# Patient Record
Sex: Female | Born: 1955
Health system: Southern US, Community
[De-identification: ages and names within clinical notes are randomized; demographics above are authoritative.]

## PROBLEM LIST (undated history)

## (undated) DIAGNOSIS — K219 Gastro-esophageal reflux disease without esophagitis: Secondary | ICD-10-CM

## (undated) DIAGNOSIS — Z9889 Other specified postprocedural states: Secondary | ICD-10-CM

## (undated) DIAGNOSIS — N3281 Overactive bladder: Secondary | ICD-10-CM

## (undated) DIAGNOSIS — G4700 Insomnia, unspecified: Secondary | ICD-10-CM

## (undated) DIAGNOSIS — Z8619 Personal history of other infectious and parasitic diseases: Secondary | ICD-10-CM

## (undated) DIAGNOSIS — E119 Type 2 diabetes mellitus without complications: Secondary | ICD-10-CM

## (undated) DIAGNOSIS — R51 Headache: Secondary | ICD-10-CM

## (undated) DIAGNOSIS — I509 Heart failure, unspecified: Secondary | ICD-10-CM

## (undated) DIAGNOSIS — K759 Inflammatory liver disease, unspecified: Secondary | ICD-10-CM

## (undated) DIAGNOSIS — R0600 Dyspnea, unspecified: Secondary | ICD-10-CM

## (undated) DIAGNOSIS — M797 Fibromyalgia: Secondary | ICD-10-CM

## (undated) DIAGNOSIS — F32A Depression, unspecified: Secondary | ICD-10-CM

## (undated) DIAGNOSIS — R011 Cardiac murmur, unspecified: Secondary | ICD-10-CM

## (undated) DIAGNOSIS — Z8614 Personal history of Methicillin resistant Staphylococcus aureus infection: Secondary | ICD-10-CM

## (undated) DIAGNOSIS — M1990 Unspecified osteoarthritis, unspecified site: Secondary | ICD-10-CM

## (undated) DIAGNOSIS — R519 Headache, unspecified: Secondary | ICD-10-CM

## (undated) DIAGNOSIS — J449 Chronic obstructive pulmonary disease, unspecified: Secondary | ICD-10-CM

## (undated) DIAGNOSIS — R112 Nausea with vomiting, unspecified: Secondary | ICD-10-CM

## (undated) DIAGNOSIS — J189 Pneumonia, unspecified organism: Secondary | ICD-10-CM

## (undated) DIAGNOSIS — F329 Major depressive disorder, single episode, unspecified: Secondary | ICD-10-CM

## (undated) DIAGNOSIS — F419 Anxiety disorder, unspecified: Secondary | ICD-10-CM

## (undated) DIAGNOSIS — D649 Anemia, unspecified: Secondary | ICD-10-CM

## (undated) HISTORY — DX: Type 2 diabetes mellitus without complications: E11.9

## (undated) HISTORY — PX: HEMORRHOID SURGERY: SHX153

## (undated) HISTORY — PX: FEMUR FRACTURE SURGERY: SHX633

## (undated) HISTORY — DX: Anxiety disorder, unspecified: F41.9

## (undated) HISTORY — PX: APPENDECTOMY: SHX54

## (undated) HISTORY — DX: Major depressive disorder, single episode, unspecified: F32.9

## (undated) HISTORY — DX: Heart failure, unspecified: I50.9

## (undated) HISTORY — DX: Personal history of other infectious and parasitic diseases: Z86.19

## (undated) HISTORY — DX: Insomnia, unspecified: G47.00

## (undated) HISTORY — DX: Overactive bladder: N32.81

## (undated) HISTORY — PX: OTHER SURGICAL HISTORY: SHX169

## (undated) HISTORY — PX: NASAL SINUS SURGERY: SHX719

## (undated) HISTORY — DX: Depression, unspecified: F32.A

## (undated) HISTORY — PX: INNER EAR SURGERY: SHX679

## (undated) HISTORY — DX: Personal history of Methicillin resistant Staphylococcus aureus infection: Z86.14

## (undated) HISTORY — PX: DIAGNOSTIC LAPAROSCOPY: SUR761

## (undated) HISTORY — PX: BACK SURGERY: SHX140

## (undated) HISTORY — DX: Chronic obstructive pulmonary disease, unspecified: J44.9

## (undated) HISTORY — DX: Gastro-esophageal reflux disease without esophagitis: K21.9

---

## 2016-09-15 DIAGNOSIS — J9691 Respiratory failure, unspecified with hypoxia: Secondary | ICD-10-CM | POA: Diagnosis not present

## 2016-09-15 DIAGNOSIS — J9602 Acute respiratory failure with hypercapnia: Secondary | ICD-10-CM | POA: Diagnosis not present

## 2016-09-15 DIAGNOSIS — J441 Chronic obstructive pulmonary disease with (acute) exacerbation: Secondary | ICD-10-CM | POA: Diagnosis not present

## 2016-09-15 DIAGNOSIS — R0600 Dyspnea, unspecified: Secondary | ICD-10-CM | POA: Diagnosis not present

## 2016-09-15 DIAGNOSIS — E871 Hypo-osmolality and hyponatremia: Secondary | ICD-10-CM | POA: Diagnosis not present

## 2016-09-15 DIAGNOSIS — K529 Noninfective gastroenteritis and colitis, unspecified: Secondary | ICD-10-CM | POA: Diagnosis not present

## 2016-09-15 DIAGNOSIS — N390 Urinary tract infection, site not specified: Secondary | ICD-10-CM | POA: Diagnosis not present

## 2016-09-16 DIAGNOSIS — T8451XA Infection and inflammatory reaction due to internal right hip prosthesis, initial encounter: Secondary | ICD-10-CM | POA: Diagnosis not present

## 2016-09-16 DIAGNOSIS — R1310 Dysphagia, unspecified: Secondary | ICD-10-CM | POA: Diagnosis present

## 2016-09-16 DIAGNOSIS — G9340 Encephalopathy, unspecified: Secondary | ICD-10-CM | POA: Diagnosis not present

## 2016-09-16 DIAGNOSIS — Z9889 Other specified postprocedural states: Secondary | ICD-10-CM | POA: Diagnosis not present

## 2016-09-16 DIAGNOSIS — D62 Acute posthemorrhagic anemia: Secondary | ICD-10-CM | POA: Diagnosis not present

## 2016-09-16 DIAGNOSIS — F17210 Nicotine dependence, cigarettes, uncomplicated: Secondary | ICD-10-CM | POA: Diagnosis present

## 2016-09-16 DIAGNOSIS — R4182 Altered mental status, unspecified: Secondary | ICD-10-CM | POA: Diagnosis present

## 2016-09-16 DIAGNOSIS — R1312 Dysphagia, oropharyngeal phase: Secondary | ICD-10-CM | POA: Diagnosis not present

## 2016-09-16 DIAGNOSIS — F329 Major depressive disorder, single episode, unspecified: Secondary | ICD-10-CM | POA: Diagnosis present

## 2016-09-16 DIAGNOSIS — R2689 Other abnormalities of gait and mobility: Secondary | ICD-10-CM | POA: Diagnosis not present

## 2016-09-16 DIAGNOSIS — R0600 Dyspnea, unspecified: Secondary | ICD-10-CM | POA: Diagnosis not present

## 2016-09-16 DIAGNOSIS — R279 Unspecified lack of coordination: Secondary | ICD-10-CM | POA: Diagnosis not present

## 2016-09-16 DIAGNOSIS — Z7409 Other reduced mobility: Secondary | ICD-10-CM | POA: Diagnosis not present

## 2016-09-16 DIAGNOSIS — M6281 Muscle weakness (generalized): Secondary | ICD-10-CM | POA: Diagnosis not present

## 2016-09-16 DIAGNOSIS — J309 Allergic rhinitis, unspecified: Secondary | ICD-10-CM | POA: Diagnosis not present

## 2016-09-16 DIAGNOSIS — J9601 Acute respiratory failure with hypoxia: Secondary | ICD-10-CM | POA: Diagnosis not present

## 2016-09-16 DIAGNOSIS — R262 Difficulty in walking, not elsewhere classified: Secondary | ICD-10-CM | POA: Diagnosis not present

## 2016-09-16 DIAGNOSIS — Z743 Need for continuous supervision: Secondary | ICD-10-CM | POA: Diagnosis not present

## 2016-09-16 DIAGNOSIS — J9691 Respiratory failure, unspecified with hypoxia: Secondary | ICD-10-CM | POA: Diagnosis present

## 2016-09-16 DIAGNOSIS — N390 Urinary tract infection, site not specified: Secondary | ICD-10-CM | POA: Diagnosis present

## 2016-09-16 DIAGNOSIS — E871 Hypo-osmolality and hyponatremia: Secondary | ICD-10-CM | POA: Diagnosis present

## 2016-09-16 DIAGNOSIS — F419 Anxiety disorder, unspecified: Secondary | ICD-10-CM | POA: Diagnosis present

## 2016-09-16 DIAGNOSIS — J9602 Acute respiratory failure with hypercapnia: Secondary | ICD-10-CM | POA: Diagnosis present

## 2016-09-16 DIAGNOSIS — A0471 Enterocolitis due to Clostridium difficile, recurrent: Secondary | ICD-10-CM | POA: Diagnosis not present

## 2016-09-16 DIAGNOSIS — K529 Noninfective gastroenteritis and colitis, unspecified: Secondary | ICD-10-CM | POA: Diagnosis present

## 2016-09-16 DIAGNOSIS — E876 Hypokalemia: Secondary | ICD-10-CM | POA: Diagnosis present

## 2016-09-16 DIAGNOSIS — N3289 Other specified disorders of bladder: Secondary | ICD-10-CM | POA: Diagnosis not present

## 2016-09-16 DIAGNOSIS — E8770 Fluid overload, unspecified: Secondary | ICD-10-CM | POA: Diagnosis present

## 2016-09-16 DIAGNOSIS — T814XXA Infection following a procedure, initial encounter: Secondary | ICD-10-CM | POA: Diagnosis not present

## 2016-09-16 DIAGNOSIS — J189 Pneumonia, unspecified organism: Secondary | ICD-10-CM | POA: Diagnosis not present

## 2016-09-16 DIAGNOSIS — M25551 Pain in right hip: Secondary | ICD-10-CM | POA: Diagnosis not present

## 2016-09-16 DIAGNOSIS — J441 Chronic obstructive pulmonary disease with (acute) exacerbation: Secondary | ICD-10-CM | POA: Diagnosis present

## 2016-09-16 DIAGNOSIS — I509 Heart failure, unspecified: Secondary | ICD-10-CM | POA: Diagnosis not present

## 2016-09-16 DIAGNOSIS — F339 Major depressive disorder, recurrent, unspecified: Secondary | ICD-10-CM | POA: Diagnosis not present

## 2016-09-16 DIAGNOSIS — R293 Abnormal posture: Secondary | ICD-10-CM | POA: Diagnosis not present

## 2016-09-16 DIAGNOSIS — M62838 Other muscle spasm: Secondary | ICD-10-CM | POA: Diagnosis not present

## 2016-09-16 DIAGNOSIS — G4700 Insomnia, unspecified: Secondary | ICD-10-CM | POA: Diagnosis not present

## 2016-09-16 DIAGNOSIS — Z741 Need for assistance with personal care: Secondary | ICD-10-CM | POA: Diagnosis not present

## 2016-09-16 DIAGNOSIS — R278 Other lack of coordination: Secondary | ICD-10-CM | POA: Diagnosis not present

## 2016-09-16 DIAGNOSIS — G8929 Other chronic pain: Secondary | ICD-10-CM | POA: Diagnosis present

## 2016-09-16 DIAGNOSIS — K219 Gastro-esophageal reflux disease without esophagitis: Secondary | ICD-10-CM | POA: Diagnosis present

## 2016-09-16 DIAGNOSIS — Z8614 Personal history of Methicillin resistant Staphylococcus aureus infection: Secondary | ICD-10-CM | POA: Diagnosis not present

## 2016-09-21 DIAGNOSIS — Z7409 Other reduced mobility: Secondary | ICD-10-CM | POA: Diagnosis not present

## 2016-09-21 DIAGNOSIS — R1312 Dysphagia, oropharyngeal phase: Secondary | ICD-10-CM | POA: Diagnosis not present

## 2016-09-21 DIAGNOSIS — F339 Major depressive disorder, recurrent, unspecified: Secondary | ICD-10-CM | POA: Diagnosis not present

## 2016-09-21 DIAGNOSIS — R262 Difficulty in walking, not elsewhere classified: Secondary | ICD-10-CM | POA: Diagnosis not present

## 2016-09-21 DIAGNOSIS — G4700 Insomnia, unspecified: Secondary | ICD-10-CM | POA: Diagnosis not present

## 2016-09-21 DIAGNOSIS — T8451XA Infection and inflammatory reaction due to internal right hip prosthesis, initial encounter: Secondary | ICD-10-CM | POA: Diagnosis not present

## 2016-09-21 DIAGNOSIS — A0471 Enterocolitis due to Clostridium difficile, recurrent: Secondary | ICD-10-CM | POA: Diagnosis not present

## 2016-09-21 DIAGNOSIS — M6281 Muscle weakness (generalized): Secondary | ICD-10-CM | POA: Diagnosis not present

## 2016-09-21 DIAGNOSIS — J189 Pneumonia, unspecified organism: Secondary | ICD-10-CM | POA: Diagnosis not present

## 2016-09-21 DIAGNOSIS — E876 Hypokalemia: Secondary | ICD-10-CM | POA: Diagnosis not present

## 2016-09-21 DIAGNOSIS — M62838 Other muscle spasm: Secondary | ICD-10-CM | POA: Diagnosis not present

## 2016-09-21 DIAGNOSIS — R279 Unspecified lack of coordination: Secondary | ICD-10-CM | POA: Diagnosis not present

## 2016-09-21 DIAGNOSIS — J9601 Acute respiratory failure with hypoxia: Secondary | ICD-10-CM | POA: Diagnosis not present

## 2016-09-21 DIAGNOSIS — D62 Acute posthemorrhagic anemia: Secondary | ICD-10-CM | POA: Diagnosis not present

## 2016-09-21 DIAGNOSIS — M25551 Pain in right hip: Secondary | ICD-10-CM | POA: Diagnosis not present

## 2016-09-21 DIAGNOSIS — Z743 Need for continuous supervision: Secondary | ICD-10-CM | POA: Diagnosis not present

## 2016-09-21 DIAGNOSIS — Z741 Need for assistance with personal care: Secondary | ICD-10-CM | POA: Diagnosis not present

## 2016-09-21 DIAGNOSIS — N3289 Other specified disorders of bladder: Secondary | ICD-10-CM | POA: Diagnosis not present

## 2016-09-21 DIAGNOSIS — F419 Anxiety disorder, unspecified: Secondary | ICD-10-CM | POA: Diagnosis not present

## 2016-09-21 DIAGNOSIS — R293 Abnormal posture: Secondary | ICD-10-CM | POA: Diagnosis not present

## 2016-09-21 DIAGNOSIS — G9340 Encephalopathy, unspecified: Secondary | ICD-10-CM | POA: Diagnosis not present

## 2016-09-21 DIAGNOSIS — R1310 Dysphagia, unspecified: Secondary | ICD-10-CM | POA: Diagnosis not present

## 2016-09-21 DIAGNOSIS — I509 Heart failure, unspecified: Secondary | ICD-10-CM | POA: Diagnosis not present

## 2016-09-21 DIAGNOSIS — G8929 Other chronic pain: Secondary | ICD-10-CM | POA: Diagnosis not present

## 2016-09-21 DIAGNOSIS — K219 Gastro-esophageal reflux disease without esophagitis: Secondary | ICD-10-CM | POA: Diagnosis not present

## 2016-09-21 DIAGNOSIS — J441 Chronic obstructive pulmonary disease with (acute) exacerbation: Secondary | ICD-10-CM | POA: Diagnosis not present

## 2016-09-21 DIAGNOSIS — T814XXA Infection following a procedure, initial encounter: Secondary | ICD-10-CM | POA: Diagnosis not present

## 2016-09-21 DIAGNOSIS — R278 Other lack of coordination: Secondary | ICD-10-CM | POA: Diagnosis not present

## 2016-09-21 DIAGNOSIS — R2689 Other abnormalities of gait and mobility: Secondary | ICD-10-CM | POA: Diagnosis not present

## 2016-09-21 DIAGNOSIS — J309 Allergic rhinitis, unspecified: Secondary | ICD-10-CM | POA: Diagnosis not present

## 2016-09-26 DIAGNOSIS — M6281 Muscle weakness (generalized): Secondary | ICD-10-CM | POA: Diagnosis not present

## 2016-10-17 DIAGNOSIS — K219 Gastro-esophageal reflux disease without esophagitis: Secondary | ICD-10-CM | POA: Diagnosis not present

## 2016-10-17 DIAGNOSIS — R0602 Shortness of breath: Secondary | ICD-10-CM | POA: Diagnosis not present

## 2016-10-17 DIAGNOSIS — F331 Major depressive disorder, recurrent, moderate: Secondary | ICD-10-CM | POA: Diagnosis not present

## 2016-10-17 DIAGNOSIS — D649 Anemia, unspecified: Secondary | ICD-10-CM | POA: Diagnosis not present

## 2016-10-17 DIAGNOSIS — G8929 Other chronic pain: Secondary | ICD-10-CM | POA: Diagnosis not present

## 2016-10-17 DIAGNOSIS — F5101 Primary insomnia: Secondary | ICD-10-CM | POA: Diagnosis not present

## 2016-10-17 DIAGNOSIS — R112 Nausea with vomiting, unspecified: Secondary | ICD-10-CM | POA: Diagnosis not present

## 2016-10-17 DIAGNOSIS — M62838 Other muscle spasm: Secondary | ICD-10-CM | POA: Diagnosis not present

## 2016-10-17 DIAGNOSIS — F419 Anxiety disorder, unspecified: Secondary | ICD-10-CM | POA: Diagnosis not present

## 2016-10-17 DIAGNOSIS — R609 Edema, unspecified: Secondary | ICD-10-CM | POA: Diagnosis not present

## 2016-10-23 DIAGNOSIS — G894 Chronic pain syndrome: Secondary | ICD-10-CM | POA: Diagnosis not present

## 2016-10-30 DIAGNOSIS — G894 Chronic pain syndrome: Secondary | ICD-10-CM | POA: Diagnosis not present

## 2016-11-01 DIAGNOSIS — F331 Major depressive disorder, recurrent, moderate: Secondary | ICD-10-CM | POA: Diagnosis not present

## 2016-11-01 DIAGNOSIS — F17210 Nicotine dependence, cigarettes, uncomplicated: Secondary | ICD-10-CM | POA: Diagnosis not present

## 2016-11-01 DIAGNOSIS — F5101 Primary insomnia: Secondary | ICD-10-CM | POA: Diagnosis not present

## 2016-11-01 DIAGNOSIS — F419 Anxiety disorder, unspecified: Secondary | ICD-10-CM | POA: Diagnosis not present

## 2016-11-02 DIAGNOSIS — H906 Mixed conductive and sensorineural hearing loss, bilateral: Secondary | ICD-10-CM | POA: Diagnosis not present

## 2016-11-02 DIAGNOSIS — J310 Chronic rhinitis: Secondary | ICD-10-CM | POA: Diagnosis not present

## 2016-11-02 DIAGNOSIS — H6593 Unspecified nonsuppurative otitis media, bilateral: Secondary | ICD-10-CM | POA: Diagnosis not present

## 2016-11-02 DIAGNOSIS — H9203 Otalgia, bilateral: Secondary | ICD-10-CM | POA: Diagnosis not present

## 2016-11-05 DIAGNOSIS — G894 Chronic pain syndrome: Secondary | ICD-10-CM | POA: Diagnosis not present

## 2016-11-13 DIAGNOSIS — F17210 Nicotine dependence, cigarettes, uncomplicated: Secondary | ICD-10-CM | POA: Diagnosis not present

## 2016-11-13 DIAGNOSIS — G8929 Other chronic pain: Secondary | ICD-10-CM | POA: Diagnosis not present

## 2016-11-13 DIAGNOSIS — R609 Edema, unspecified: Secondary | ICD-10-CM | POA: Diagnosis not present

## 2016-11-13 DIAGNOSIS — R112 Nausea with vomiting, unspecified: Secondary | ICD-10-CM | POA: Diagnosis not present

## 2016-11-13 DIAGNOSIS — F5101 Primary insomnia: Secondary | ICD-10-CM | POA: Diagnosis not present

## 2016-11-13 DIAGNOSIS — M62838 Other muscle spasm: Secondary | ICD-10-CM | POA: Diagnosis not present

## 2016-11-13 DIAGNOSIS — D649 Anemia, unspecified: Secondary | ICD-10-CM | POA: Diagnosis not present

## 2016-11-13 DIAGNOSIS — F331 Major depressive disorder, recurrent, moderate: Secondary | ICD-10-CM | POA: Diagnosis not present

## 2016-11-13 DIAGNOSIS — F419 Anxiety disorder, unspecified: Secondary | ICD-10-CM | POA: Diagnosis not present

## 2016-11-13 DIAGNOSIS — K219 Gastro-esophageal reflux disease without esophagitis: Secondary | ICD-10-CM | POA: Diagnosis not present

## 2016-11-13 DIAGNOSIS — R0602 Shortness of breath: Secondary | ICD-10-CM | POA: Diagnosis not present

## 2016-11-14 DIAGNOSIS — R0600 Dyspnea, unspecified: Secondary | ICD-10-CM | POA: Diagnosis not present

## 2016-11-27 DIAGNOSIS — G894 Chronic pain syndrome: Secondary | ICD-10-CM | POA: Diagnosis not present

## 2016-12-07 DIAGNOSIS — G894 Chronic pain syndrome: Secondary | ICD-10-CM | POA: Diagnosis not present

## 2016-12-13 DIAGNOSIS — R0602 Shortness of breath: Secondary | ICD-10-CM | POA: Diagnosis not present

## 2016-12-13 DIAGNOSIS — F321 Major depressive disorder, single episode, moderate: Secondary | ICD-10-CM | POA: Diagnosis not present

## 2016-12-13 DIAGNOSIS — F331 Major depressive disorder, recurrent, moderate: Secondary | ICD-10-CM | POA: Diagnosis not present

## 2016-12-13 DIAGNOSIS — F419 Anxiety disorder, unspecified: Secondary | ICD-10-CM | POA: Diagnosis not present

## 2016-12-13 DIAGNOSIS — K219 Gastro-esophageal reflux disease without esophagitis: Secondary | ICD-10-CM | POA: Diagnosis not present

## 2016-12-13 DIAGNOSIS — G8929 Other chronic pain: Secondary | ICD-10-CM | POA: Diagnosis not present

## 2016-12-13 DIAGNOSIS — M62838 Other muscle spasm: Secondary | ICD-10-CM | POA: Diagnosis not present

## 2016-12-13 DIAGNOSIS — J441 Chronic obstructive pulmonary disease with (acute) exacerbation: Secondary | ICD-10-CM | POA: Diagnosis not present

## 2016-12-13 DIAGNOSIS — J4521 Mild intermittent asthma with (acute) exacerbation: Secondary | ICD-10-CM | POA: Diagnosis not present

## 2016-12-13 DIAGNOSIS — D649 Anemia, unspecified: Secondary | ICD-10-CM | POA: Diagnosis not present

## 2016-12-13 DIAGNOSIS — F5101 Primary insomnia: Secondary | ICD-10-CM | POA: Diagnosis not present

## 2016-12-13 DIAGNOSIS — F17210 Nicotine dependence, cigarettes, uncomplicated: Secondary | ICD-10-CM | POA: Diagnosis not present

## 2016-12-14 DIAGNOSIS — H906 Mixed conductive and sensorineural hearing loss, bilateral: Secondary | ICD-10-CM | POA: Diagnosis not present

## 2016-12-14 DIAGNOSIS — J310 Chronic rhinitis: Secondary | ICD-10-CM | POA: Diagnosis not present

## 2016-12-14 DIAGNOSIS — H6593 Unspecified nonsuppurative otitis media, bilateral: Secondary | ICD-10-CM | POA: Diagnosis not present

## 2016-12-14 DIAGNOSIS — H9203 Otalgia, bilateral: Secondary | ICD-10-CM | POA: Diagnosis not present

## 2016-12-21 DIAGNOSIS — G894 Chronic pain syndrome: Secondary | ICD-10-CM | POA: Diagnosis not present

## 2016-12-27 DIAGNOSIS — H9203 Otalgia, bilateral: Secondary | ICD-10-CM | POA: Diagnosis not present

## 2016-12-27 DIAGNOSIS — H6983 Other specified disorders of Eustachian tube, bilateral: Secondary | ICD-10-CM | POA: Diagnosis not present

## 2016-12-27 DIAGNOSIS — Z720 Tobacco use: Secondary | ICD-10-CM | POA: Diagnosis not present

## 2017-01-01 DIAGNOSIS — G894 Chronic pain syndrome: Secondary | ICD-10-CM | POA: Diagnosis not present

## 2017-01-07 DIAGNOSIS — Z79899 Other long term (current) drug therapy: Secondary | ICD-10-CM | POA: Diagnosis not present

## 2017-01-12 DIAGNOSIS — R0602 Shortness of breath: Secondary | ICD-10-CM | POA: Diagnosis not present

## 2017-01-12 DIAGNOSIS — M62838 Other muscle spasm: Secondary | ICD-10-CM | POA: Diagnosis not present

## 2017-01-12 DIAGNOSIS — F331 Major depressive disorder, recurrent, moderate: Secondary | ICD-10-CM | POA: Diagnosis not present

## 2017-01-12 DIAGNOSIS — J4521 Mild intermittent asthma with (acute) exacerbation: Secondary | ICD-10-CM | POA: Diagnosis not present

## 2017-01-12 DIAGNOSIS — G8929 Other chronic pain: Secondary | ICD-10-CM | POA: Diagnosis not present

## 2017-01-12 DIAGNOSIS — D649 Anemia, unspecified: Secondary | ICD-10-CM | POA: Diagnosis not present

## 2017-01-12 DIAGNOSIS — F5101 Primary insomnia: Secondary | ICD-10-CM | POA: Diagnosis not present

## 2017-01-12 DIAGNOSIS — F321 Major depressive disorder, single episode, moderate: Secondary | ICD-10-CM | POA: Diagnosis not present

## 2017-01-12 DIAGNOSIS — K219 Gastro-esophageal reflux disease without esophagitis: Secondary | ICD-10-CM | POA: Diagnosis not present

## 2017-01-12 DIAGNOSIS — J441 Chronic obstructive pulmonary disease with (acute) exacerbation: Secondary | ICD-10-CM | POA: Diagnosis not present

## 2017-01-12 DIAGNOSIS — F17210 Nicotine dependence, cigarettes, uncomplicated: Secondary | ICD-10-CM | POA: Diagnosis not present

## 2017-01-12 DIAGNOSIS — F419 Anxiety disorder, unspecified: Secondary | ICD-10-CM | POA: Diagnosis not present

## 2017-01-16 DIAGNOSIS — G894 Chronic pain syndrome: Secondary | ICD-10-CM | POA: Diagnosis not present

## 2017-01-29 DIAGNOSIS — G894 Chronic pain syndrome: Secondary | ICD-10-CM | POA: Diagnosis not present

## 2017-02-07 DIAGNOSIS — F419 Anxiety disorder, unspecified: Secondary | ICD-10-CM | POA: Diagnosis not present

## 2017-02-07 DIAGNOSIS — F5101 Primary insomnia: Secondary | ICD-10-CM | POA: Diagnosis not present

## 2017-02-07 DIAGNOSIS — F17210 Nicotine dependence, cigarettes, uncomplicated: Secondary | ICD-10-CM | POA: Diagnosis not present

## 2017-02-07 DIAGNOSIS — F331 Major depressive disorder, recurrent, moderate: Secondary | ICD-10-CM | POA: Diagnosis not present

## 2017-02-13 DIAGNOSIS — F419 Anxiety disorder, unspecified: Secondary | ICD-10-CM | POA: Diagnosis not present

## 2017-02-13 DIAGNOSIS — F17210 Nicotine dependence, cigarettes, uncomplicated: Secondary | ICD-10-CM | POA: Diagnosis not present

## 2017-02-13 DIAGNOSIS — M62838 Other muscle spasm: Secondary | ICD-10-CM | POA: Diagnosis not present

## 2017-02-13 DIAGNOSIS — F331 Major depressive disorder, recurrent, moderate: Secondary | ICD-10-CM | POA: Diagnosis not present

## 2017-02-13 DIAGNOSIS — G8929 Other chronic pain: Secondary | ICD-10-CM | POA: Diagnosis not present

## 2017-02-13 DIAGNOSIS — R0602 Shortness of breath: Secondary | ICD-10-CM | POA: Diagnosis not present

## 2017-02-13 DIAGNOSIS — F321 Major depressive disorder, single episode, moderate: Secondary | ICD-10-CM | POA: Diagnosis not present

## 2017-02-13 DIAGNOSIS — J4521 Mild intermittent asthma with (acute) exacerbation: Secondary | ICD-10-CM | POA: Diagnosis not present

## 2017-02-13 DIAGNOSIS — K219 Gastro-esophageal reflux disease without esophagitis: Secondary | ICD-10-CM | POA: Diagnosis not present

## 2017-02-13 DIAGNOSIS — F5101 Primary insomnia: Secondary | ICD-10-CM | POA: Diagnosis not present

## 2017-02-13 DIAGNOSIS — J441 Chronic obstructive pulmonary disease with (acute) exacerbation: Secondary | ICD-10-CM | POA: Diagnosis not present

## 2017-02-13 DIAGNOSIS — D649 Anemia, unspecified: Secondary | ICD-10-CM | POA: Diagnosis not present

## 2017-02-25 DIAGNOSIS — G894 Chronic pain syndrome: Secondary | ICD-10-CM | POA: Diagnosis not present

## 2017-03-08 DIAGNOSIS — M6281 Muscle weakness (generalized): Secondary | ICD-10-CM | POA: Diagnosis not present

## 2017-03-14 DIAGNOSIS — G8929 Other chronic pain: Secondary | ICD-10-CM | POA: Diagnosis not present

## 2017-03-14 DIAGNOSIS — F331 Major depressive disorder, recurrent, moderate: Secondary | ICD-10-CM | POA: Diagnosis not present

## 2017-03-14 DIAGNOSIS — J4521 Mild intermittent asthma with (acute) exacerbation: Secondary | ICD-10-CM | POA: Diagnosis not present

## 2017-03-14 DIAGNOSIS — F419 Anxiety disorder, unspecified: Secondary | ICD-10-CM | POA: Diagnosis not present

## 2017-03-14 DIAGNOSIS — F17210 Nicotine dependence, cigarettes, uncomplicated: Secondary | ICD-10-CM | POA: Diagnosis not present

## 2017-03-14 DIAGNOSIS — K219 Gastro-esophageal reflux disease without esophagitis: Secondary | ICD-10-CM | POA: Diagnosis not present

## 2017-03-14 DIAGNOSIS — F321 Major depressive disorder, single episode, moderate: Secondary | ICD-10-CM | POA: Diagnosis not present

## 2017-03-14 DIAGNOSIS — J441 Chronic obstructive pulmonary disease with (acute) exacerbation: Secondary | ICD-10-CM | POA: Diagnosis not present

## 2017-03-14 DIAGNOSIS — R0602 Shortness of breath: Secondary | ICD-10-CM | POA: Diagnosis not present

## 2017-03-14 DIAGNOSIS — D649 Anemia, unspecified: Secondary | ICD-10-CM | POA: Diagnosis not present

## 2017-03-14 DIAGNOSIS — F5101 Primary insomnia: Secondary | ICD-10-CM | POA: Diagnosis not present

## 2017-03-14 DIAGNOSIS — M62838 Other muscle spasm: Secondary | ICD-10-CM | POA: Diagnosis not present

## 2017-03-29 DIAGNOSIS — F5101 Primary insomnia: Secondary | ICD-10-CM | POA: Diagnosis not present

## 2017-03-29 DIAGNOSIS — F17210 Nicotine dependence, cigarettes, uncomplicated: Secondary | ICD-10-CM | POA: Diagnosis not present

## 2017-03-29 DIAGNOSIS — F331 Major depressive disorder, recurrent, moderate: Secondary | ICD-10-CM | POA: Diagnosis not present

## 2017-03-29 DIAGNOSIS — F419 Anxiety disorder, unspecified: Secondary | ICD-10-CM | POA: Diagnosis not present

## 2017-04-02 DIAGNOSIS — D509 Iron deficiency anemia, unspecified: Secondary | ICD-10-CM | POA: Diagnosis not present

## 2017-04-02 DIAGNOSIS — Z79899 Other long term (current) drug therapy: Secondary | ICD-10-CM | POA: Diagnosis not present

## 2017-04-04 DIAGNOSIS — F5101 Primary insomnia: Secondary | ICD-10-CM | POA: Diagnosis not present

## 2017-04-04 DIAGNOSIS — F331 Major depressive disorder, recurrent, moderate: Secondary | ICD-10-CM | POA: Diagnosis not present

## 2017-04-04 DIAGNOSIS — F17210 Nicotine dependence, cigarettes, uncomplicated: Secondary | ICD-10-CM | POA: Diagnosis not present

## 2017-04-04 DIAGNOSIS — F419 Anxiety disorder, unspecified: Secondary | ICD-10-CM | POA: Diagnosis not present

## 2017-04-12 DIAGNOSIS — F331 Major depressive disorder, recurrent, moderate: Secondary | ICD-10-CM | POA: Diagnosis not present

## 2017-04-12 DIAGNOSIS — F419 Anxiety disorder, unspecified: Secondary | ICD-10-CM | POA: Diagnosis not present

## 2017-04-12 DIAGNOSIS — F5101 Primary insomnia: Secondary | ICD-10-CM | POA: Diagnosis not present

## 2017-04-12 DIAGNOSIS — F17210 Nicotine dependence, cigarettes, uncomplicated: Secondary | ICD-10-CM | POA: Diagnosis not present

## 2017-04-15 DIAGNOSIS — F17210 Nicotine dependence, cigarettes, uncomplicated: Secondary | ICD-10-CM | POA: Diagnosis not present

## 2017-04-15 DIAGNOSIS — F321 Major depressive disorder, single episode, moderate: Secondary | ICD-10-CM | POA: Diagnosis not present

## 2017-04-15 DIAGNOSIS — R0602 Shortness of breath: Secondary | ICD-10-CM | POA: Diagnosis not present

## 2017-04-15 DIAGNOSIS — K219 Gastro-esophageal reflux disease without esophagitis: Secondary | ICD-10-CM | POA: Diagnosis not present

## 2017-04-15 DIAGNOSIS — J4521 Mild intermittent asthma with (acute) exacerbation: Secondary | ICD-10-CM | POA: Diagnosis not present

## 2017-04-15 DIAGNOSIS — F331 Major depressive disorder, recurrent, moderate: Secondary | ICD-10-CM | POA: Diagnosis not present

## 2017-04-15 DIAGNOSIS — J441 Chronic obstructive pulmonary disease with (acute) exacerbation: Secondary | ICD-10-CM | POA: Diagnosis not present

## 2017-04-15 DIAGNOSIS — F419 Anxiety disorder, unspecified: Secondary | ICD-10-CM | POA: Diagnosis not present

## 2017-04-15 DIAGNOSIS — M62838 Other muscle spasm: Secondary | ICD-10-CM | POA: Diagnosis not present

## 2017-04-15 DIAGNOSIS — G8929 Other chronic pain: Secondary | ICD-10-CM | POA: Diagnosis not present

## 2017-04-15 DIAGNOSIS — F5101 Primary insomnia: Secondary | ICD-10-CM | POA: Diagnosis not present

## 2017-04-15 DIAGNOSIS — D649 Anemia, unspecified: Secondary | ICD-10-CM | POA: Diagnosis not present

## 2017-04-19 DIAGNOSIS — F5101 Primary insomnia: Secondary | ICD-10-CM | POA: Diagnosis not present

## 2017-04-19 DIAGNOSIS — F419 Anxiety disorder, unspecified: Secondary | ICD-10-CM | POA: Diagnosis not present

## 2017-04-19 DIAGNOSIS — F331 Major depressive disorder, recurrent, moderate: Secondary | ICD-10-CM | POA: Diagnosis not present

## 2017-04-19 DIAGNOSIS — F17210 Nicotine dependence, cigarettes, uncomplicated: Secondary | ICD-10-CM | POA: Diagnosis not present

## 2017-04-25 DIAGNOSIS — N390 Urinary tract infection, site not specified: Secondary | ICD-10-CM | POA: Diagnosis not present

## 2017-04-25 DIAGNOSIS — R300 Dysuria: Secondary | ICD-10-CM | POA: Diagnosis not present

## 2017-04-26 DIAGNOSIS — F419 Anxiety disorder, unspecified: Secondary | ICD-10-CM | POA: Diagnosis not present

## 2017-04-26 DIAGNOSIS — F5101 Primary insomnia: Secondary | ICD-10-CM | POA: Diagnosis not present

## 2017-04-26 DIAGNOSIS — F331 Major depressive disorder, recurrent, moderate: Secondary | ICD-10-CM | POA: Diagnosis not present

## 2017-04-26 DIAGNOSIS — F17210 Nicotine dependence, cigarettes, uncomplicated: Secondary | ICD-10-CM | POA: Diagnosis not present

## 2017-05-07 DIAGNOSIS — H903 Sensorineural hearing loss, bilateral: Secondary | ICD-10-CM | POA: Diagnosis not present

## 2017-05-07 DIAGNOSIS — H6983 Other specified disorders of Eustachian tube, bilateral: Secondary | ICD-10-CM | POA: Diagnosis not present

## 2017-05-07 DIAGNOSIS — H9203 Otalgia, bilateral: Secondary | ICD-10-CM | POA: Diagnosis not present

## 2017-05-09 DIAGNOSIS — N390 Urinary tract infection, site not specified: Secondary | ICD-10-CM | POA: Diagnosis not present

## 2017-05-10 DIAGNOSIS — F5101 Primary insomnia: Secondary | ICD-10-CM | POA: Diagnosis not present

## 2017-05-10 DIAGNOSIS — F17210 Nicotine dependence, cigarettes, uncomplicated: Secondary | ICD-10-CM | POA: Diagnosis not present

## 2017-05-10 DIAGNOSIS — F331 Major depressive disorder, recurrent, moderate: Secondary | ICD-10-CM | POA: Diagnosis not present

## 2017-05-10 DIAGNOSIS — F419 Anxiety disorder, unspecified: Secondary | ICD-10-CM | POA: Diagnosis not present

## 2017-05-16 DIAGNOSIS — D649 Anemia, unspecified: Secondary | ICD-10-CM | POA: Diagnosis not present

## 2017-05-16 DIAGNOSIS — J4521 Mild intermittent asthma with (acute) exacerbation: Secondary | ICD-10-CM | POA: Diagnosis not present

## 2017-05-16 DIAGNOSIS — K219 Gastro-esophageal reflux disease without esophagitis: Secondary | ICD-10-CM | POA: Diagnosis not present

## 2017-05-16 DIAGNOSIS — F5101 Primary insomnia: Secondary | ICD-10-CM | POA: Diagnosis not present

## 2017-05-16 DIAGNOSIS — F331 Major depressive disorder, recurrent, moderate: Secondary | ICD-10-CM | POA: Diagnosis not present

## 2017-05-16 DIAGNOSIS — F419 Anxiety disorder, unspecified: Secondary | ICD-10-CM | POA: Diagnosis not present

## 2017-05-16 DIAGNOSIS — G8929 Other chronic pain: Secondary | ICD-10-CM | POA: Diagnosis not present

## 2017-05-16 DIAGNOSIS — M62838 Other muscle spasm: Secondary | ICD-10-CM | POA: Diagnosis not present

## 2017-05-16 DIAGNOSIS — F17210 Nicotine dependence, cigarettes, uncomplicated: Secondary | ICD-10-CM | POA: Diagnosis not present

## 2017-05-16 DIAGNOSIS — F321 Major depressive disorder, single episode, moderate: Secondary | ICD-10-CM | POA: Diagnosis not present

## 2017-05-16 DIAGNOSIS — J441 Chronic obstructive pulmonary disease with (acute) exacerbation: Secondary | ICD-10-CM | POA: Diagnosis not present

## 2017-05-16 DIAGNOSIS — R0602 Shortness of breath: Secondary | ICD-10-CM | POA: Diagnosis not present

## 2017-05-17 DIAGNOSIS — F331 Major depressive disorder, recurrent, moderate: Secondary | ICD-10-CM | POA: Diagnosis not present

## 2017-05-17 DIAGNOSIS — F5101 Primary insomnia: Secondary | ICD-10-CM | POA: Diagnosis not present

## 2017-05-17 DIAGNOSIS — F17210 Nicotine dependence, cigarettes, uncomplicated: Secondary | ICD-10-CM | POA: Diagnosis not present

## 2017-05-17 DIAGNOSIS — F419 Anxiety disorder, unspecified: Secondary | ICD-10-CM | POA: Diagnosis not present

## 2017-05-21 DIAGNOSIS — F419 Anxiety disorder, unspecified: Secondary | ICD-10-CM | POA: Diagnosis not present

## 2017-05-21 DIAGNOSIS — F17210 Nicotine dependence, cigarettes, uncomplicated: Secondary | ICD-10-CM | POA: Diagnosis not present

## 2017-05-21 DIAGNOSIS — F331 Major depressive disorder, recurrent, moderate: Secondary | ICD-10-CM | POA: Diagnosis not present

## 2017-05-21 DIAGNOSIS — F5101 Primary insomnia: Secondary | ICD-10-CM | POA: Diagnosis not present

## 2017-06-04 DIAGNOSIS — K219 Gastro-esophageal reflux disease without esophagitis: Secondary | ICD-10-CM | POA: Diagnosis not present

## 2017-06-04 DIAGNOSIS — N3281 Overactive bladder: Secondary | ICD-10-CM | POA: Diagnosis not present

## 2017-06-04 DIAGNOSIS — Q845 Enlarged and hypertrophic nails: Secondary | ICD-10-CM | POA: Diagnosis not present

## 2017-06-04 DIAGNOSIS — R609 Edema, unspecified: Secondary | ICD-10-CM | POA: Diagnosis not present

## 2017-06-04 DIAGNOSIS — M79671 Pain in right foot: Secondary | ICD-10-CM | POA: Diagnosis not present

## 2017-06-04 DIAGNOSIS — M79672 Pain in left foot: Secondary | ICD-10-CM | POA: Diagnosis not present

## 2017-06-04 DIAGNOSIS — R110 Nausea: Secondary | ICD-10-CM | POA: Diagnosis not present

## 2017-06-04 DIAGNOSIS — L603 Nail dystrophy: Secondary | ICD-10-CM | POA: Diagnosis not present

## 2017-06-04 DIAGNOSIS — G894 Chronic pain syndrome: Secondary | ICD-10-CM | POA: Diagnosis not present

## 2017-06-04 DIAGNOSIS — R05 Cough: Secondary | ICD-10-CM | POA: Diagnosis not present

## 2017-06-04 DIAGNOSIS — B351 Tinea unguium: Secondary | ICD-10-CM | POA: Diagnosis not present

## 2017-06-04 DIAGNOSIS — J309 Allergic rhinitis, unspecified: Secondary | ICD-10-CM | POA: Diagnosis not present

## 2017-06-04 DIAGNOSIS — F17210 Nicotine dependence, cigarettes, uncomplicated: Secondary | ICD-10-CM | POA: Diagnosis not present

## 2017-06-04 DIAGNOSIS — I509 Heart failure, unspecified: Secondary | ICD-10-CM | POA: Diagnosis not present

## 2017-06-04 DIAGNOSIS — R269 Unspecified abnormalities of gait and mobility: Secondary | ICD-10-CM | POA: Diagnosis not present

## 2017-06-04 DIAGNOSIS — R600 Localized edema: Secondary | ICD-10-CM | POA: Diagnosis not present

## 2017-06-04 DIAGNOSIS — J449 Chronic obstructive pulmonary disease, unspecified: Secondary | ICD-10-CM | POA: Diagnosis not present

## 2017-06-12 DIAGNOSIS — R0602 Shortness of breath: Secondary | ICD-10-CM | POA: Diagnosis not present

## 2017-06-12 DIAGNOSIS — F331 Major depressive disorder, recurrent, moderate: Secondary | ICD-10-CM | POA: Diagnosis not present

## 2017-06-12 DIAGNOSIS — D649 Anemia, unspecified: Secondary | ICD-10-CM | POA: Diagnosis not present

## 2017-06-12 DIAGNOSIS — G8929 Other chronic pain: Secondary | ICD-10-CM | POA: Diagnosis not present

## 2017-06-12 DIAGNOSIS — F5101 Primary insomnia: Secondary | ICD-10-CM | POA: Diagnosis not present

## 2017-06-12 DIAGNOSIS — F17210 Nicotine dependence, cigarettes, uncomplicated: Secondary | ICD-10-CM | POA: Diagnosis not present

## 2017-06-12 DIAGNOSIS — K219 Gastro-esophageal reflux disease without esophagitis: Secondary | ICD-10-CM | POA: Diagnosis not present

## 2017-06-12 DIAGNOSIS — F419 Anxiety disorder, unspecified: Secondary | ICD-10-CM | POA: Diagnosis not present

## 2017-06-12 DIAGNOSIS — F321 Major depressive disorder, single episode, moderate: Secondary | ICD-10-CM | POA: Diagnosis not present

## 2017-06-12 DIAGNOSIS — J4521 Mild intermittent asthma with (acute) exacerbation: Secondary | ICD-10-CM | POA: Diagnosis not present

## 2017-06-12 DIAGNOSIS — M62838 Other muscle spasm: Secondary | ICD-10-CM | POA: Diagnosis not present

## 2017-06-12 DIAGNOSIS — J441 Chronic obstructive pulmonary disease with (acute) exacerbation: Secondary | ICD-10-CM | POA: Diagnosis not present

## 2017-06-13 DIAGNOSIS — Z7409 Other reduced mobility: Secondary | ICD-10-CM | POA: Diagnosis not present

## 2017-06-13 DIAGNOSIS — F17210 Nicotine dependence, cigarettes, uncomplicated: Secondary | ICD-10-CM | POA: Diagnosis not present

## 2017-06-13 DIAGNOSIS — F5101 Primary insomnia: Secondary | ICD-10-CM | POA: Diagnosis not present

## 2017-06-13 DIAGNOSIS — F331 Major depressive disorder, recurrent, moderate: Secondary | ICD-10-CM | POA: Diagnosis not present

## 2017-06-13 DIAGNOSIS — R2689 Other abnormalities of gait and mobility: Secondary | ICD-10-CM | POA: Diagnosis not present

## 2017-06-13 DIAGNOSIS — F419 Anxiety disorder, unspecified: Secondary | ICD-10-CM | POA: Diagnosis not present

## 2017-06-13 DIAGNOSIS — A419 Sepsis, unspecified organism: Secondary | ICD-10-CM | POA: Diagnosis not present

## 2017-06-13 DIAGNOSIS — G8929 Other chronic pain: Secondary | ICD-10-CM | POA: Diagnosis not present

## 2017-06-14 DIAGNOSIS — F419 Anxiety disorder, unspecified: Secondary | ICD-10-CM | POA: Diagnosis not present

## 2017-06-14 DIAGNOSIS — F17210 Nicotine dependence, cigarettes, uncomplicated: Secondary | ICD-10-CM | POA: Diagnosis not present

## 2017-06-14 DIAGNOSIS — F331 Major depressive disorder, recurrent, moderate: Secondary | ICD-10-CM | POA: Diagnosis not present

## 2017-06-14 DIAGNOSIS — F5101 Primary insomnia: Secondary | ICD-10-CM | POA: Diagnosis not present

## 2017-06-15 DIAGNOSIS — G8929 Other chronic pain: Secondary | ICD-10-CM | POA: Diagnosis not present

## 2017-06-15 DIAGNOSIS — A419 Sepsis, unspecified organism: Secondary | ICD-10-CM | POA: Diagnosis not present

## 2017-06-15 DIAGNOSIS — Z7409 Other reduced mobility: Secondary | ICD-10-CM | POA: Diagnosis not present

## 2017-06-15 DIAGNOSIS — R2689 Other abnormalities of gait and mobility: Secondary | ICD-10-CM | POA: Diagnosis not present

## 2017-06-17 DIAGNOSIS — I509 Heart failure, unspecified: Secondary | ICD-10-CM | POA: Diagnosis not present

## 2017-06-17 DIAGNOSIS — R262 Difficulty in walking, not elsewhere classified: Secondary | ICD-10-CM | POA: Diagnosis not present

## 2017-06-17 DIAGNOSIS — J441 Chronic obstructive pulmonary disease with (acute) exacerbation: Secondary | ICD-10-CM | POA: Diagnosis not present

## 2017-06-17 DIAGNOSIS — M6281 Muscle weakness (generalized): Secondary | ICD-10-CM | POA: Diagnosis not present

## 2017-06-19 DIAGNOSIS — J441 Chronic obstructive pulmonary disease with (acute) exacerbation: Secondary | ICD-10-CM | POA: Diagnosis not present

## 2017-06-19 DIAGNOSIS — M6281 Muscle weakness (generalized): Secondary | ICD-10-CM | POA: Diagnosis not present

## 2017-06-19 DIAGNOSIS — R262 Difficulty in walking, not elsewhere classified: Secondary | ICD-10-CM | POA: Diagnosis not present

## 2017-06-19 DIAGNOSIS — I509 Heart failure, unspecified: Secondary | ICD-10-CM | POA: Diagnosis not present

## 2017-06-21 DIAGNOSIS — M6281 Muscle weakness (generalized): Secondary | ICD-10-CM | POA: Diagnosis not present

## 2017-06-21 DIAGNOSIS — J441 Chronic obstructive pulmonary disease with (acute) exacerbation: Secondary | ICD-10-CM | POA: Diagnosis not present

## 2017-06-21 DIAGNOSIS — I509 Heart failure, unspecified: Secondary | ICD-10-CM | POA: Diagnosis not present

## 2017-06-21 DIAGNOSIS — R262 Difficulty in walking, not elsewhere classified: Secondary | ICD-10-CM | POA: Diagnosis not present

## 2017-06-25 DIAGNOSIS — R262 Difficulty in walking, not elsewhere classified: Secondary | ICD-10-CM | POA: Diagnosis not present

## 2017-06-25 DIAGNOSIS — J441 Chronic obstructive pulmonary disease with (acute) exacerbation: Secondary | ICD-10-CM | POA: Diagnosis not present

## 2017-06-25 DIAGNOSIS — Z79899 Other long term (current) drug therapy: Secondary | ICD-10-CM | POA: Diagnosis not present

## 2017-06-25 DIAGNOSIS — M6281 Muscle weakness (generalized): Secondary | ICD-10-CM | POA: Diagnosis not present

## 2017-06-25 DIAGNOSIS — I509 Heart failure, unspecified: Secondary | ICD-10-CM | POA: Diagnosis not present

## 2017-06-27 DIAGNOSIS — I509 Heart failure, unspecified: Secondary | ICD-10-CM | POA: Diagnosis not present

## 2017-06-27 DIAGNOSIS — J441 Chronic obstructive pulmonary disease with (acute) exacerbation: Secondary | ICD-10-CM | POA: Diagnosis not present

## 2017-06-27 DIAGNOSIS — M6281 Muscle weakness (generalized): Secondary | ICD-10-CM | POA: Diagnosis not present

## 2017-06-27 DIAGNOSIS — R262 Difficulty in walking, not elsewhere classified: Secondary | ICD-10-CM | POA: Diagnosis not present

## 2017-06-28 DIAGNOSIS — R1010 Upper abdominal pain, unspecified: Secondary | ICD-10-CM | POA: Diagnosis not present

## 2017-06-29 DIAGNOSIS — J441 Chronic obstructive pulmonary disease with (acute) exacerbation: Secondary | ICD-10-CM | POA: Diagnosis not present

## 2017-06-29 DIAGNOSIS — M6281 Muscle weakness (generalized): Secondary | ICD-10-CM | POA: Diagnosis not present

## 2017-06-29 DIAGNOSIS — R262 Difficulty in walking, not elsewhere classified: Secondary | ICD-10-CM | POA: Diagnosis not present

## 2017-06-29 DIAGNOSIS — I509 Heart failure, unspecified: Secondary | ICD-10-CM | POA: Diagnosis not present

## 2017-07-01 DIAGNOSIS — I509 Heart failure, unspecified: Secondary | ICD-10-CM | POA: Diagnosis not present

## 2017-07-01 DIAGNOSIS — M6281 Muscle weakness (generalized): Secondary | ICD-10-CM | POA: Diagnosis not present

## 2017-07-01 DIAGNOSIS — J441 Chronic obstructive pulmonary disease with (acute) exacerbation: Secondary | ICD-10-CM | POA: Diagnosis not present

## 2017-07-01 DIAGNOSIS — R262 Difficulty in walking, not elsewhere classified: Secondary | ICD-10-CM | POA: Diagnosis not present

## 2017-07-02 DIAGNOSIS — M6281 Muscle weakness (generalized): Secondary | ICD-10-CM | POA: Diagnosis not present

## 2017-07-02 DIAGNOSIS — I509 Heart failure, unspecified: Secondary | ICD-10-CM | POA: Diagnosis not present

## 2017-07-02 DIAGNOSIS — J441 Chronic obstructive pulmonary disease with (acute) exacerbation: Secondary | ICD-10-CM | POA: Diagnosis not present

## 2017-07-02 DIAGNOSIS — R262 Difficulty in walking, not elsewhere classified: Secondary | ICD-10-CM | POA: Diagnosis not present

## 2017-07-03 DIAGNOSIS — R262 Difficulty in walking, not elsewhere classified: Secondary | ICD-10-CM | POA: Diagnosis not present

## 2017-07-03 DIAGNOSIS — I509 Heart failure, unspecified: Secondary | ICD-10-CM | POA: Diagnosis not present

## 2017-07-03 DIAGNOSIS — J441 Chronic obstructive pulmonary disease with (acute) exacerbation: Secondary | ICD-10-CM | POA: Diagnosis not present

## 2017-07-03 DIAGNOSIS — M6281 Muscle weakness (generalized): Secondary | ICD-10-CM | POA: Diagnosis not present

## 2017-07-04 DIAGNOSIS — M6281 Muscle weakness (generalized): Secondary | ICD-10-CM | POA: Diagnosis not present

## 2017-07-04 DIAGNOSIS — R262 Difficulty in walking, not elsewhere classified: Secondary | ICD-10-CM | POA: Diagnosis not present

## 2017-07-04 DIAGNOSIS — I509 Heart failure, unspecified: Secondary | ICD-10-CM | POA: Diagnosis not present

## 2017-07-04 DIAGNOSIS — J441 Chronic obstructive pulmonary disease with (acute) exacerbation: Secondary | ICD-10-CM | POA: Diagnosis not present

## 2017-07-05 DIAGNOSIS — K8020 Calculus of gallbladder without cholecystitis without obstruction: Secondary | ICD-10-CM | POA: Diagnosis not present

## 2017-07-08 DIAGNOSIS — J441 Chronic obstructive pulmonary disease with (acute) exacerbation: Secondary | ICD-10-CM | POA: Diagnosis not present

## 2017-07-08 DIAGNOSIS — M6281 Muscle weakness (generalized): Secondary | ICD-10-CM | POA: Diagnosis not present

## 2017-07-08 DIAGNOSIS — R262 Difficulty in walking, not elsewhere classified: Secondary | ICD-10-CM | POA: Diagnosis not present

## 2017-07-08 DIAGNOSIS — I509 Heart failure, unspecified: Secondary | ICD-10-CM | POA: Diagnosis not present

## 2017-07-09 DIAGNOSIS — R262 Difficulty in walking, not elsewhere classified: Secondary | ICD-10-CM | POA: Diagnosis not present

## 2017-07-09 DIAGNOSIS — I509 Heart failure, unspecified: Secondary | ICD-10-CM | POA: Diagnosis not present

## 2017-07-09 DIAGNOSIS — J441 Chronic obstructive pulmonary disease with (acute) exacerbation: Secondary | ICD-10-CM | POA: Diagnosis not present

## 2017-07-09 DIAGNOSIS — M6281 Muscle weakness (generalized): Secondary | ICD-10-CM | POA: Diagnosis not present

## 2017-07-11 DIAGNOSIS — R262 Difficulty in walking, not elsewhere classified: Secondary | ICD-10-CM | POA: Diagnosis not present

## 2017-07-11 DIAGNOSIS — M6281 Muscle weakness (generalized): Secondary | ICD-10-CM | POA: Diagnosis not present

## 2017-07-11 DIAGNOSIS — I509 Heart failure, unspecified: Secondary | ICD-10-CM | POA: Diagnosis not present

## 2017-07-11 DIAGNOSIS — J441 Chronic obstructive pulmonary disease with (acute) exacerbation: Secondary | ICD-10-CM | POA: Diagnosis not present

## 2017-07-15 DIAGNOSIS — M6281 Muscle weakness (generalized): Secondary | ICD-10-CM | POA: Diagnosis not present

## 2017-07-15 DIAGNOSIS — F331 Major depressive disorder, recurrent, moderate: Secondary | ICD-10-CM | POA: Diagnosis not present

## 2017-07-15 DIAGNOSIS — D649 Anemia, unspecified: Secondary | ICD-10-CM | POA: Diagnosis not present

## 2017-07-15 DIAGNOSIS — F419 Anxiety disorder, unspecified: Secondary | ICD-10-CM | POA: Diagnosis not present

## 2017-07-15 DIAGNOSIS — K219 Gastro-esophageal reflux disease without esophagitis: Secondary | ICD-10-CM | POA: Diagnosis not present

## 2017-07-15 DIAGNOSIS — R262 Difficulty in walking, not elsewhere classified: Secondary | ICD-10-CM | POA: Diagnosis not present

## 2017-07-15 DIAGNOSIS — J4521 Mild intermittent asthma with (acute) exacerbation: Secondary | ICD-10-CM | POA: Diagnosis not present

## 2017-07-15 DIAGNOSIS — R0602 Shortness of breath: Secondary | ICD-10-CM | POA: Diagnosis not present

## 2017-07-15 DIAGNOSIS — J441 Chronic obstructive pulmonary disease with (acute) exacerbation: Secondary | ICD-10-CM | POA: Diagnosis not present

## 2017-07-15 DIAGNOSIS — M62838 Other muscle spasm: Secondary | ICD-10-CM | POA: Diagnosis not present

## 2017-07-15 DIAGNOSIS — I509 Heart failure, unspecified: Secondary | ICD-10-CM | POA: Diagnosis not present

## 2017-07-15 DIAGNOSIS — G8929 Other chronic pain: Secondary | ICD-10-CM | POA: Diagnosis not present

## 2017-07-15 DIAGNOSIS — F17210 Nicotine dependence, cigarettes, uncomplicated: Secondary | ICD-10-CM | POA: Diagnosis not present

## 2017-07-15 DIAGNOSIS — F321 Major depressive disorder, single episode, moderate: Secondary | ICD-10-CM | POA: Diagnosis not present

## 2017-07-15 DIAGNOSIS — F5101 Primary insomnia: Secondary | ICD-10-CM | POA: Diagnosis not present

## 2017-07-16 DIAGNOSIS — J441 Chronic obstructive pulmonary disease with (acute) exacerbation: Secondary | ICD-10-CM | POA: Diagnosis not present

## 2017-07-16 DIAGNOSIS — I509 Heart failure, unspecified: Secondary | ICD-10-CM | POA: Diagnosis not present

## 2017-07-16 DIAGNOSIS — M6281 Muscle weakness (generalized): Secondary | ICD-10-CM | POA: Diagnosis not present

## 2017-07-16 DIAGNOSIS — R262 Difficulty in walking, not elsewhere classified: Secondary | ICD-10-CM | POA: Diagnosis not present

## 2017-07-17 DIAGNOSIS — R262 Difficulty in walking, not elsewhere classified: Secondary | ICD-10-CM | POA: Diagnosis not present

## 2017-07-17 DIAGNOSIS — M6281 Muscle weakness (generalized): Secondary | ICD-10-CM | POA: Diagnosis not present

## 2017-07-17 DIAGNOSIS — J441 Chronic obstructive pulmonary disease with (acute) exacerbation: Secondary | ICD-10-CM | POA: Diagnosis not present

## 2017-07-17 DIAGNOSIS — I509 Heart failure, unspecified: Secondary | ICD-10-CM | POA: Diagnosis not present

## 2017-07-19 DIAGNOSIS — R351 Nocturia: Secondary | ICD-10-CM | POA: Diagnosis not present

## 2017-07-19 DIAGNOSIS — N3281 Overactive bladder: Secondary | ICD-10-CM | POA: Diagnosis not present

## 2017-07-19 DIAGNOSIS — N3946 Mixed incontinence: Secondary | ICD-10-CM | POA: Diagnosis not present

## 2017-07-22 DIAGNOSIS — J441 Chronic obstructive pulmonary disease with (acute) exacerbation: Secondary | ICD-10-CM | POA: Diagnosis not present

## 2017-07-22 DIAGNOSIS — M6281 Muscle weakness (generalized): Secondary | ICD-10-CM | POA: Diagnosis not present

## 2017-07-22 DIAGNOSIS — I509 Heart failure, unspecified: Secondary | ICD-10-CM | POA: Diagnosis not present

## 2017-07-22 DIAGNOSIS — R262 Difficulty in walking, not elsewhere classified: Secondary | ICD-10-CM | POA: Diagnosis not present

## 2017-07-24 DIAGNOSIS — J441 Chronic obstructive pulmonary disease with (acute) exacerbation: Secondary | ICD-10-CM | POA: Diagnosis not present

## 2017-07-24 DIAGNOSIS — R262 Difficulty in walking, not elsewhere classified: Secondary | ICD-10-CM | POA: Diagnosis not present

## 2017-07-24 DIAGNOSIS — M6281 Muscle weakness (generalized): Secondary | ICD-10-CM | POA: Diagnosis not present

## 2017-07-24 DIAGNOSIS — I509 Heart failure, unspecified: Secondary | ICD-10-CM | POA: Diagnosis not present

## 2017-07-25 DIAGNOSIS — F5101 Primary insomnia: Secondary | ICD-10-CM | POA: Diagnosis not present

## 2017-07-25 DIAGNOSIS — F419 Anxiety disorder, unspecified: Secondary | ICD-10-CM | POA: Diagnosis not present

## 2017-07-25 DIAGNOSIS — F17210 Nicotine dependence, cigarettes, uncomplicated: Secondary | ICD-10-CM | POA: Diagnosis not present

## 2017-07-25 DIAGNOSIS — F331 Major depressive disorder, recurrent, moderate: Secondary | ICD-10-CM | POA: Diagnosis not present

## 2017-07-26 DIAGNOSIS — R262 Difficulty in walking, not elsewhere classified: Secondary | ICD-10-CM | POA: Diagnosis not present

## 2017-07-26 DIAGNOSIS — M6281 Muscle weakness (generalized): Secondary | ICD-10-CM | POA: Diagnosis not present

## 2017-07-26 DIAGNOSIS — J441 Chronic obstructive pulmonary disease with (acute) exacerbation: Secondary | ICD-10-CM | POA: Diagnosis not present

## 2017-07-26 DIAGNOSIS — I509 Heart failure, unspecified: Secondary | ICD-10-CM | POA: Diagnosis not present

## 2017-07-31 DIAGNOSIS — R262 Difficulty in walking, not elsewhere classified: Secondary | ICD-10-CM | POA: Diagnosis not present

## 2017-07-31 DIAGNOSIS — I509 Heart failure, unspecified: Secondary | ICD-10-CM | POA: Diagnosis not present

## 2017-07-31 DIAGNOSIS — M6281 Muscle weakness (generalized): Secondary | ICD-10-CM | POA: Diagnosis not present

## 2017-07-31 DIAGNOSIS — J441 Chronic obstructive pulmonary disease with (acute) exacerbation: Secondary | ICD-10-CM | POA: Diagnosis not present

## 2017-08-02 DIAGNOSIS — M6281 Muscle weakness (generalized): Secondary | ICD-10-CM | POA: Diagnosis not present

## 2017-08-02 DIAGNOSIS — J441 Chronic obstructive pulmonary disease with (acute) exacerbation: Secondary | ICD-10-CM | POA: Diagnosis not present

## 2017-08-02 DIAGNOSIS — R262 Difficulty in walking, not elsewhere classified: Secondary | ICD-10-CM | POA: Diagnosis not present

## 2017-08-02 DIAGNOSIS — I509 Heart failure, unspecified: Secondary | ICD-10-CM | POA: Diagnosis not present

## 2017-08-03 DIAGNOSIS — R262 Difficulty in walking, not elsewhere classified: Secondary | ICD-10-CM | POA: Diagnosis not present

## 2017-08-03 DIAGNOSIS — J441 Chronic obstructive pulmonary disease with (acute) exacerbation: Secondary | ICD-10-CM | POA: Diagnosis not present

## 2017-08-03 DIAGNOSIS — M6281 Muscle weakness (generalized): Secondary | ICD-10-CM | POA: Diagnosis not present

## 2017-08-03 DIAGNOSIS — I509 Heart failure, unspecified: Secondary | ICD-10-CM | POA: Diagnosis not present

## 2017-08-05 DIAGNOSIS — J441 Chronic obstructive pulmonary disease with (acute) exacerbation: Secondary | ICD-10-CM | POA: Diagnosis not present

## 2017-08-05 DIAGNOSIS — R262 Difficulty in walking, not elsewhere classified: Secondary | ICD-10-CM | POA: Diagnosis not present

## 2017-08-05 DIAGNOSIS — M6281 Muscle weakness (generalized): Secondary | ICD-10-CM | POA: Diagnosis not present

## 2017-08-05 DIAGNOSIS — I509 Heart failure, unspecified: Secondary | ICD-10-CM | POA: Diagnosis not present

## 2017-08-06 DIAGNOSIS — M6281 Muscle weakness (generalized): Secondary | ICD-10-CM | POA: Diagnosis not present

## 2017-08-06 DIAGNOSIS — R262 Difficulty in walking, not elsewhere classified: Secondary | ICD-10-CM | POA: Diagnosis not present

## 2017-08-06 DIAGNOSIS — I509 Heart failure, unspecified: Secondary | ICD-10-CM | POA: Diagnosis not present

## 2017-08-06 DIAGNOSIS — J441 Chronic obstructive pulmonary disease with (acute) exacerbation: Secondary | ICD-10-CM | POA: Diagnosis not present

## 2017-08-07 DIAGNOSIS — M6281 Muscle weakness (generalized): Secondary | ICD-10-CM | POA: Diagnosis not present

## 2017-08-07 DIAGNOSIS — R600 Localized edema: Secondary | ICD-10-CM | POA: Diagnosis not present

## 2017-08-07 DIAGNOSIS — J441 Chronic obstructive pulmonary disease with (acute) exacerbation: Secondary | ICD-10-CM | POA: Diagnosis not present

## 2017-08-07 DIAGNOSIS — R262 Difficulty in walking, not elsewhere classified: Secondary | ICD-10-CM | POA: Diagnosis not present

## 2017-08-07 DIAGNOSIS — I739 Peripheral vascular disease, unspecified: Secondary | ICD-10-CM | POA: Diagnosis not present

## 2017-08-07 DIAGNOSIS — M2010 Hallux valgus (acquired), unspecified foot: Secondary | ICD-10-CM | POA: Diagnosis not present

## 2017-08-07 DIAGNOSIS — I509 Heart failure, unspecified: Secondary | ICD-10-CM | POA: Diagnosis not present

## 2017-08-07 DIAGNOSIS — L853 Xerosis cutis: Secondary | ICD-10-CM | POA: Diagnosis not present

## 2017-08-07 DIAGNOSIS — Q845 Enlarged and hypertrophic nails: Secondary | ICD-10-CM | POA: Diagnosis not present

## 2017-08-07 DIAGNOSIS — B351 Tinea unguium: Secondary | ICD-10-CM | POA: Diagnosis not present

## 2017-08-07 DIAGNOSIS — L603 Nail dystrophy: Secondary | ICD-10-CM | POA: Diagnosis not present

## 2017-08-12 DIAGNOSIS — J441 Chronic obstructive pulmonary disease with (acute) exacerbation: Secondary | ICD-10-CM | POA: Diagnosis not present

## 2017-08-12 DIAGNOSIS — G8929 Other chronic pain: Secondary | ICD-10-CM | POA: Diagnosis not present

## 2017-08-12 DIAGNOSIS — F321 Major depressive disorder, single episode, moderate: Secondary | ICD-10-CM | POA: Diagnosis not present

## 2017-08-12 DIAGNOSIS — K219 Gastro-esophageal reflux disease without esophagitis: Secondary | ICD-10-CM | POA: Diagnosis not present

## 2017-08-12 DIAGNOSIS — F17210 Nicotine dependence, cigarettes, uncomplicated: Secondary | ICD-10-CM | POA: Diagnosis not present

## 2017-08-12 DIAGNOSIS — F5101 Primary insomnia: Secondary | ICD-10-CM | POA: Diagnosis not present

## 2017-08-12 DIAGNOSIS — D649 Anemia, unspecified: Secondary | ICD-10-CM | POA: Diagnosis not present

## 2017-08-12 DIAGNOSIS — J4521 Mild intermittent asthma with (acute) exacerbation: Secondary | ICD-10-CM | POA: Diagnosis not present

## 2017-08-12 DIAGNOSIS — F419 Anxiety disorder, unspecified: Secondary | ICD-10-CM | POA: Diagnosis not present

## 2017-08-12 DIAGNOSIS — M62838 Other muscle spasm: Secondary | ICD-10-CM | POA: Diagnosis not present

## 2017-08-12 DIAGNOSIS — F331 Major depressive disorder, recurrent, moderate: Secondary | ICD-10-CM | POA: Diagnosis not present

## 2017-08-12 DIAGNOSIS — R0602 Shortness of breath: Secondary | ICD-10-CM | POA: Diagnosis not present

## 2017-08-28 DIAGNOSIS — N390 Urinary tract infection, site not specified: Secondary | ICD-10-CM | POA: Diagnosis not present

## 2017-08-29 DIAGNOSIS — Z79899 Other long term (current) drug therapy: Secondary | ICD-10-CM | POA: Diagnosis not present

## 2017-08-30 ENCOUNTER — Encounter: Payer: Self-pay | Admitting: Internal Medicine

## 2017-09-03 DIAGNOSIS — N390 Urinary tract infection, site not specified: Secondary | ICD-10-CM | POA: Diagnosis not present

## 2017-09-19 DIAGNOSIS — N3281 Overactive bladder: Secondary | ICD-10-CM | POA: Diagnosis not present

## 2017-09-19 DIAGNOSIS — R110 Nausea: Secondary | ICD-10-CM | POA: Diagnosis not present

## 2017-09-19 DIAGNOSIS — J449 Chronic obstructive pulmonary disease, unspecified: Secondary | ICD-10-CM | POA: Diagnosis not present

## 2017-09-19 DIAGNOSIS — F17210 Nicotine dependence, cigarettes, uncomplicated: Secondary | ICD-10-CM | POA: Diagnosis not present

## 2017-09-19 DIAGNOSIS — I509 Heart failure, unspecified: Secondary | ICD-10-CM | POA: Diagnosis not present

## 2017-09-19 DIAGNOSIS — I739 Peripheral vascular disease, unspecified: Secondary | ICD-10-CM | POA: Diagnosis not present

## 2017-09-19 DIAGNOSIS — G894 Chronic pain syndrome: Secondary | ICD-10-CM | POA: Diagnosis not present

## 2017-09-19 DIAGNOSIS — K219 Gastro-esophageal reflux disease without esophagitis: Secondary | ICD-10-CM | POA: Diagnosis not present

## 2017-09-19 DIAGNOSIS — R197 Diarrhea, unspecified: Secondary | ICD-10-CM | POA: Diagnosis not present

## 2017-09-19 DIAGNOSIS — R54 Age-related physical debility: Secondary | ICD-10-CM | POA: Diagnosis not present

## 2017-09-19 DIAGNOSIS — L853 Xerosis cutis: Secondary | ICD-10-CM | POA: Diagnosis not present

## 2017-09-19 DIAGNOSIS — J309 Allergic rhinitis, unspecified: Secondary | ICD-10-CM | POA: Diagnosis not present

## 2017-09-20 DIAGNOSIS — E039 Hypothyroidism, unspecified: Secondary | ICD-10-CM | POA: Diagnosis not present

## 2017-09-20 DIAGNOSIS — J069 Acute upper respiratory infection, unspecified: Secondary | ICD-10-CM | POA: Diagnosis not present

## 2017-09-20 DIAGNOSIS — R093 Abnormal sputum: Secondary | ICD-10-CM | POA: Diagnosis not present

## 2017-09-20 DIAGNOSIS — E559 Vitamin D deficiency, unspecified: Secondary | ICD-10-CM | POA: Diagnosis not present

## 2017-09-23 DIAGNOSIS — Z1231 Encounter for screening mammogram for malignant neoplasm of breast: Secondary | ICD-10-CM | POA: Diagnosis not present

## 2017-09-24 DIAGNOSIS — R05 Cough: Secondary | ICD-10-CM | POA: Diagnosis not present

## 2017-09-24 DIAGNOSIS — J449 Chronic obstructive pulmonary disease, unspecified: Secondary | ICD-10-CM | POA: Diagnosis not present

## 2017-09-24 DIAGNOSIS — J069 Acute upper respiratory infection, unspecified: Secondary | ICD-10-CM | POA: Diagnosis not present

## 2017-09-26 DIAGNOSIS — E039 Hypothyroidism, unspecified: Secondary | ICD-10-CM | POA: Diagnosis not present

## 2017-09-26 DIAGNOSIS — E559 Vitamin D deficiency, unspecified: Secondary | ICD-10-CM | POA: Diagnosis not present

## 2017-10-01 DIAGNOSIS — Z79899 Other long term (current) drug therapy: Secondary | ICD-10-CM | POA: Diagnosis not present

## 2017-10-01 DIAGNOSIS — F419 Anxiety disorder, unspecified: Secondary | ICD-10-CM | POA: Diagnosis not present

## 2017-10-01 DIAGNOSIS — F17210 Nicotine dependence, cigarettes, uncomplicated: Secondary | ICD-10-CM | POA: Diagnosis not present

## 2017-10-01 DIAGNOSIS — F5101 Primary insomnia: Secondary | ICD-10-CM | POA: Diagnosis not present

## 2017-10-01 DIAGNOSIS — F331 Major depressive disorder, recurrent, moderate: Secondary | ICD-10-CM | POA: Diagnosis not present

## 2017-10-07 DIAGNOSIS — Z79899 Other long term (current) drug therapy: Secondary | ICD-10-CM | POA: Diagnosis not present

## 2017-10-07 DIAGNOSIS — D649 Anemia, unspecified: Secondary | ICD-10-CM | POA: Diagnosis not present

## 2017-10-08 DIAGNOSIS — H524 Presbyopia: Secondary | ICD-10-CM | POA: Diagnosis not present

## 2017-10-08 DIAGNOSIS — Z7951 Long term (current) use of inhaled steroids: Secondary | ICD-10-CM | POA: Diagnosis not present

## 2017-10-08 DIAGNOSIS — H2513 Age-related nuclear cataract, bilateral: Secondary | ICD-10-CM | POA: Diagnosis not present

## 2017-10-14 DIAGNOSIS — D649 Anemia, unspecified: Secondary | ICD-10-CM | POA: Diagnosis not present

## 2017-10-14 DIAGNOSIS — J441 Chronic obstructive pulmonary disease with (acute) exacerbation: Secondary | ICD-10-CM | POA: Diagnosis not present

## 2017-10-14 DIAGNOSIS — F17210 Nicotine dependence, cigarettes, uncomplicated: Secondary | ICD-10-CM | POA: Diagnosis not present

## 2017-10-14 DIAGNOSIS — F321 Major depressive disorder, single episode, moderate: Secondary | ICD-10-CM | POA: Diagnosis not present

## 2017-10-14 DIAGNOSIS — K219 Gastro-esophageal reflux disease without esophagitis: Secondary | ICD-10-CM | POA: Diagnosis not present

## 2017-10-14 DIAGNOSIS — F331 Major depressive disorder, recurrent, moderate: Secondary | ICD-10-CM | POA: Diagnosis not present

## 2017-10-14 DIAGNOSIS — R0602 Shortness of breath: Secondary | ICD-10-CM | POA: Diagnosis not present

## 2017-10-14 DIAGNOSIS — F5101 Primary insomnia: Secondary | ICD-10-CM | POA: Diagnosis not present

## 2017-10-14 DIAGNOSIS — F419 Anxiety disorder, unspecified: Secondary | ICD-10-CM | POA: Diagnosis not present

## 2017-10-14 DIAGNOSIS — M62838 Other muscle spasm: Secondary | ICD-10-CM | POA: Diagnosis not present

## 2017-10-14 DIAGNOSIS — J4521 Mild intermittent asthma with (acute) exacerbation: Secondary | ICD-10-CM | POA: Diagnosis not present

## 2017-10-14 DIAGNOSIS — G8929 Other chronic pain: Secondary | ICD-10-CM | POA: Diagnosis not present

## 2017-10-28 ENCOUNTER — Encounter: Payer: Self-pay | Admitting: *Deleted

## 2017-10-28 ENCOUNTER — Telehealth: Payer: Self-pay | Admitting: *Deleted

## 2017-10-28 ENCOUNTER — Other Ambulatory Visit: Payer: Self-pay | Admitting: *Deleted

## 2017-10-28 ENCOUNTER — Ambulatory Visit (INDEPENDENT_AMBULATORY_CARE_PROVIDER_SITE_OTHER): Payer: Medicare Other | Admitting: Gastroenterology

## 2017-10-28 ENCOUNTER — Telehealth: Payer: Self-pay

## 2017-10-28 ENCOUNTER — Encounter: Payer: Self-pay | Admitting: Gastroenterology

## 2017-10-28 DIAGNOSIS — R110 Nausea: Secondary | ICD-10-CM

## 2017-10-28 DIAGNOSIS — K219 Gastro-esophageal reflux disease without esophagitis: Secondary | ICD-10-CM | POA: Diagnosis not present

## 2017-10-28 DIAGNOSIS — R1013 Epigastric pain: Secondary | ICD-10-CM

## 2017-10-28 DIAGNOSIS — Z1211 Encounter for screening for malignant neoplasm of colon: Secondary | ICD-10-CM | POA: Insufficient documentation

## 2017-10-28 NOTE — Patient Instructions (Signed)
1. Upper endoscopy as scheduled. See separate instructions.  2. Take PANTOPRAZOLE 40 mg daily 30 minutes before breakfast INSTEAD OF AT BEDTIME.   3. Cologuard test to screen for colon cancer.  4. I will discuss your concerns for passing capsule beads with the pharmacist. Further recommendations to follow.

## 2017-10-28 NOTE — Telephone Encounter (Signed)
Pre-op scheduled for 11/15/17 Friday at 11:00am. Letter mailed to pt. Pt aware.

## 2017-10-28 NOTE — H&P (View-Only) (Signed)
Please send copy of OV note to [facility] in [place].   Alicia, please contact patient's nurse and let them know   "Seeing unabsorbed medications in your stool does not necessarily mean that there is anything wrong with your colon or digestion. Pharmaceutical companies make many different types of medication coatings from extended release coatings to cellulose capsules. In most cases, the body is absorbing the medications as it should and expelling the shell of the medication.  Long-acting or extended-release capsules have a special outer coating designed to be absorbed slowly. While the medication may be released, the shell may remain intact and be refilled with liquids from your intestinal tract.  It's similar to the digestion of corn kernels-your body digests the inner grain of corn, but the tough, fibrous husk is expelled. The liquids and waste from your body refill the corn husk-and likewise some medication "husks"-making them appear full and unabsorbed.  There is a simple way to determine if this is the case. If the medications are working or doing what they are prescribed to do, then you are most likely just seeing the empty capsule in your stool. For instance, if you are taking medications for diabetes and your blood sugar remains within a normal range, there is a good chance the medications are getting absorbed properly."  This is excerpt found when looking into her concerns about seeing medication beads in her stools. She is on only 3 capsules that may have beads (Embeda, Cymbalta, KCL).  

## 2017-10-28 NOTE — Assessment & Plan Note (Signed)
Cologuard testing in near future. Patient not interested in colonoscopy as a screening maneuver but would be willing if she had positive Cologuard testing.

## 2017-10-28 NOTE — Progress Notes (Signed)
Primary Care Physician:  Audree Bane, DO  Primary Gastroenterologist:  Roetta Sessions, MD   Chief Complaint  Patient presents with  . Abdominal Pain    occas. Reports she finds her meds when she has a BM  . Nausea    HPI:  Gloria Chapman is a 62 y.o. female here at the request of Dr. Audree Bane for further evaluation of abdominal pain and incomplete digestion of her medication.   Patient complains of abdominal pain and nausea without vomiting, occurring for more than a year. Remote EGD, "normal". She has uncontrolled GERD with heartburn and reflux, her pantoprazole is giving at bedtime. Intermittent dysphagia to solid foods but doesn't happen all the time. Vague history. She reports BM about every other day. Occasional constipation. States she sees beads from inside her capsules in her stool. Notes it when she wipes. Tiny white beads. No melena, brbpr. No diarrhea.   Remote colonoscopy. Patient states she would only have one again if she was having a problem. She reports if Cologuard was positive, she would be willing to have a colonoscopy.   Mobility is limited. Able to transfer to chair and bed. 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     Current Outpatient Medications  Medication Sig Dispense Refill  . aspirin 325 MG tablet Take 325 mg by mouth daily.    . AZO-CRANBERRY PO Take 2 tablets by mouth every 6 (six) hours as needed.    . Calcium Carbonate-Vitamin D3 (CALCIUM 600-D) 600-400 MG-UNIT TABS Take 1 tablet by mouth daily.    Marland Kitchen dextromethorphan (DELSYM) 30 MG/5ML liquid Take by mouth as needed for cough.    . DULoxetine (CYMBALTA) 60 MG capsule Take 60 mg by mouth daily.    . fluticasone (FLONASE) 50 MCG/ACT nasal spray Place 1 spray into both nostrils 2 (two) times daily.    . fluticasone furoate-vilanterol (BREO ELLIPTA) 100-25 MCG/INH AEPB Inhale 1 puff into the lungs daily.    .  furosemide (LASIX) 40 MG tablet Take 40 mg by mouth daily.    Marland Kitchen GABAPENTIN PO Take 500 mg by mouth every 8 (eight) hours as needed.    Marland Kitchen HYDROmorphone (DILAUDID) 4 MG tablet Take 4 mg by mouth every 4 (four) hours as needed for severe pain.    . hydrOXYzine (ATARAX/VISTARIL) 25 MG tablet Take 25 mg by mouth 4 (four) times daily.    Marland Kitchen levocetirizine (XYZAL) 5 MG tablet Take 5 mg by mouth every evening.    Marland Kitchen LORazepam (ATIVAN) 0.5 MG tablet Take 0.5 mg by mouth daily.    Marland Kitchen LORazepam (ATIVAN) 1 MG tablet Take 1 mg by mouth 2 (two) times daily.    . Melatonin 1 MG TABS Take 6 mg by mouth at bedtime.    . montelukast (SINGULAIR) 10 MG tablet Take 10 mg by mouth at bedtime.    . Morphine-Naltrexone (EMBEDA) 60-2.4 MG CPCR Take 1 tablet by mouth 2 (two) times daily.    . ondansetron (ZOFRAN) 4 MG tablet Take 4 mg by mouth every 12 (twelve) hours as needed for nausea or vomiting.    Marland Kitchen oxybutynin (DITROPAN-XL) 5 MG 24 hr tablet Take 5 mg by mouth at bedtime.    . pantoprazole (PROTONIX) 40 MG tablet Take 40 mg by mouth at bedtime.    . potassium chloride (K-DUR,KLOR-CON) 10 MEQ tablet Take 10 mEq by mouth daily.    Marland Kitchen  tiZANidine (ZANAFLEX) 2 MG tablet Take 2 mg by mouth every 8 (eight) hours as needed for muscle spasms.    . traZODone (DESYREL) 100 MG tablet Take 100 mg by mouth at bedtime.     No current facility-administered medications for this visit.     Allergies as of 10/28/2017  . (No Known Allergies)    Past Medical History:  Diagnosis Date  . Anxiety   . COPD (chronic obstructive pulmonary disease) (HCC)   . Depression   . GERD (gastroesophageal reflux disease)   . Heart failure (HCC)   . History of Clostridium difficile infection   . History of methicillin resistant staphylococcus aureus (MRSA)   . Insomnia   . Overactive bladder     Past Surgical History:  Procedure Laterality Date  . APPENDECTOMY    . BACK SURGERY     twice  . DIAGNOSTIC LAPAROSCOPY     checking for  endometriosis  . NASAL SINUS SURGERY    . right hip surgery     [place]: "took my hip out due to MRSA" occurred after hip replacements    Family History  Problem Relation Age of Onset  . Cancer Father        unknown  . Colon cancer Neg Hx     Social History   Socioeconomic History  . Marital status: Divorced    Spouse name: Not on file  . Number of children: Not on file  . Years of education: Not on file  . Highest education level: Not on file  Social Needs  . Financial resource strain: Not on file  . Food insecurity - worry: Not on file  . Food insecurity - inability: Not on file  . Transportation needs - medical: Not on file  . Transportation needs - non-medical: Not on file  Occupational History  . Not on file  Tobacco Use  . Smoking status: Current Every Day Smoker    Packs/day: 0.50    Types: Cigarettes  . Smokeless tobacco: Never Used  Substance and Sexual Activity  . Alcohol use: No    Frequency: Never  . Drug use: No  . Sexual activity: Not on file  Other Topics Concern  . Not on file  Social History Narrative  . Not on file      ROS:  General: Negative for anorexia, weight loss, fever, chills, fatigue, weakness. Eyes: Negative for vision changes.  ENT: Negative for hoarseness, difficulty swallowing , nasal congestion. CV: Negative for chest pain, angina, palpitations, dyspnea on exertion, peripheral edema.  Respiratory: Negative for dyspnea at rest, dyspnea on exertion, cough, sputum, wheezing.  GI: See history of present illness. GU:  Negative for dysuria, hematuria, urinary incontinence, urinary frequency, nocturnal urination.  MS: Negative for joint pain, low back pain.  Derm: Negative for rash or itching.  Neuro: Negative for weakness, abnormal sensation, seizure, frequent headaches, memory loss, confusion.  Psych: Negative for anxiety, depression, suicidal ideation, hallucinations.  Endo: Negative for unusual weight change.  Heme: Negative for  bruising or bleeding. Allergy: Negative for rash or hives.    Physical Examination:  BP 133/90   Pulse 80   Temp (!) 97 F (36.1 C) (Oral)   Ht 5\' 2"  (1.575 m)    General: Well-nourished, well-developed in no acute distress.  Head: Normocephalic, atraumatic.   Eyes: Conjunctiva pink, no icterus. Mouth: Oropharyngeal mucosa moist and pink , no lesions erythema or exudate. Neck: Supple without thyromegaly, masses, or lymphadenopathy.  Lungs: Clear to auscultation  bilaterally.  Heart: Regular rate and rhythm, no murmurs rubs or gallops.  Abdomen: Bowel sounds are normal, mild epig tenderness, small umb hernia easily reducible. Exam limited as had to be done in wheelchair.  no rebound or guarding.   Rectal: not performed Extremities: 1-2+ pitting lower extremity edema. No clubbing or deformities.  Neuro: Alert and oriented x 4 , grossly normal neurologically.  Skin: Warm and dry, no rash or jaundice.   Psych: Alert and cooperative, normal mood and affect.    Imaging Studies: No results found.

## 2017-10-28 NOTE — Progress Notes (Signed)
cc'd to pcp 

## 2017-10-28 NOTE — Telephone Encounter (Signed)
Cologuard order faxed to [facility]. [phone].

## 2017-10-28 NOTE — Progress Notes (Signed)
[facility] to [facility] [place].   Helmut MusterAlicia, please contact patient's nurse and let them know   "Seeing unabsorbed medications in your stool does not necessarily mean that there is anything wrong with your colon or digestion. Pharmaceutical companies make many different types of medication coatings from extended release coatings to cellulose capsules. In most cases, the body is absorbing the medications as it should and expelling the shell of the medication.  Long-acting or extended-release capsules have a special outer coating designed to be absorbed slowly. While the medication may be released, the shell may remain intact and be refilled with liquids from your intestinal tract.  It's similar to the digestion of corn kernels-your body digests the inner grain of corn, but the tough, fibrous husk is expelled. The liquids and waste from your body refill the corn husk-and likewise some medication "husks"-making them appear full and unabsorbed.  There is a simple way to determine if this is the case. If the medications are working or doing what they are prescribed to do, then you are most likely just seeing the empty capsule in your stool. For instance, if you are taking medications for diabetes and your blood sugar remains within a normal range, there is a good chance the medications are getting absorbed properly."  This is excerpt found when looking into her concerns about seeing medication beads in her stools. She is on only 3 capsules that may have beads (Embeda, Cymbalta, KCL).

## 2017-10-28 NOTE — Assessment & Plan Note (Addendum)
62 y/o female with chronic epigastric pain, nausea, uncontrolled GERD with vague esophageal dysphagia symptoms. May be related to complicated GERD, need to evaluate for gastritis/PUD, etc. Offered EGD+/-ED with propofol in near future.  I have discussed the risks, alternatives, benefits with regards to but not limited to the risk of reaction to medication, bleeding, infection, perforation and the patient is agreeable to proceed. Written consent to be obtained.  Change pantoprazole to 40mg  30 minutes before breakfast instead of at bedtime.    Patient also has concerns that her medication is not being absorbed, she reports seeing beads in her stool. She is on numerous medications, 3 are capsules formulations (Cymbalta, KCL, Embeda). Given no evidence of malabsorption or fast transit through her stool (ie frequent BMs or diarrhea) it is unlikely clinically significant. She is likely simply passing the shell or portions of the capsule that are not easily digested. If medication seems to be working appropriately, no further work up needed.

## 2017-11-05 NOTE — Progress Notes (Signed)
Faxed OV note to [phone] [facility] [place]

## 2017-11-05 NOTE — Progress Notes (Signed)
pts nurse notified and will look out for copy of pts ov note.

## 2017-11-07 DIAGNOSIS — M26622 Arthralgia of left temporomandibular joint: Secondary | ICD-10-CM | POA: Diagnosis not present

## 2017-11-07 DIAGNOSIS — H6983 Other specified disorders of Eustachian tube, bilateral: Secondary | ICD-10-CM | POA: Diagnosis not present

## 2017-11-07 DIAGNOSIS — J3489 Other specified disorders of nose and nasal sinuses: Secondary | ICD-10-CM | POA: Diagnosis not present

## 2017-11-12 DIAGNOSIS — F419 Anxiety disorder, unspecified: Secondary | ICD-10-CM | POA: Diagnosis not present

## 2017-11-12 DIAGNOSIS — F331 Major depressive disorder, recurrent, moderate: Secondary | ICD-10-CM | POA: Diagnosis not present

## 2017-11-12 DIAGNOSIS — F17210 Nicotine dependence, cigarettes, uncomplicated: Secondary | ICD-10-CM | POA: Diagnosis not present

## 2017-11-12 DIAGNOSIS — F5101 Primary insomnia: Secondary | ICD-10-CM | POA: Diagnosis not present

## 2017-11-12 NOTE — Patient Instructions (Signed)
Gloria Chapman  11/12/2017     @PREFPERIOPPHARMACY @   Your procedure is scheduled on  11/21/2017   Report to [facility] at  715   A.M.  Call this number if you have problems the morning of surgery:  [phone]   Remember:  Do not eat food or drink liquids after midnight.  Take these medicines the morning of surgery with A SIP OF WATER  Cymbalta, neurontin, ativan, dilaudid or embeda (if needed), zofran, ditropan, protonix. Use your inhaler before you come.   Do not wear jewelry, make-up or nail polish.  Do not wear lotions, powders, or perfumes, or deodorant.  Do not shave 48 hours prior to surgery.  Men may shave face and neck.  Do not bring valuables to the hospital.  [facility] is not responsible for any belongings or valuables.  Contacts, dentures or bridgework may not be worn into surgery.  Leave your suitcase in the car.  After surgery it may be brought to your room.  For patients admitted to the hospital, discharge time will be determined by your treatment team.  Patients discharged the day of surgery will not be allowed to drive home.   Name and phone number of your driver:   family Special instructions:  Follow the diet instructions given to you by Dr Luvenia Starch office.  Please read over the following fact sheets that you were given. Anesthesia Post-op Instructions and Care and Recovery After Surgery       Esophagogastroduodenoscopy Esophagogastroduodenoscopy (EGD) is a procedure to examine the lining of the esophagus, stomach, and first part of the small intestine (duodenum). This procedure is done to check for problems such as inflammation, bleeding, ulcers, or growths. During this procedure, a long, flexible, lighted tube with a camera attached (endoscope) is inserted down the throat. Tell a health care provider about:  Any allergies you have.  All medicines you are taking, including vitamins, herbs, eye drops, creams, and over-the-counter  medicines.  Any problems you or family members have had with anesthetic medicines.  Any blood disorders you have.  Any surgeries you have had.  Any medical conditions you have.  Whether you are pregnant or may be pregnant. What are the risks? Generally, this is a safe procedure. However, problems may occur, including:  Infection.  Bleeding.  A tear (perforation) in the esophagus, stomach, or duodenum.  Trouble breathing.  Excessive sweating.  Spasms of the larynx.  A slowed heartbeat.  Low blood pressure.  What happens before the procedure?  Follow instructions from your health care provider about eating or drinking restrictions.  Ask your health care provider about: ? Changing or stopping your regular medicines. This is especially important if you are taking diabetes medicines or blood thinners. ? Taking medicines such as aspirin and ibuprofen. These medicines can thin your blood. Do not take these medicines before your procedure if your health care provider instructs you not to.  Plan to have someone take you home after the procedure.  If you wear dentures, be ready to remove them before the procedure. What happens during the procedure?  To reduce your risk of infection, your health care team will wash or sanitize their hands.  An IV tube will be put in a vein in your hand or arm. You will get medicines and fluids through this tube.  You will be given one or more of the following: ? A medicine to help you relax (  sedative). ? A medicine to numb the area (local anesthetic). This medicine may be sprayed into your throat. It will make you feel more comfortable and keep you from gagging or coughing during the procedure. ? A medicine for pain.  A mouth guard may be placed in your mouth to protect your teeth and to keep you from biting on the endoscope.  You will be asked to lie on your left side.  The endoscope will be lowered down your throat into your esophagus,  stomach, and duodenum.  Air will be put into the endoscope. This will help your health care provider see better.  The lining of your esophagus, stomach, and duodenum will be examined.  Your health care provider may: ? Take a tissue sample so it can be looked at in a lab (biopsy). ? Remove growths. ? Remove objects (foreign bodies) that are stuck. ? Treat any bleeding with medicines or other devices that stop tissue from bleeding. ? Widen (dilate) or stretch narrowed areas of your esophagus and stomach.  The endoscope will be taken out. The procedure may vary among health care providers and hospitals. What happens after the procedure?  Your blood pressure, heart rate, breathing rate, and blood oxygen level will be monitored often until the medicines you were given have worn off.  Do not eat or drink anything until the numbing medicine has worn off and your gag reflex has returned. This information is not intended to replace advice given to you by your health care provider. Make sure you discuss any questions you have with your health care provider. Document Released: 01/04/2005 Document Revised: 02/09/2016 Document Reviewed: 07/28/2015 Elsevier Interactive Patient Education  2018 [employer]. Esophagogastroduodenoscopy, Care After Refer to this sheet in the next few weeks. These instructions provide you with information about caring for yourself after your procedure. Your health care provider may also give you more specific instructions. Your treatment has been planned according to current medical practices, but problems sometimes occur. Call your health care provider if you have any problems or questions after your procedure. What can I expect after the procedure? After the procedure, it is common to have:  A sore throat.  Nausea.  Bloating.  Dizziness.  Fatigue.  Follow these instructions at home:  Do not eat or drink anything until the numbing medicine (local anesthetic)  has worn off and your gag reflex has returned. You will know that the local anesthetic has worn off when you can swallow comfortably.  Do not drive for 24 hours if you received a medicine to help you relax (sedative).  If your health care provider took a tissue sample for testing during the procedure, make sure to get your test results. This is your responsibility. Ask your health care provider or the department performing the test when your results will be ready.  Keep all follow-up visits as told by your health care provider. This is important. Contact a health care provider if:  You cannot stop coughing.  You are not urinating.  You are urinating less than usual. Get help right away if:  You have trouble swallowing.  You cannot eat or drink.  You have throat or chest pain that gets worse.  You are dizzy or light-headed.  You faint.  You have nausea or vomiting.  You have chills.  You have a fever.  You have severe abdominal pain.  You have black, tarry, or bloody stools. This information is not intended to replace advice given to you by  your health care provider. Make sure you discuss any questions you have with your health care provider. Document Released: 08/20/2012 Document Revised: 02/09/2016 Document Reviewed: 07/28/2015 Elsevier Interactive Patient Education  2018 [employer]  Esophageal Dilatation Esophageal dilatation is a procedure to open a blocked or narrowed part of the esophagus. The esophagus is the long tube in your throat that carries food and liquid from your mouth to your stomach. The procedure is also called esophageal dilation. You may need this procedure if you have a buildup of scar tissue in your esophagus that makes it difficult, painful, or even impossible to swallow. This can be caused by gastroesophageal reflux disease (GERD). In rare cases, people need this procedure because they have cancer of the esophagus or a problem with the way food  moves through the esophagus. Sometimes you may need to have another dilatation to enlarge the opening of the esophagus gradually. Tell a health care provider about:  Any allergies you have.  All medicines you are taking, including vitamins, herbs, eye drops, creams, and over-the-counter medicines.  Any problems you or family members have had with anesthetic medicines.  Any blood disorders you have.  Any surgeries you have had.  Any medical conditions you have.  Any antibiotic medicines you are required to take before dental procedures. What are the risks? Generally, this is a safe procedure. However, problems can occur and include:  Bleeding from a tear in the lining of the esophagus.  A hole (perforation) in the esophagus.  What happens before the procedure?  Do not eat or drink anything after midnight on the night before the procedure or as directed by your health care provider.  Ask your health care provider about changing or stopping your regular medicines. This is especially important if you are taking diabetes medicines or blood thinners.  Plan to have someone take you home after the procedure. What happens during the procedure?  You will be given a medicine that makes you relaxed and sleepy (sedative).  A medicine may be sprayed or gargled to numb the back of the throat.  Your health care provider can use various instruments to do an esophageal dilatation. During the procedure, the instrument used will be placed in your mouth and passed down into your esophagus. Options include: ? Simple dilators. This instrument is carefully placed in the esophagus to stretch it. ? Guided wire bougies. In this method, a flexible tube (endoscope) is used to insert a wire into the esophagus. The dilator is passed over this wire to enlarge the esophagus. Then the wire is removed. ? Balloon dilators. An endoscope with a small balloon at the end is passed down into the esophagus. Inflating  the balloon gently stretches the esophagus and opens it up. What happens after the procedure?  Your blood pressure, heart rate, breathing rate, and blood oxygen level will be monitored often until the medicines you were given have worn off.  Your throat may feel slightly sore and will probably still feel numb. This will improve slowly over time.  You will not be allowed to eat or drink until the throat numbness has resolved.  If this is a same-day procedure, you may be allowed to go home once you have been able to drink, urinate, and sit on the edge of the bed without nausea or dizziness.  If this is a same-day procedure, you should have a friend or family member with you for the next 24 hours after the procedure. This information is not  intended to replace advice given to you by your health care provider. Make sure you discuss any questions you have with your health care provider. Document Released: 10/25/2005 Document Revised: 02/09/2016 Document Reviewed: 01/13/2014 Elsevier Interactive Patient Education  2018 [employer] Inc[employer]  Monitored Anesthesia Care Anesthesia is a term that refers to techniques, procedures, and medicines that help a person stay safe and comfortable during a medical procedure. Monitored anesthesia care, or sedation, is one type of anesthesia. Your anesthesia specialist may recommend sedation if you will be having a procedure that does not require you to be unconscious, such as:  Cataract surgery.  A dental procedure.  A biopsy.  A colonoscopy.  During the procedure, you may receive a medicine to help you relax (sedative). There are three levels of sedation:  Mild sedation. At this level, you may feel awake and relaxed. You will be able to follow directions.  Moderate sedation. At this level, you will be sleepy. You may not remember the procedure.  Deep sedation. At this level, you will be asleep. You will not remember the procedure.  The more medicine you are  given, the deeper your level of sedation will be. Depending on how you respond to the procedure, the anesthesia specialist may change your level of sedation or the type of anesthesia to fit your needs. An anesthesia specialist will monitor you closely during the procedure. Let your health care provider know about:  Any allergies you have.  All medicines you are taking, including vitamins, herbs, eye drops, creams, and over-the-counter medicines.  Any use of steroids (by mouth or as a cream).  Any problems you or family members have had with sedatives and anesthetic medicines.  Any blood disorders you have.  Any surgeries you have had.  Any medical conditions you have, such as sleep apnea.  Whether you are pregnant or may be pregnant.  Any use of cigarettes, alcohol, or street drugs. What are the risks? Generally, this is a safe procedure. However, problems may occur, including:  Getting too much medicine (oversedation).  Nausea.  Allergic reaction to medicines.  Trouble breathing. If this happens, a breathing tube may be used to help with breathing. It will be removed when you are awake and breathing on your own.  Heart trouble.  Lung trouble.  Before the procedure Staying hydrated Follow instructions from your health care provider about hydration, which may include:  Up to 2 hours before the procedure - you may continue to drink clear liquids, such as water, clear fruit juice, black coffee, and plain tea.  Eating and drinking restrictions Follow instructions from your health care provider about eating and drinking, which may include:  8 hours before the procedure - stop eating heavy meals or foods such as meat, fried foods, or fatty foods.  6 hours before the procedure - stop eating light meals or foods, such as toast or cereal.  6 hours before the procedure - stop drinking milk or drinks that contain milk.  2 hours before the procedure - stop drinking clear  liquids.  Medicines Ask your health care provider about:  Changing or stopping your regular medicines. This is especially important if you are taking diabetes medicines or blood thinners.  Taking medicines such as aspirin and ibuprofen. These medicines can thin your blood. Do not take these medicines before your procedure if your health care provider instructs you not to.  Tests and exams  You will have a physical exam.  You may have blood tests done  to show: ? How well your kidneys and liver are working. ? How well your blood can clot.  General instructions  Plan to have someone take you home from the hospital or clinic.  If you will be going home right after the procedure, plan to have someone with you for 24 hours.  What happens during the procedure?  Your blood pressure, heart rate, breathing, level of pain and overall condition will be monitored.  An IV tube will be inserted into one of your veins.  Your anesthesia specialist will give you medicines as needed to keep you comfortable during the procedure. This may mean changing the level of sedation.  The procedure will be performed. After the procedure  Your blood pressure, heart rate, breathing rate, and blood oxygen level will be monitored until the medicines you were given have worn off.  Do not drive for 24 hours if you received a sedative.  You may: ? Feel sleepy, clumsy, or nauseous. ? Feel forgetful about what happened after the procedure. ? Have a sore throat if you had a breathing tube during the procedure. ? Vomit. This information is not intended to replace advice given to you by your health care provider. Make sure you discuss any questions you have with your health care provider. Document Released: 05/30/2005 Document Revised: 02/10/2016 Document Reviewed: 12/25/2015 Elsevier Interactive Patient Education  2018 [employer] Monitored Anesthesia Care, Care After These instructions provide you with  information about caring for yourself after your procedure. Your health care provider may also give you more specific instructions. Your treatment has been planned according to current medical practices, but problems sometimes occur. Call your health care provider if you have any problems or questions after your procedure. What can I expect after the procedure? After your procedure, it is common to:  Feel sleepy for several hours.  Feel clumsy and have poor balance for several hours.  Feel forgetful about what happened after the procedure.  Have poor judgment for several hours.  Feel nauseous or vomit.  Have a sore throat if you had a breathing tube during the procedure.  Follow these instructions at home: For at least 24 hours after the procedure:   Do not: ? Participate in activities in which you could fall or become injured. ? Drive. ? Use heavy machinery. ? Drink alcohol. ? Take sleeping pills or medicines that cause drowsiness. ? Make important decisions or sign legal documents. ? Take care of children on your own.  Rest. Eating and drinking  Follow the diet that is recommended by your health care provider.  If you vomit, drink water, juice, or soup when you can drink without vomiting.  Make sure you have little or no nausea before eating solid foods. General instructions  Have a responsible adult stay with you until you are awake and alert.  Take over-the-counter and prescription medicines only as told by your health care provider.  If you smoke, do not smoke without supervision.  Keep all follow-up visits as told by your health care provider. This is important. Contact a health care provider if:  You keep feeling nauseous or you keep vomiting.  You feel light-headed.  You develop a rash.  You have a fever. Get help right away if:  You have trouble breathing. This information is not intended to replace advice given to you by your health care provider.  Make sure you discuss any questions you have with your health care provider. Document Released: 12/25/2015 Document Revised: 04/25/2016  Document Reviewed: 12/25/2015 Elsevier Interactive Patient Education  [employer].

## 2017-11-15 ENCOUNTER — Other Ambulatory Visit: Payer: Self-pay

## 2017-11-15 ENCOUNTER — Encounter (HOSPITAL_COMMUNITY): Payer: Self-pay | Admitting: *Deleted

## 2017-11-15 ENCOUNTER — Encounter (HOSPITAL_COMMUNITY)
Admission: RE | Admit: 2017-11-15 | Discharge: 2017-11-15 | Disposition: A | Discharge status: Home / Self Care | Payer: Medicare Other | Source: Ambulatory Visit | Attending: Internal Medicine | Admitting: Internal Medicine

## 2017-11-15 DIAGNOSIS — Z01810 Encounter for preprocedural cardiovascular examination: Secondary | ICD-10-CM | POA: Insufficient documentation

## 2017-11-15 DIAGNOSIS — Z01812 Encounter for preprocedural laboratory examination: Secondary | ICD-10-CM | POA: Diagnosis not present

## 2017-11-15 HISTORY — DX: Unspecified osteoarthritis, unspecified site: M19.90

## 2017-11-15 HISTORY — DX: Inflammatory liver disease, unspecified: K75.9

## 2017-11-15 HISTORY — DX: Anemia, unspecified: D64.9

## 2017-11-15 HISTORY — DX: Dyspnea, unspecified: R06.00

## 2017-11-15 HISTORY — DX: Pneumonia, unspecified organism: J18.9

## 2017-11-15 HISTORY — DX: Headache, unspecified: R51.9

## 2017-11-15 HISTORY — DX: Headache: R51

## 2017-11-15 HISTORY — DX: Fibromyalgia: M79.7

## 2017-11-15 HISTORY — DX: Cardiac murmur, unspecified: R01.1

## 2017-11-15 LAB — BASIC METABOLIC PANEL
ANION GAP: 13 (ref 5–15)
BUN: 9 mg/dL (ref 6–20)
CALCIUM: 8.8 mg/dL — AB (ref 8.9–10.3)
CO2: 28 mmol/L (ref 22–32)
Chloride: 92 mmol/L — ABNORMAL LOW (ref 101–111)
Creatinine, Ser: 0.75 mg/dL (ref 0.44–1.00)
Glucose, Bld: 354 mg/dL — ABNORMAL HIGH (ref 65–99)
Potassium: 3.9 mmol/L (ref 3.5–5.1)
SODIUM: 133 mmol/L — AB (ref 135–145)

## 2017-11-15 LAB — CBC
HEMATOCRIT: 47.1 % — AB (ref 36.0–46.0)
Hemoglobin: 15.3 g/dL — ABNORMAL HIGH (ref 12.0–15.0)
MCH: 29.7 pg (ref 26.0–34.0)
MCHC: 32.5 g/dL (ref 30.0–36.0)
MCV: 91.5 fL (ref 78.0–100.0)
PLATELETS: 164 10*3/uL (ref 150–400)
RBC: 5.15 MIL/uL — ABNORMAL HIGH (ref 3.87–5.11)
RDW: 13.8 % (ref 11.5–15.5)
WBC: 7.7 10*3/uL (ref 4.0–10.5)

## 2017-11-15 LAB — SURGICAL PCR SCREEN
MRSA, PCR: NEGATIVE
STAPHYLOCOCCUS AUREUS: POSITIVE — AB

## 2017-11-15 MED ORDER — MUPIROCIN 2 % EX OINT
TOPICAL_OINTMENT | CUTANEOUS | Status: AC
  Filled 2017-11-15: qty 22

## 2017-11-15 NOTE — Pre-Procedure Instructions (Signed)
Lab calls with positive SA and glucose of [phone]. Wynne DustEric Gill, PA notified. He wants patient to have fasting glucose at Nursing home and have them fax results to us at [phone]. [facility] [place] [facility] spoke [facility] Morrie SheldonAshley, RN concerning this. They will fax this in morning. They have M[facility] there and will start treating patient for staph.

## 2017-11-19 DIAGNOSIS — E119 Type 2 diabetes mellitus without complications: Secondary | ICD-10-CM | POA: Diagnosis not present

## 2017-11-21 ENCOUNTER — Encounter (HOSPITAL_COMMUNITY): Payer: Self-pay | Admitting: *Deleted

## 2017-11-21 ENCOUNTER — Encounter (HOSPITAL_COMMUNITY)
Admission: RE | Disposition: A | Discharge status: Home / Self Care | Payer: Self-pay | Source: Ambulatory Visit | Attending: Internal Medicine

## 2017-11-21 ENCOUNTER — Ambulatory Visit (HOSPITAL_COMMUNITY)
Admission: RE | Admit: 2017-11-21 | Discharge: 2017-11-21 | Disposition: A | Discharge status: Home / Self Care | Payer: Medicare Other | Source: Ambulatory Visit | Attending: Internal Medicine | Admitting: Internal Medicine

## 2017-11-21 ENCOUNTER — Ambulatory Visit (HOSPITAL_COMMUNITY): Payer: Medicare Other | Admitting: Anesthesiology

## 2017-11-21 DIAGNOSIS — R1013 Epigastric pain: Secondary | ICD-10-CM

## 2017-11-21 DIAGNOSIS — K222 Esophageal obstruction: Secondary | ICD-10-CM | POA: Insufficient documentation

## 2017-11-21 DIAGNOSIS — F5101 Primary insomnia: Secondary | ICD-10-CM | POA: Diagnosis not present

## 2017-11-21 DIAGNOSIS — R1310 Dysphagia, unspecified: Secondary | ICD-10-CM | POA: Diagnosis not present

## 2017-11-21 DIAGNOSIS — K209 Esophagitis, unspecified: Secondary | ICD-10-CM | POA: Diagnosis not present

## 2017-11-21 DIAGNOSIS — K449 Diaphragmatic hernia without obstruction or gangrene: Secondary | ICD-10-CM | POA: Diagnosis not present

## 2017-11-21 DIAGNOSIS — R110 Nausea: Secondary | ICD-10-CM | POA: Diagnosis not present

## 2017-11-21 DIAGNOSIS — M797 Fibromyalgia: Secondary | ICD-10-CM | POA: Insufficient documentation

## 2017-11-21 DIAGNOSIS — Z7982 Long term (current) use of aspirin: Secondary | ICD-10-CM | POA: Insufficient documentation

## 2017-11-21 DIAGNOSIS — F329 Major depressive disorder, single episode, unspecified: Secondary | ICD-10-CM | POA: Insufficient documentation

## 2017-11-21 DIAGNOSIS — K219 Gastro-esophageal reflux disease without esophagitis: Secondary | ICD-10-CM | POA: Diagnosis not present

## 2017-11-21 DIAGNOSIS — F17210 Nicotine dependence, cigarettes, uncomplicated: Secondary | ICD-10-CM | POA: Insufficient documentation

## 2017-11-21 DIAGNOSIS — K210 Gastro-esophageal reflux disease with esophagitis: Secondary | ICD-10-CM | POA: Diagnosis not present

## 2017-11-21 DIAGNOSIS — F331 Major depressive disorder, recurrent, moderate: Secondary | ICD-10-CM | POA: Diagnosis not present

## 2017-11-21 DIAGNOSIS — J449 Chronic obstructive pulmonary disease, unspecified: Secondary | ICD-10-CM | POA: Diagnosis not present

## 2017-11-21 DIAGNOSIS — F419 Anxiety disorder, unspecified: Secondary | ICD-10-CM | POA: Diagnosis not present

## 2017-11-21 DIAGNOSIS — Z79899 Other long term (current) drug therapy: Secondary | ICD-10-CM | POA: Diagnosis not present

## 2017-11-21 HISTORY — PX: MALONEY DILATION: SHX5535

## 2017-11-21 HISTORY — PX: ESOPHAGOGASTRODUODENOSCOPY (EGD) WITH PROPOFOL: SHX5813

## 2017-11-21 LAB — GLUCOSE, CAPILLARY
GLUCOSE-CAPILLARY: 212 mg/dL — AB (ref 65–99)
GLUCOSE-CAPILLARY: 192 mg/dL — AB (ref 65–99)

## 2017-11-21 SURGERY — ESOPHAGOGASTRODUODENOSCOPY (EGD) WITH PROPOFOL
Anesthesia: Monitor Anesthesia Care

## 2017-11-21 MED ORDER — CHLORHEXIDINE GLUCONATE CLOTH 2 % EX PADS: 6.0000 | MEDICATED_PAD | Freq: Once | CUTANEOUS | Status: DC

## 2017-11-21 MED ORDER — ONDANSETRON HCL 4 MG/2ML IJ SOLN
4.0000 mg | Freq: Once | INTRAMUSCULAR | Status: AC
  Administered 2017-11-21: 4 mg via INTRAVENOUS

## 2017-11-21 MED ORDER — LIDOCAINE VISCOUS 2 % MT SOLN
3.0000 mL | OROMUCOSAL | Status: AC | PRN
  Administered 2017-11-21 (×2): 3 mL via OROMUCOSAL

## 2017-11-21 MED ORDER — GLYCOPYRROLATE 0.2 MG/ML IJ SOLN
0.2000 mg | Freq: Once | INTRAMUSCULAR | Status: AC | PRN
  Administered 2017-11-21: 0.2 mg via INTRAVENOUS
  Filled 2017-11-21: qty 1

## 2017-11-21 MED ORDER — LACTATED RINGERS IV SOLN
INTRAVENOUS | Status: DC
  Administered 2017-11-21: 1000 mL via INTRAVENOUS

## 2017-11-21 MED ORDER — PROPOFOL 500 MG/50ML IV EMUL
INTRAVENOUS | Status: DC | PRN
  Administered 2017-11-21: 100 ug/kg/min via INTRAVENOUS

## 2017-11-21 MED ORDER — FENTANYL CITRATE (PF) 100 MCG/2ML IJ SOLN
INTRAMUSCULAR | Status: AC
  Filled 2017-11-21: qty 2

## 2017-11-21 MED ORDER — LIDOCAINE VISCOUS 2 % MT SOLN
OROMUCOSAL | Status: AC
  Filled 2017-11-21: qty 15

## 2017-11-21 MED ORDER — PROPOFOL 10 MG/ML IV BOLUS
INTRAVENOUS | Status: AC
  Filled 2017-11-21: qty 40

## 2017-11-21 MED ORDER — ONDANSETRON HCL 4 MG/2ML IJ SOLN
INTRAMUSCULAR | Status: AC
  Filled 2017-11-21: qty 2

## 2017-11-21 MED ORDER — MIDAZOLAM HCL 2 MG/2ML IJ SOLN
1.0000 mg | INTRAMUSCULAR | Status: AC
  Administered 2017-11-21: 2 mg via INTRAVENOUS
  Filled 2017-11-21: qty 2

## 2017-11-21 MED ORDER — FENTANYL CITRATE (PF) 100 MCG/2ML IJ SOLN
25.0000 ug | Freq: Once | INTRAMUSCULAR | Status: AC
  Administered 2017-11-21: 25 ug via INTRAVENOUS

## 2017-11-21 NOTE — Transfer of Care (Signed)
Immediate Anesthesia Transfer of Care Note  Patient: Gloria Chapman  Procedure(s) Performed: ESOPHAGOGASTRODUODENOSCOPY (EGD) WITH PROPOFOL (N/A ) MALONEY DILATION (N/A )  Patient Location: PACU  Anesthesia Type:MAC  Level of Consciousness: awake and alert   Airway & Oxygen Therapy: Patient Spontanous Breathing and Patient connected to nasal cannula oxygen  Post-op Assessment: Report given to RN  Post vital signs: Reviewed and stable  Last Vitals:  Vitals:   11/21/17 0915 11/21/17 0930  BP: 117/68 116/70  Resp: (!) 9 10  Temp:    SpO2: 91% 92%    Last Pain:  Vitals:   11/21/17 0756  TempSrc: Oral  PainSc:       Patients Stated Pain Goal: 4 (38/18/40 3754)  Complications: No apparent anesthesia complications

## 2017-11-21 NOTE — Discharge Instructions (Signed)
EGD Discharge instructions Please read the instructions outlined below and refer to this sheet in the next few weeks. These discharge instructions provide you with general information on caring for yourself after you leave the hospital. Your doctor may also give you specific instructions. While your treatment has been planned according to the most current medical practices available, unavoidable complications occasionally occur. If you have any problems or questions after discharge, please call your doctor. ACTIVITY  You may resume your regular activity but move at a slower pace for the next 24 hours.   Take frequent rest periods for the next 24 hours.   Walking will help expel (get rid of) the air and reduce the bloated feeling in your abdomen.   No driving for 24 hours (because of the anesthesia (medicine) used during the test).   You may shower.   Do not sign any important legal documents or operate any machinery for 24 hours (because of the anesthesia used during the test).  NUTRITION  Drink plenty of fluids.   You may resume your normal diet.   Begin with a light meal and progress to your normal diet.   Avoid alcoholic beverages for 24 hours or as instructed by your caregiver.  MEDICATIONS  You may resume your normal medications unless your caregiver tells you otherwise.  WHAT YOU CAN EXPECT TODAY  You may experience abdominal discomfort such as a feeling of fullness or gas pains.  FOLLOW-UP  Your doctor will discuss the results of your test with you.  SEEK IMMEDIATE MEDICAL ATTENTION IF ANY OF THE FOLLOWING OCCUR:  Excessive nausea (feeling sick to your stomach) and/or vomiting.   Severe abdominal pain and distention (swelling).   Trouble swallowing.   Temperature over 101 F (37.8 C).   Rectal bleeding or vomiting of blood.    GERD information provided  Increase Protonix to 40 mg twice daily  Office visit with us in 3 months      Gastroesophageal  Reflux Disease, Adult Normally, food travels down the esophagus and stays in the stomach to be digested. If a person has gastroesophageal reflux disease (GERD), food and stomach acid move back up into the esophagus. When this happens, the esophagus becomes sore and swollen (inflamed). Over time, GERD can make small holes (ulcers) in the lining of the esophagus. Follow these instructions at home: Diet  Follow a diet as told by your doctor. You may need to avoid foods and drinks such as: ? Coffee and tea (with or without caffeine). ? Drinks that contain alcohol. ? Energy drinks and sports drinks. ? Carbonated drinks or sodas. ? Chocolate and cocoa. ? Peppermint and mint flavorings. ? Garlic and onions. ? Horseradish. ? Spicy and acidic foods, such as peppers, chili powder, curry powder, vinegar, hot sauces, and BBQ sauce. ? Citrus fruit juices and citrus fruits, such as oranges, lemons, and limes. ? Tomato-based foods, such as red sauce, chili, salsa, and pizza with red sauce. ? Fried and fatty foods, such as donuts, french fries, potato chips, and high-fat dressings. ? High-fat meats, such as hot dogs, rib eye steak, sausage, ham, and bacon. ? High-fat dairy items, such as whole milk, butter, and cream cheese.  Eat small meals often. Avoid eating large meals.  Avoid drinking large amounts of liquid with your meals.  Avoid eating meals during the 2-3 hours before bedtime.  Avoid lying down right after you eat.  Do not exercise right after you eat. General instructions  Pay attention to any  changes in your symptoms.  Take over-the-counter and prescription medicines only as told by your doctor. Do not take aspirin, ibuprofen, or other NSAIDs unless your doctor says it is okay.  Do not use any tobacco products, including cigarettes, chewing tobacco, and e-cigarettes. If you need help quitting, ask your doctor.  Wear loose clothes. Do not wear anything tight around your  waist.  Raise (elevate) the head of your bed about 6 inches (15 cm).  Try to lower your stress. If you need help doing this, ask your doctor.  If you are overweight, lose an amount of weight that is healthy for you. Ask your doctor about a safe weight loss goal.  Keep all follow-up visits as told by your doctor. This is important. Contact a doctor if:  You have new symptoms.  You lose weight and you do not know why it is happening.  You have trouble swallowing, or it hurts to swallow.  You have wheezing or a cough that keeps happening.  Your symptoms do not get better with treatment.  You have a hoarse voice. Get help right away if:  You have pain in your arms, neck, jaw, teeth, or back.  You feel sweaty, dizzy, or light-headed.  You have chest pain or shortness of breath.  You throw up (vomit) and your throw up looks like blood or coffee grounds.  You pass out (faint).  Your poop (stool) is bloody or black.  You cannot swallow, drink, or eat. This information is not intended to replace advice given to you by your health care provider. Make sure you discuss any questions you have with your health care provider. Document Released: 02/20/2008 Document Revised: 02/09/2016 Document Reviewed: 12/29/2014 Elsevier Interactive Patient Education  2018 [employer].    PATIENT INSTRUCTIONS POST-ANESTHESIA  IMMEDIATELY FOLLOWING SURGERY:  Do not drive or operate machinery for the first twenty four hours after surgery.  Do not make any important decisions for twenty four hours after surgery or while taking narcotic pain medications or sedatives.  If you develop intractable nausea and vomiting or a severe headache please notify your doctor immediately.  FOLLOW-UP:  Please make an appointment with your surgeon as instructed. You do not need to follow up with anesthesia unless specifically instructed to do so.  WOUND CARE INSTRUCTIONS (if applicable):  Keep a dry clean dressing on  the anesthesia/puncture wound site if there is drainage.  Once the wound has quit draining you may leave it open to air.  Generally you should leave the bandage intact for twenty four hours unless there is drainage.  If the epidural site drains for more than 36-48 hours please call the anesthesia department.  QUESTIONS?:  Please feel free to call your physician or the hospital operator if you have any questions, and they will be happy to assist you.

## 2017-11-21 NOTE — Anesthesia Preprocedure Evaluation (Signed)
Anesthesia Evaluation  Patient identified by MRN, date of birth, ID band Patient awake    Reviewed: Allergy & Precautions, NPO status , Patient's Chart, lab work & pertinent test results  Airway Mallampati: II  TM Distance: >3 FB     Dental  (+) Edentulous Upper, Edentulous Lower   Pulmonary shortness of breath, COPD, Current Smoker,    breath sounds clear to auscultation       Cardiovascular negative cardio ROS   Rhythm:Regular Rate:Normal     Neuro/Psych  Headaches, PSYCHIATRIC DISORDERS Anxiety Depression    GI/Hepatic GERD  ,(+) Hepatitis -  Endo/Other  diabetes, Type 2Morbid obesity  Renal/GU      Musculoskeletal  (+) Arthritis , Fibromyalgia -  Abdominal   Peds  Hematology   Anesthesia Other Findings   Reproductive/Obstetrics                             Anesthesia Physical Anesthesia Plan  ASA: III  Anesthesia Plan: MAC   Post-op Pain Management:    Induction: Intravenous  PONV Risk Score and Plan:   Airway Management Planned: Simple Face Mask  Additional Equipment:   Intra-op Plan:   Post-operative Plan:   Informed Consent: I have reviewed the patients History and Physical, chart, labs and discussed the procedure including the risks, benefits and alternatives for the proposed anesthesia with the patient or authorized representative who has indicated his/her understanding and acceptance.     Plan Discussed with:   Anesthesia Plan Comments:         Anesthesia Quick Evaluation

## 2017-11-21 NOTE — Op Note (Signed)
[facility] Patient Name: Gloria PlumSharon Chapman Procedure Date: 11/21/2017 9:18 AM MRN: [id number] Date of Birth: 1956/03/06 Attending MD: Gennette Pacobert Michael Abbigael Detlefsen , MD CSN: [id number] Age: [age] Admit Type: Outpatient Procedure:                Upper GI endoscopy Indications:              Epigastric abdominal pain, Dysphagia Providers:                Gennette Pacobert Michael Victoriano Campion, MD, Nena PolioLisa Moore, RN, Dyann Ruddleonya                            Wilson Referring MD:              Medicines:                Propofol per Anesthesia Complications:            No immediate complications. Estimated Blood Loss:     Estimated blood loss was minimal. Procedure:                Pre-Anesthesia Assessment:                           - Prior to the procedure, a History and Physical                            was performed, and patient medications and                            allergies were reviewed. The patient's tolerance of                            previous anesthesia was also reviewed. The risks                            and benefits of the procedure and the sedation                            options and risks were discussed with the patient.                            All questions were answered, and informed consent                            was obtained. Prior Anticoagulants: The patient has                            taken no previous anticoagulant or antiplatelet                            agents. ASA Grade Assessment: II - A patient with                            mild systemic disease. After reviewing the risks  and benefits, the patient was deemed in                            satisfactory condition to undergo the procedure.                           After obtaining informed consent, the endoscope was                            passed under direct vision. Throughout the                            procedure, the patient's blood pressure, pulse, and                            oxygen  saturations were monitored continuously. The                            [id number]-[id number] ([id number]) scope was introduced through the                            and advanced to the second part of duodenum. The                            upper GI endoscopy was accomplished without                            difficulty. The patient tolerated the procedure                            well. Scope In: 9:47:29 AM Scope Out: 9:53:44 AM Total Procedure Duration: 0 hours 6 minutes 15 seconds  Findings:      Esophagitis was found. 5x5 mm inverted "v" erosion straddling the GE       junction.      A low-grade of narrowing Schatzki ring (acquired) was found at the       gastroesophageal junction.      A small hiatal hernia was present.      The duodenal bulb and second portion of the duodenum were normal. The       scope was withdrawn. Dilation was performed with a Maloney dilator with       no resistance at 54 Fr. The dilation site was examined following       endoscope reinsertion and showed moderate improvement in luminal       narrowing. Estimated blood loss was minimal. Impression:               - erosive reflux .                           - Low-grade of narrowing Schatzki ring. status post                            [facility] dilation.                           -  Small hiatal hernia.                           - Normal duodenal bulb and second portion of the                            duodenum.                           - No specimens collected. Moderate Sedation:      Moderate (conscious) sedation was personally administered by an       anesthesia professional. The following parameters were monitored: oxygen       saturation, heart rate, blood pressure, respiratory rate, EKG, adequacy       of pulmonary ventilation, and response to care. Total physician       intraservice time was 25 minutes. Recommendation:           - Patient has a contact number available for                            emergencies.  The signs and symptoms of potential                            delayed complications were discussed with the                            patient. Return to normal activities tomorrow.                            Written discharge instructions were provided to the                            patient.                           - Resume previous diet.                           - Continue present medications. Increase Protonix                            to 40 mg twice daily.                           - No repeat upper endoscopy.                           - Return to GI office in 3 months. Procedure Code(s):        --- Professional ---                           [phone], Esophagogastroduodenoscopy, flexible,                            transoral; diagnostic, including collection of  specimen(s) by brushing or washing, when performed                            (separate procedure)                           43450, Dilation of esophagus, by unguided sound or                            bougie, single or multiple passes Diagnosis Code(s):        --- Professional ---                           K20.9, Esophagitis, unspecified                           K22.2, Esophageal obstruction                           K44.9, Diaphragmatic hernia without obstruction or                            gangrene                           R10.13, Epigastric pain                           R13.10, Dysphagia, unspecified CPT copyright 2016 American Medical Association. All rights reserved. The codes documented in this report are preliminary and upon coder review may  be revised to meet current compliance requirements. Gerrit Friends. Lallie Strahm, MD Gennette Pac, MD 11/21/2017 10:02:19 AM This report has been signed electronically. Number of Addenda: 0

## 2017-11-21 NOTE — Interval H&P Note (Signed)
History and Physical Interval Note:  11/21/2017 8:21 AM  Orlene PlumSharon Nesler  has presented today for surgery, with the diagnosis of epigastric pain, nausea, GERD  The various methods of treatment have been discussed with the patient and family. After consideration of risks, benefits and other options for treatment, the patient has consented to  Procedure(s) with comments: ESOPHAGOGASTRODUODENOSCOPY (EGD) WITH PROPOFOL (N/A) - 9:30am MALONEY DILATION (N/A) as a surgical intervention .  The patient's history has been reviewed, patient examined, no change in status, stable for surgery.  I have reviewed the patient's chart and labs.  Questions were answered to the patient's satisfaction.     Jade Burright  No change.  EGD with possible esophageal dilation for abdominal pain and dysphagia per plan..  The risks, benefits, limitations, alternatives and imponderables have been reviewed with the patient. Potential for esophageal dilation, biopsy, etc. have also been reviewed.  Questions have been answered. All parties agreeable.

## 2017-11-21 NOTE — Anesthesia Postprocedure Evaluation (Signed)
Anesthesia Post Note  Patient: Gloria Chapman  Procedure(s) Performed: ESOPHAGOGASTRODUODENOSCOPY (EGD) WITH PROPOFOL (N/A ) MALONEY DILATION (N/A )  Patient location during evaluation: PACU Anesthesia Type: MAC Level of consciousness: awake and alert and oriented Pain management: pain level controlled Vital Signs Assessment: post-procedure vital signs reviewed and stable Respiratory status: spontaneous breathing Cardiovascular status: blood pressure returned to baseline and stable Postop Assessment: no apparent nausea or vomiting Anesthetic complications: no     Last Vitals:  Vitals:   11/21/17 0930 11/21/17 1001  BP: 116/70 (!) (P) 125/95  Pulse:  (P) 65  Resp: 10 (P) 16  Temp:  (P) 36.6 C  SpO2: 92% (P) 98%    Last Pain:  Vitals:   11/21/17 0756  TempSrc: Oral  PainSc:                  Tressie Stalker

## 2017-11-26 ENCOUNTER — Encounter (HOSPITAL_COMMUNITY): Payer: Self-pay | Admitting: Internal Medicine

## 2017-11-26 DIAGNOSIS — F17210 Nicotine dependence, cigarettes, uncomplicated: Secondary | ICD-10-CM | POA: Diagnosis not present

## 2017-11-26 DIAGNOSIS — F331 Major depressive disorder, recurrent, moderate: Secondary | ICD-10-CM | POA: Diagnosis not present

## 2017-11-26 DIAGNOSIS — F5101 Primary insomnia: Secondary | ICD-10-CM | POA: Diagnosis not present

## 2017-11-26 DIAGNOSIS — F419 Anxiety disorder, unspecified: Secondary | ICD-10-CM | POA: Diagnosis not present

## 2017-11-28 DIAGNOSIS — J449 Chronic obstructive pulmonary disease, unspecified: Secondary | ICD-10-CM | POA: Diagnosis not present

## 2017-11-28 DIAGNOSIS — N3281 Overactive bladder: Secondary | ICD-10-CM | POA: Diagnosis not present

## 2017-11-28 DIAGNOSIS — I509 Heart failure, unspecified: Secondary | ICD-10-CM | POA: Diagnosis not present

## 2017-11-28 DIAGNOSIS — M5430 Sciatica, unspecified side: Secondary | ICD-10-CM | POA: Diagnosis not present

## 2017-12-03 DIAGNOSIS — F419 Anxiety disorder, unspecified: Secondary | ICD-10-CM | POA: Diagnosis not present

## 2017-12-03 DIAGNOSIS — F331 Major depressive disorder, recurrent, moderate: Secondary | ICD-10-CM | POA: Diagnosis not present

## 2017-12-03 DIAGNOSIS — F17210 Nicotine dependence, cigarettes, uncomplicated: Secondary | ICD-10-CM | POA: Diagnosis not present

## 2017-12-03 DIAGNOSIS — F5101 Primary insomnia: Secondary | ICD-10-CM | POA: Diagnosis not present

## 2017-12-05 DIAGNOSIS — F17210 Nicotine dependence, cigarettes, uncomplicated: Secondary | ICD-10-CM | POA: Diagnosis not present

## 2017-12-05 DIAGNOSIS — F331 Major depressive disorder, recurrent, moderate: Secondary | ICD-10-CM | POA: Diagnosis not present

## 2017-12-05 DIAGNOSIS — F5101 Primary insomnia: Secondary | ICD-10-CM | POA: Diagnosis not present

## 2017-12-09 DIAGNOSIS — J449 Chronic obstructive pulmonary disease, unspecified: Secondary | ICD-10-CM | POA: Diagnosis not present

## 2017-12-09 DIAGNOSIS — F17210 Nicotine dependence, cigarettes, uncomplicated: Secondary | ICD-10-CM | POA: Diagnosis not present

## 2017-12-09 DIAGNOSIS — J309 Allergic rhinitis, unspecified: Secondary | ICD-10-CM | POA: Diagnosis not present

## 2017-12-11 DIAGNOSIS — F5101 Primary insomnia: Secondary | ICD-10-CM | POA: Diagnosis not present

## 2017-12-11 DIAGNOSIS — F331 Major depressive disorder, recurrent, moderate: Secondary | ICD-10-CM | POA: Diagnosis not present

## 2017-12-11 DIAGNOSIS — F419 Anxiety disorder, unspecified: Secondary | ICD-10-CM | POA: Diagnosis not present

## 2017-12-11 DIAGNOSIS — F17210 Nicotine dependence, cigarettes, uncomplicated: Secondary | ICD-10-CM | POA: Diagnosis not present

## 2017-12-12 DIAGNOSIS — J449 Chronic obstructive pulmonary disease, unspecified: Secondary | ICD-10-CM | POA: Diagnosis not present

## 2017-12-16 DIAGNOSIS — F17210 Nicotine dependence, cigarettes, uncomplicated: Secondary | ICD-10-CM | POA: Diagnosis not present

## 2017-12-16 DIAGNOSIS — F331 Major depressive disorder, recurrent, moderate: Secondary | ICD-10-CM | POA: Diagnosis not present

## 2017-12-16 DIAGNOSIS — F419 Anxiety disorder, unspecified: Secondary | ICD-10-CM | POA: Diagnosis not present

## 2017-12-16 DIAGNOSIS — F5101 Primary insomnia: Secondary | ICD-10-CM | POA: Diagnosis not present

## 2017-12-20 DIAGNOSIS — M5430 Sciatica, unspecified side: Secondary | ICD-10-CM | POA: Diagnosis not present

## 2017-12-20 DIAGNOSIS — I509 Heart failure, unspecified: Secondary | ICD-10-CM | POA: Diagnosis not present

## 2017-12-20 DIAGNOSIS — J209 Acute bronchitis, unspecified: Secondary | ICD-10-CM | POA: Diagnosis not present

## 2017-12-20 DIAGNOSIS — K580 Irritable bowel syndrome with diarrhea: Secondary | ICD-10-CM | POA: Diagnosis not present

## 2017-12-20 DIAGNOSIS — N3281 Overactive bladder: Secondary | ICD-10-CM | POA: Diagnosis not present

## 2017-12-20 DIAGNOSIS — G9009 Other idiopathic peripheral autonomic neuropathy: Secondary | ICD-10-CM | POA: Diagnosis not present

## 2017-12-20 DIAGNOSIS — R54 Age-related physical debility: Secondary | ICD-10-CM | POA: Diagnosis not present

## 2017-12-20 DIAGNOSIS — G894 Chronic pain syndrome: Secondary | ICD-10-CM | POA: Diagnosis not present

## 2017-12-20 DIAGNOSIS — F17210 Nicotine dependence, cigarettes, uncomplicated: Secondary | ICD-10-CM | POA: Diagnosis not present

## 2017-12-20 DIAGNOSIS — J309 Allergic rhinitis, unspecified: Secondary | ICD-10-CM | POA: Diagnosis not present

## 2017-12-20 DIAGNOSIS — J449 Chronic obstructive pulmonary disease, unspecified: Secondary | ICD-10-CM | POA: Diagnosis not present

## 2017-12-20 DIAGNOSIS — L853 Xerosis cutis: Secondary | ICD-10-CM | POA: Diagnosis not present

## 2017-12-25 DIAGNOSIS — F5101 Primary insomnia: Secondary | ICD-10-CM | POA: Diagnosis not present

## 2017-12-25 DIAGNOSIS — F17210 Nicotine dependence, cigarettes, uncomplicated: Secondary | ICD-10-CM | POA: Diagnosis not present

## 2017-12-25 DIAGNOSIS — F331 Major depressive disorder, recurrent, moderate: Secondary | ICD-10-CM | POA: Diagnosis not present

## 2017-12-25 DIAGNOSIS — F419 Anxiety disorder, unspecified: Secondary | ICD-10-CM | POA: Diagnosis not present

## 2017-12-27 ENCOUNTER — Telehealth: Payer: Self-pay

## 2017-12-27 NOTE — Telephone Encounter (Signed)
Please contact nursing home and ask they complete Cologuard as ordered.

## 2017-12-27 NOTE — Telephone Encounter (Signed)
Called nursing home and spoke to pt's nurse, Danae Chen. The nurse states she is new to the facility and was unsure of what Cologuard is. Explained to her the kit would have been delivered to the facility. She states she will check with her DON.

## 2017-12-27 NOTE — Telephone Encounter (Signed)
[facility]. Pt hasn't completed Cologuard test.

## 2018-01-02 DIAGNOSIS — F419 Anxiety disorder, unspecified: Secondary | ICD-10-CM | POA: Diagnosis not present

## 2018-01-02 DIAGNOSIS — F5101 Primary insomnia: Secondary | ICD-10-CM | POA: Diagnosis not present

## 2018-01-02 DIAGNOSIS — F17210 Nicotine dependence, cigarettes, uncomplicated: Secondary | ICD-10-CM | POA: Diagnosis not present

## 2018-01-02 DIAGNOSIS — F331 Major depressive disorder, recurrent, moderate: Secondary | ICD-10-CM | POA: Diagnosis not present

## 2018-01-03 DIAGNOSIS — E119 Type 2 diabetes mellitus without complications: Secondary | ICD-10-CM | POA: Diagnosis not present

## 2018-01-03 DIAGNOSIS — D649 Anemia, unspecified: Secondary | ICD-10-CM | POA: Diagnosis not present

## 2018-01-06 DIAGNOSIS — F17210 Nicotine dependence, cigarettes, uncomplicated: Secondary | ICD-10-CM | POA: Diagnosis not present

## 2018-01-06 DIAGNOSIS — F331 Major depressive disorder, recurrent, moderate: Secondary | ICD-10-CM | POA: Diagnosis not present

## 2018-01-06 DIAGNOSIS — F5101 Primary insomnia: Secondary | ICD-10-CM | POA: Diagnosis not present

## 2018-01-06 DIAGNOSIS — F419 Anxiety disorder, unspecified: Secondary | ICD-10-CM | POA: Diagnosis not present

## 2018-01-10 DIAGNOSIS — F419 Anxiety disorder, unspecified: Secondary | ICD-10-CM | POA: Diagnosis not present

## 2018-01-10 DIAGNOSIS — J441 Chronic obstructive pulmonary disease with (acute) exacerbation: Secondary | ICD-10-CM | POA: Diagnosis not present

## 2018-01-10 DIAGNOSIS — K219 Gastro-esophageal reflux disease without esophagitis: Secondary | ICD-10-CM | POA: Diagnosis not present

## 2018-01-10 DIAGNOSIS — M62838 Other muscle spasm: Secondary | ICD-10-CM | POA: Diagnosis not present

## 2018-01-10 DIAGNOSIS — F5101 Primary insomnia: Secondary | ICD-10-CM | POA: Diagnosis not present

## 2018-01-10 DIAGNOSIS — G8929 Other chronic pain: Secondary | ICD-10-CM | POA: Diagnosis not present

## 2018-01-10 DIAGNOSIS — F17210 Nicotine dependence, cigarettes, uncomplicated: Secondary | ICD-10-CM | POA: Diagnosis not present

## 2018-01-10 DIAGNOSIS — F331 Major depressive disorder, recurrent, moderate: Secondary | ICD-10-CM | POA: Diagnosis not present

## 2018-01-10 DIAGNOSIS — R0602 Shortness of breath: Secondary | ICD-10-CM | POA: Diagnosis not present

## 2018-01-10 DIAGNOSIS — F321 Major depressive disorder, single episode, moderate: Secondary | ICD-10-CM | POA: Diagnosis not present

## 2018-01-10 DIAGNOSIS — J4521 Mild intermittent asthma with (acute) exacerbation: Secondary | ICD-10-CM | POA: Diagnosis not present

## 2018-01-10 DIAGNOSIS — D649 Anemia, unspecified: Secondary | ICD-10-CM | POA: Diagnosis not present

## 2018-01-14 DIAGNOSIS — N3281 Overactive bladder: Secondary | ICD-10-CM | POA: Diagnosis not present

## 2018-01-14 DIAGNOSIS — E119 Type 2 diabetes mellitus without complications: Secondary | ICD-10-CM | POA: Diagnosis not present

## 2018-01-14 DIAGNOSIS — I509 Heart failure, unspecified: Secondary | ICD-10-CM | POA: Diagnosis not present

## 2018-01-14 DIAGNOSIS — J449 Chronic obstructive pulmonary disease, unspecified: Secondary | ICD-10-CM | POA: Diagnosis not present

## 2018-01-16 DIAGNOSIS — F5101 Primary insomnia: Secondary | ICD-10-CM | POA: Diagnosis not present

## 2018-01-16 DIAGNOSIS — F17210 Nicotine dependence, cigarettes, uncomplicated: Secondary | ICD-10-CM | POA: Diagnosis not present

## 2018-01-16 DIAGNOSIS — F331 Major depressive disorder, recurrent, moderate: Secondary | ICD-10-CM | POA: Diagnosis not present

## 2018-01-16 DIAGNOSIS — F419 Anxiety disorder, unspecified: Secondary | ICD-10-CM | POA: Diagnosis not present

## 2018-01-17 DIAGNOSIS — Z79899 Other long term (current) drug therapy: Secondary | ICD-10-CM | POA: Diagnosis not present

## 2018-01-17 DIAGNOSIS — R319 Hematuria, unspecified: Secondary | ICD-10-CM | POA: Diagnosis not present

## 2018-01-17 DIAGNOSIS — N390 Urinary tract infection, site not specified: Secondary | ICD-10-CM | POA: Diagnosis not present

## 2018-01-30 DIAGNOSIS — E119 Type 2 diabetes mellitus without complications: Secondary | ICD-10-CM | POA: Diagnosis not present

## 2018-01-30 DIAGNOSIS — R1010 Upper abdominal pain, unspecified: Secondary | ICD-10-CM | POA: Diagnosis not present

## 2018-01-31 ENCOUNTER — Encounter: Payer: Self-pay | Admitting: Gastroenterology

## 2018-01-31 DIAGNOSIS — R160 Hepatomegaly, not elsewhere classified: Secondary | ICD-10-CM | POA: Diagnosis not present

## 2018-01-31 DIAGNOSIS — R109 Unspecified abdominal pain: Secondary | ICD-10-CM | POA: Diagnosis not present

## 2018-02-01 DIAGNOSIS — Z79899 Other long term (current) drug therapy: Secondary | ICD-10-CM | POA: Diagnosis not present

## 2018-02-01 DIAGNOSIS — E7800 Pure hypercholesterolemia, unspecified: Secondary | ICD-10-CM | POA: Diagnosis not present

## 2018-02-06 DIAGNOSIS — M25551 Pain in right hip: Secondary | ICD-10-CM | POA: Diagnosis not present

## 2018-02-07 DIAGNOSIS — R350 Frequency of micturition: Secondary | ICD-10-CM | POA: Diagnosis not present

## 2018-02-07 DIAGNOSIS — N3281 Overactive bladder: Secondary | ICD-10-CM | POA: Diagnosis not present

## 2018-02-07 DIAGNOSIS — Z87440 Personal history of urinary (tract) infections: Secondary | ICD-10-CM | POA: Diagnosis not present

## 2018-02-07 DIAGNOSIS — R351 Nocturia: Secondary | ICD-10-CM | POA: Diagnosis not present

## 2018-02-07 DIAGNOSIS — R3915 Urgency of urination: Secondary | ICD-10-CM | POA: Diagnosis not present

## 2018-02-07 DIAGNOSIS — N3944 Nocturnal enuresis: Secondary | ICD-10-CM | POA: Diagnosis not present

## 2018-02-21 ENCOUNTER — Encounter: Payer: Self-pay | Admitting: Gastroenterology

## 2018-02-21 ENCOUNTER — Telehealth: Payer: Self-pay | Admitting: Gastroenterology

## 2018-02-21 ENCOUNTER — Ambulatory Visit (INDEPENDENT_AMBULATORY_CARE_PROVIDER_SITE_OTHER): Payer: Medicare Other | Admitting: Gastroenterology

## 2018-02-21 VITALS — BP 118/68 | HR 63 | Temp 98.1°F | Ht 62.0 in | Wt 216.0 lb

## 2018-02-21 DIAGNOSIS — Z1211 Encounter for screening for malignant neoplasm of colon: Secondary | ICD-10-CM

## 2018-02-21 DIAGNOSIS — K219 Gastro-esophageal reflux disease without esophagitis: Secondary | ICD-10-CM

## 2018-02-21 DIAGNOSIS — R110 Nausea: Secondary | ICD-10-CM | POA: Diagnosis not present

## 2018-02-21 DIAGNOSIS — R1013 Epigastric pain: Secondary | ICD-10-CM | POA: Diagnosis not present

## 2018-02-21 NOTE — Patient Instructions (Addendum)
1. Increase pantoprazole to one tablet twice a day (take 60 minutes before breakfast and 60 minutes before evening meal).   2. Please complete Cologuard testing. The kit will be mailed directly to the [facility]. Follow the directions and return specimen back to the [facility].   3. Please return to our office in three months.

## 2018-02-21 NOTE — Assessment & Plan Note (Signed)
Per patient request, we will go ahead and pursue Cologuard testing again.

## 2018-02-21 NOTE — Progress Notes (Signed)
Primary Care Physician: Audree Bane, DO  Primary Gastroenterologist:  Roetta Sessions, MD   Chief Complaint  Patient presents with  . Abdominal Pain  . Gastroesophageal Reflux  . Nausea    HPI: Gloria Chapman is a 62 y.o. female here for follow-up of GERD.  She was seen back in February with complaints of abdominal pain, nausea for more than a year as well as uncontrolled GERD.  Complained of intermittent dysphagia as well.  During our office visit we discussed screening colonoscopy but she was not interested.  She was interested in Cologuard testing stating that she would be willing to have a colonoscopy if it were positive.  We arrange for Cologuard testing but we were notified by the company that they never received the specimen.  When we try to investigate this we were unable to reconcile what happened.  Patient tells me today that she received a container to collect the specimen and requested help but "no one knew what was going on and they threw the container away".  Patient states her swallowing is better but she continues to have frequent heartburn.  She presents today without a medication list.  I noted that she was supposed to increase her pantoprazole to twice daily per instructions by Dr. Jena Gauss after endoscopy but patient remains on pantoprazole once daily according to the current MAR that we received during her office visit.  She also states she has some epigastric pain off and on when she lays down.  Some intermittent nausea but no vomiting.  Sometimes she takes Mylanta as needed.  Previous PPIs including Nexium which she seemed to like.  She does not recall any other ones.  Bowel movements are doing good right now.  No melena or rectal bleeding.  Current Outpatient Medications  Medication Sig Dispense Refill  . acetaminophen (TYLENOL) 325 MG tablet Take 650 mg by mouth every 6 (six) hours as needed for mild pain.    Marland Kitchen aspirin 325 MG tablet Take 325 mg by mouth daily.    .  AZO-CRANBERRY PO Take 2 tablets by mouth every 6 (six) hours as needed (urinary pain and burning).     . benzonatate (TESSALON) 100 MG capsule Take 100 mg by mouth 4 (four) times daily as needed for cough.    . Calcium Carbonate-Vitamin D3 (CALCIUM 600-D) 600-400 MG-UNIT TABS Take 1 tablet by mouth daily.    . DULoxetine (CYMBALTA) 60 MG capsule Take 60 mg by mouth daily.    . fluticasone furoate-vilanterol (BREO ELLIPTA) 100-25 MCG/INH AEPB Inhale 1 puff into the lungs daily.    . furosemide (LASIX) 40 MG tablet Take 40 mg by mouth daily.    Marland Kitchen gabapentin (NEURONTIN) 100 MG capsule Take 200 mg by mouth 3 (three) times daily. Give 300 mg and 200 mg to equal 500 mg three times daily    . gabapentin (NEURONTIN) 300 MG capsule Take 300 mg by mouth 3 (three) times daily. Give 300 mg and 200 mg to equal 500 mg three times daily    . HYDROmorphone (DILAUDID) 4 MG tablet Take 4 mg by mouth every 4 (four) hours as needed for severe pain.    . hydrOXYzine (ATARAX/VISTARIL) 25 MG tablet Take 25 mg by mouth 4 (four) times daily.    . insulin aspart (NOVOLOG FLEXPEN) 100 UNIT/ML FlexPen Inject into the skin 3 (three) times daily with meals. 2-10 units per sliding scale    . insulin glargine (LANTUS) 100 UNIT/ML injection  Inject 10 Units into the skin daily.    Marland Kitchen. levocetirizine (XYZAL) 5 MG tablet Take 5 mg by mouth every evening.    Marland Kitchen. LORazepam (ATIVAN) 0.5 MG tablet Take 0.5 mg by mouth daily.    Marland Kitchen. LORazepam (ATIVAN) 1 MG tablet Take 1 mg by mouth 2 (two) times daily.    . Melatonin 1 MG TABS Take 6 mg by mouth at bedtime.    . montelukast (SINGULAIR) 10 MG tablet Take 10 mg by mouth at bedtime.    . ondansetron (ZOFRAN) 4 MG tablet Take 4 mg by mouth every 12 (twelve) hours as needed for nausea or vomiting.    Marland Kitchen. oxybutynin (DITROPAN-XL) 5 MG 24 hr tablet Take 5 mg by mouth daily.     . pantoprazole (PROTONIX) 40 MG tablet Take 40 mg by mouth daily.     . potassium chloride (K-DUR,KLOR-CON) 10 MEQ tablet  Take 10 mEq by mouth daily.    Marland Kitchen. tiZANidine (ZANAFLEX) 2 MG tablet Take 2 mg by mouth every 8 (eight) hours as needed for muscle spasms.    . traZODone (DESYREL) 100 MG tablet Take 300 mg by mouth at bedtime.     . fluticasone (FLONASE) 50 MCG/ACT nasal spray Place 1 spray into both nostrils daily.      No current facility-administered medications for this visit.     Allergies as of 02/21/2018  . (No Known Allergies)    ROS:  General: Negative for anorexia, weight loss, fever, chills, fatigue, weakness. ENT: Negative for hoarseness, difficulty swallowing , nasal congestion. CV: Negative for chest pain, angina, palpitations, dyspnea on exertion, peripheral edema.  Respiratory: Negative for dyspnea at rest, dyspnea on exertion, cough, sputum, wheezing.  GI: See history of present illness. GU:  Negative for dysuria, hematuria, urinary incontinence, urinary frequency, nocturnal urination.  Endo: Negative for unusual weight change.    Physical Examination:   BP 118/68   Pulse 63   Temp 98.1 F (36.7 C) (Oral)   Ht 5\' 2"  (1.575 m)   Wt 216 lb (98 kg) Comment: stated by patient  BMI 39.51 kg/m   General: Well-nourished, well-developed in no acute distress.  Presents in a wheelchair. Eyes: No icterus. Mouth: Oropharyngeal mucosa moist and pink , no lesions erythema or exudate. Lungs: Clear to auscultation bilaterally.  Heart: Regular rate and rhythm, no murmurs rubs or gallops.  Abdomen: Bowel sounds are normal, nontender, nondistended, no hepatosplenomegaly or masses, no abdominal bruits or hernia , no rebound or guarding.  Exam limited as she had to be examined in the sitting position. Extremities: Trace lower extremity edema. No clubbing or deformities. Neuro: Alert and oriented x 4   Skin: Warm and dry, no jaundice.   Psych: Alert and cooperative, normal mood and affect.  Labs:  Lab Results  Component Value Date   CREATININE 0.75 11/15/2017   BUN 9 11/15/2017   NA 133 (L)  11/15/2017   K 3.9 11/15/2017   CL 92 (L) 11/15/2017   CO2 28 11/15/2017   Lab Results  Component Value Date   WBC 7.7 11/15/2017   HGB 15.3 (H) 11/15/2017   HCT 47.1 (H) 11/15/2017   MCV 91.5 11/15/2017   PLT 164 11/15/2017    Imaging Studies: No results found.

## 2018-02-21 NOTE — Telephone Encounter (Signed)
Please check with nursing home to verify that they have what they need to follow through with my order for increasing pantoprazole to 40mg  twice daily before breakfast and evening meal. SEE MY OV NOTE. DO THEY NEED RX?  Also she needs cologuard, make sure this is arranged and touch base with nursing staff so they will know why they are receiving it in the mail, PER PATIENT THEY THREW IT AWAY LAST TIME.

## 2018-02-21 NOTE — Assessment & Plan Note (Signed)
Ongoing nausea, poorly controlled heartburn however dysphagia is improved status post dilation.  Unfortunately she remains on pantoprazole 40 mg once daily as opposed to recommendation of twice daily.  We will make that change today.  Hopefully this will control her breakthrough symptoms.  We will see her back in 3 months to follow-up.

## 2018-02-24 ENCOUNTER — Other Ambulatory Visit: Payer: Self-pay

## 2018-02-24 MED ORDER — PANTOPRAZOLE SODIUM 40 MG PO TBEC: 40.0000 mg | DELAYED_RELEASE_TABLET | Freq: Two times a day (BID) | ORAL | 3 refills | Status: DC

## 2018-02-24 NOTE — Telephone Encounter (Signed)
[facility] and [facility] does [facility]. RX was faxed. They are aware that pt needs a cologuard. Cologuard is being sent to home by DS which was ordered 02/21/18 in my absence.

## 2018-02-24 NOTE — Progress Notes (Signed)
CC'D TO PCP °

## 2018-02-27 DIAGNOSIS — F5101 Primary insomnia: Secondary | ICD-10-CM | POA: Diagnosis not present

## 2018-02-27 DIAGNOSIS — F064 Anxiety disorder due to known physiological condition: Secondary | ICD-10-CM | POA: Diagnosis not present

## 2018-02-27 DIAGNOSIS — F331 Major depressive disorder, recurrent, moderate: Secondary | ICD-10-CM | POA: Diagnosis not present

## 2018-03-03 MED ORDER — PANTOPRAZOLE SODIUM 40 MG PO TBEC: DELAYED_RELEASE_TABLET | ORAL | 3 refills | Status: DC

## 2018-03-03 NOTE — Telephone Encounter (Signed)
Spoke to pts nurse and printed RX was faxed over.

## 2018-03-03 NOTE — Telephone Encounter (Signed)
Thank you :)

## 2018-03-03 NOTE — Addendum Note (Signed)
Addended by: Tiffany KocherLEWIS, Brandonlee Navis S on: 03/03/2018 09:46 AM   Modules accepted: Orders

## 2018-03-06 DIAGNOSIS — I509 Heart failure, unspecified: Secondary | ICD-10-CM | POA: Diagnosis not present

## 2018-03-06 DIAGNOSIS — J449 Chronic obstructive pulmonary disease, unspecified: Secondary | ICD-10-CM | POA: Diagnosis not present

## 2018-03-06 DIAGNOSIS — E119 Type 2 diabetes mellitus without complications: Secondary | ICD-10-CM | POA: Diagnosis not present

## 2018-03-06 DIAGNOSIS — I739 Peripheral vascular disease, unspecified: Secondary | ICD-10-CM | POA: Diagnosis not present

## 2018-03-06 DIAGNOSIS — K219 Gastro-esophageal reflux disease without esophagitis: Secondary | ICD-10-CM | POA: Diagnosis not present

## 2018-03-13 DIAGNOSIS — F331 Major depressive disorder, recurrent, moderate: Secondary | ICD-10-CM | POA: Diagnosis not present

## 2018-03-13 DIAGNOSIS — R609 Edema, unspecified: Secondary | ICD-10-CM | POA: Diagnosis not present

## 2018-03-13 DIAGNOSIS — G8929 Other chronic pain: Secondary | ICD-10-CM | POA: Diagnosis not present

## 2018-03-13 DIAGNOSIS — R52 Pain, unspecified: Secondary | ICD-10-CM | POA: Diagnosis not present

## 2018-03-13 DIAGNOSIS — R601 Generalized edema: Secondary | ICD-10-CM | POA: Diagnosis not present

## 2018-03-13 DIAGNOSIS — K219 Gastro-esophageal reflux disease without esophagitis: Secondary | ICD-10-CM | POA: Diagnosis not present

## 2018-03-13 DIAGNOSIS — F5101 Primary insomnia: Secondary | ICD-10-CM | POA: Diagnosis not present

## 2018-03-13 DIAGNOSIS — J4521 Mild intermittent asthma with (acute) exacerbation: Secondary | ICD-10-CM | POA: Diagnosis not present

## 2018-03-13 DIAGNOSIS — J441 Chronic obstructive pulmonary disease with (acute) exacerbation: Secondary | ICD-10-CM | POA: Diagnosis not present

## 2018-03-13 DIAGNOSIS — D649 Anemia, unspecified: Secondary | ICD-10-CM | POA: Diagnosis not present

## 2018-03-13 DIAGNOSIS — F17210 Nicotine dependence, cigarettes, uncomplicated: Secondary | ICD-10-CM | POA: Diagnosis not present

## 2018-03-13 DIAGNOSIS — M62838 Other muscle spasm: Secondary | ICD-10-CM | POA: Diagnosis not present

## 2018-03-17 ENCOUNTER — Encounter: Payer: Self-pay | Admitting: Podiatry

## 2018-03-17 ENCOUNTER — Ambulatory Visit (INDEPENDENT_AMBULATORY_CARE_PROVIDER_SITE_OTHER): Payer: Medicare Other | Admitting: Podiatry

## 2018-03-17 DIAGNOSIS — B351 Tinea unguium: Secondary | ICD-10-CM

## 2018-03-17 DIAGNOSIS — M79675 Pain in left toe(s): Secondary | ICD-10-CM | POA: Diagnosis not present

## 2018-03-17 DIAGNOSIS — M79674 Pain in right toe(s): Secondary | ICD-10-CM | POA: Diagnosis not present

## 2018-03-17 DIAGNOSIS — I10 Essential (primary) hypertension: Secondary | ICD-10-CM | POA: Insufficient documentation

## 2018-03-17 NOTE — Progress Notes (Signed)
This patient presents the office with chief complaint of long thick painful toenails on both feet. She presents the office today with the driver from the home.    Patient states the nails are painful walking and wearing her shoes.  She states she's having especially severe pain in her big toe, right foot.  She denies any history of trauma or injury.  Patient is in a wheelcheel and says she is unable to get out of the wheelchair for treatment.  Patient has history of diabetic neuropathy. She presents for preventative foot care services.  General Appearance  Alert, conversant and in no acute stress.  Vascular  Dorsalis pedis and posterior tibial  pulses are palpable  Left foot.  Dorsalis pedis and posterior tibial pulses and weakly palpable right foot..  Capillary return is within normal limits  bilaterally. Temperature is within normal limits  bilaterally.  Neurologic  Senn-Weinstein monofilament wire test within normal limits left foot.  LOPS right foot is diminished.. Muscle power within normal limits bilaterally.  Nails Thick disfigured discolored nails with subungual debris  from hallux to fifth toes bilaterally. No evidence of bacterial infection or drainage bilaterally.   Orthopedic  No limitations of motion of motion feet .  No crepitus or effusions noted.  No bony pathology or digital deformities noted.  Skin  normotropic skin with no porokeratosis noted bilaterally.  No signs of infections or ulcers noted.  There are multiple toes with distal keratosis subungually.  Onychonycosis  B/L    IE  Debride nails x 10.  During the treatment patient said that I cut the skin  on her right hallux.  This was the nail she stated was painful during everyday activity.  The patient also needed to stay in the wheelchair and said she could not raise her foot to be treated.  Therefore I might have cut the subungual callus despite seeing a dried hematoma indication previous injury.  Silver nitrate was use to  cauterize the bleeding under right hallux toenail.  Neosporin/DSD was applied. Patient to call the office as needed.  RTC 4 months   Helane GuntherGregory Brantley Naser DPM

## 2018-03-27 DIAGNOSIS — F064 Anxiety disorder due to known physiological condition: Secondary | ICD-10-CM | POA: Diagnosis not present

## 2018-03-27 DIAGNOSIS — F331 Major depressive disorder, recurrent, moderate: Secondary | ICD-10-CM | POA: Diagnosis not present

## 2018-03-27 DIAGNOSIS — L03031 Cellulitis of right toe: Secondary | ICD-10-CM | POA: Diagnosis not present

## 2018-03-27 DIAGNOSIS — F17210 Nicotine dependence, cigarettes, uncomplicated: Secondary | ICD-10-CM | POA: Diagnosis not present

## 2018-03-27 DIAGNOSIS — F5101 Primary insomnia: Secondary | ICD-10-CM | POA: Diagnosis not present

## 2018-04-07 DIAGNOSIS — D649 Anemia, unspecified: Secondary | ICD-10-CM | POA: Diagnosis not present

## 2018-04-07 DIAGNOSIS — Z79899 Other long term (current) drug therapy: Secondary | ICD-10-CM | POA: Diagnosis not present

## 2018-04-07 DIAGNOSIS — D509 Iron deficiency anemia, unspecified: Secondary | ICD-10-CM | POA: Diagnosis not present

## 2018-04-09 DIAGNOSIS — I509 Heart failure, unspecified: Secondary | ICD-10-CM | POA: Diagnosis not present

## 2018-04-09 DIAGNOSIS — I739 Peripheral vascular disease, unspecified: Secondary | ICD-10-CM | POA: Diagnosis not present

## 2018-04-09 DIAGNOSIS — N3281 Overactive bladder: Secondary | ICD-10-CM | POA: Diagnosis not present

## 2018-04-09 DIAGNOSIS — K219 Gastro-esophageal reflux disease without esophagitis: Secondary | ICD-10-CM | POA: Diagnosis not present

## 2018-04-09 DIAGNOSIS — G894 Chronic pain syndrome: Secondary | ICD-10-CM | POA: Diagnosis not present

## 2018-04-09 DIAGNOSIS — J449 Chronic obstructive pulmonary disease, unspecified: Secondary | ICD-10-CM | POA: Diagnosis not present

## 2018-04-09 DIAGNOSIS — L03115 Cellulitis of right lower limb: Secondary | ICD-10-CM | POA: Diagnosis not present

## 2018-04-09 DIAGNOSIS — E119 Type 2 diabetes mellitus without complications: Secondary | ICD-10-CM | POA: Diagnosis not present

## 2018-04-11 DIAGNOSIS — G8929 Other chronic pain: Secondary | ICD-10-CM | POA: Diagnosis not present

## 2018-04-11 DIAGNOSIS — F5101 Primary insomnia: Secondary | ICD-10-CM | POA: Diagnosis not present

## 2018-04-11 DIAGNOSIS — J4521 Mild intermittent asthma with (acute) exacerbation: Secondary | ICD-10-CM | POA: Diagnosis not present

## 2018-04-11 DIAGNOSIS — M62838 Other muscle spasm: Secondary | ICD-10-CM | POA: Diagnosis not present

## 2018-04-11 DIAGNOSIS — D649 Anemia, unspecified: Secondary | ICD-10-CM | POA: Diagnosis not present

## 2018-04-11 DIAGNOSIS — J441 Chronic obstructive pulmonary disease with (acute) exacerbation: Secondary | ICD-10-CM | POA: Diagnosis not present

## 2018-04-11 DIAGNOSIS — F17210 Nicotine dependence, cigarettes, uncomplicated: Secondary | ICD-10-CM | POA: Diagnosis not present

## 2018-04-11 DIAGNOSIS — K219 Gastro-esophageal reflux disease without esophagitis: Secondary | ICD-10-CM | POA: Diagnosis not present

## 2018-04-11 DIAGNOSIS — F064 Anxiety disorder due to known physiological condition: Secondary | ICD-10-CM | POA: Diagnosis not present

## 2018-04-11 DIAGNOSIS — F331 Major depressive disorder, recurrent, moderate: Secondary | ICD-10-CM | POA: Diagnosis not present

## 2018-04-11 DIAGNOSIS — R52 Pain, unspecified: Secondary | ICD-10-CM | POA: Diagnosis not present

## 2018-04-11 DIAGNOSIS — R601 Generalized edema: Secondary | ICD-10-CM | POA: Diagnosis not present

## 2018-04-24 DIAGNOSIS — F17210 Nicotine dependence, cigarettes, uncomplicated: Secondary | ICD-10-CM | POA: Diagnosis not present

## 2018-04-24 DIAGNOSIS — F5101 Primary insomnia: Secondary | ICD-10-CM | POA: Diagnosis not present

## 2018-04-24 DIAGNOSIS — F064 Anxiety disorder due to known physiological condition: Secondary | ICD-10-CM | POA: Diagnosis not present

## 2018-04-24 DIAGNOSIS — F331 Major depressive disorder, recurrent, moderate: Secondary | ICD-10-CM | POA: Diagnosis not present

## 2018-04-28 ENCOUNTER — Telehealth: Payer: Self-pay

## 2018-04-28 NOTE — Telephone Encounter (Signed)
Donna called and said the kit was not received. 

## 2018-04-28 NOTE — Telephone Encounter (Signed)
I called and was on HOLD for over 10 min. I hung up and called back and left message with April for a return call.

## 2018-04-28 NOTE — Telephone Encounter (Signed)
VM received this morning at 8:03 AM. Lupita Leashonna called to ask if the cologuard test was sent to pt. They haven't received it and want to complete it before pts next apt.  [phone] [facility]

## 2018-04-28 NOTE — Telephone Encounter (Signed)
I called and spoke to Dillon BeachRobert again at [employer]. He said he will need a copy of pt's new medicare care that has letters and numbers. [facility] called [facility] and spoke to [place]lake[place]na who will fax that to me.

## 2018-04-28 NOTE — Telephone Encounter (Addendum)
I called [employer] @ [phone] and spoke to [place]. They mailed the kit to pt on 02/27/2018 to [place] [place]. [id number].  I will call the [facility] and speak with Butch Penny.

## 2018-05-01 NOTE — Telephone Encounter (Signed)
I called and spoke to Lupita LeashDonna and she will fax the insurance cards so I can reorder the Cologuard for pt.

## 2018-05-02 NOTE — Telephone Encounter (Signed)
I have called the [employer] [facility] and spoke to [place] who has reordered the kit. She said pt should receive it within 3-7 days.

## 2018-05-02 NOTE — Telephone Encounter (Signed)
I still have not received the insurance info on pt. I called and spoke to LivengoodJessica in administration. She is faxing me the info that she has. Medicaid #  [id number] T Medicare # [id number]

## 2018-05-15 DIAGNOSIS — F5101 Primary insomnia: Secondary | ICD-10-CM | POA: Diagnosis not present

## 2018-05-15 DIAGNOSIS — J441 Chronic obstructive pulmonary disease with (acute) exacerbation: Secondary | ICD-10-CM | POA: Diagnosis not present

## 2018-05-15 DIAGNOSIS — F17210 Nicotine dependence, cigarettes, uncomplicated: Secondary | ICD-10-CM | POA: Diagnosis not present

## 2018-05-15 DIAGNOSIS — D649 Anemia, unspecified: Secondary | ICD-10-CM | POA: Diagnosis not present

## 2018-05-15 DIAGNOSIS — J4521 Mild intermittent asthma with (acute) exacerbation: Secondary | ICD-10-CM | POA: Diagnosis not present

## 2018-05-15 DIAGNOSIS — F331 Major depressive disorder, recurrent, moderate: Secondary | ICD-10-CM | POA: Diagnosis not present

## 2018-05-15 DIAGNOSIS — F064 Anxiety disorder due to known physiological condition: Secondary | ICD-10-CM | POA: Diagnosis not present

## 2018-05-15 DIAGNOSIS — R601 Generalized edema: Secondary | ICD-10-CM | POA: Diagnosis not present

## 2018-05-15 DIAGNOSIS — M62838 Other muscle spasm: Secondary | ICD-10-CM | POA: Diagnosis not present

## 2018-05-15 DIAGNOSIS — G8929 Other chronic pain: Secondary | ICD-10-CM | POA: Diagnosis not present

## 2018-05-15 DIAGNOSIS — K219 Gastro-esophageal reflux disease without esophagitis: Secondary | ICD-10-CM | POA: Diagnosis not present

## 2018-05-15 DIAGNOSIS — R52 Pain, unspecified: Secondary | ICD-10-CM | POA: Diagnosis not present

## 2018-05-20 DIAGNOSIS — R51 Headache: Secondary | ICD-10-CM | POA: Diagnosis not present

## 2018-05-27 ENCOUNTER — Ambulatory Visit: Payer: Medicare Other | Admitting: Internal Medicine

## 2018-05-29 DIAGNOSIS — F331 Major depressive disorder, recurrent, moderate: Secondary | ICD-10-CM | POA: Diagnosis not present

## 2018-05-29 DIAGNOSIS — F5101 Primary insomnia: Secondary | ICD-10-CM | POA: Diagnosis not present

## 2018-05-29 DIAGNOSIS — F064 Anxiety disorder due to known physiological condition: Secondary | ICD-10-CM | POA: Diagnosis not present

## 2018-06-10 DIAGNOSIS — L853 Xerosis cutis: Secondary | ICD-10-CM | POA: Diagnosis not present

## 2018-06-10 DIAGNOSIS — Q845 Enlarged and hypertrophic nails: Secondary | ICD-10-CM | POA: Diagnosis not present

## 2018-06-10 DIAGNOSIS — I739 Peripheral vascular disease, unspecified: Secondary | ICD-10-CM | POA: Diagnosis not present

## 2018-06-10 DIAGNOSIS — L603 Nail dystrophy: Secondary | ICD-10-CM | POA: Diagnosis not present

## 2018-06-10 DIAGNOSIS — B351 Tinea unguium: Secondary | ICD-10-CM | POA: Diagnosis not present

## 2018-06-10 DIAGNOSIS — M2010 Hallux valgus (acquired), unspecified foot: Secondary | ICD-10-CM | POA: Diagnosis not present

## 2018-06-17 ENCOUNTER — Ambulatory Visit: Payer: Medicare Other | Admitting: Internal Medicine

## 2018-06-20 DIAGNOSIS — J3489 Other specified disorders of nose and nasal sinuses: Secondary | ICD-10-CM | POA: Diagnosis not present

## 2018-06-20 DIAGNOSIS — M26622 Arthralgia of left temporomandibular joint: Secondary | ICD-10-CM | POA: Diagnosis not present

## 2018-06-20 DIAGNOSIS — H6983 Other specified disorders of Eustachian tube, bilateral: Secondary | ICD-10-CM | POA: Diagnosis not present

## 2018-07-11 DIAGNOSIS — Z79899 Other long term (current) drug therapy: Secondary | ICD-10-CM | POA: Diagnosis not present

## 2018-07-11 DIAGNOSIS — D649 Anemia, unspecified: Secondary | ICD-10-CM | POA: Diagnosis not present

## 2018-07-15 ENCOUNTER — Telehealth: Payer: Self-pay | Admitting: Internal Medicine

## 2018-07-15 ENCOUNTER — Ambulatory Visit: Payer: Medicare Other | Admitting: Internal Medicine

## 2018-07-15 ENCOUNTER — Encounter: Payer: Self-pay | Admitting: Internal Medicine

## 2018-07-15 NOTE — Telephone Encounter (Signed)
PATIENT WAS A NO SHOW AND LETTER SENT  °

## 2018-07-17 DIAGNOSIS — F321 Major depressive disorder, single episode, moderate: Secondary | ICD-10-CM | POA: Diagnosis not present

## 2018-07-17 DIAGNOSIS — J4521 Mild intermittent asthma with (acute) exacerbation: Secondary | ICD-10-CM | POA: Diagnosis not present

## 2018-07-17 DIAGNOSIS — F419 Anxiety disorder, unspecified: Secondary | ICD-10-CM | POA: Diagnosis not present

## 2018-07-17 DIAGNOSIS — F331 Major depressive disorder, recurrent, moderate: Secondary | ICD-10-CM | POA: Diagnosis not present

## 2018-07-17 DIAGNOSIS — F064 Anxiety disorder due to known physiological condition: Secondary | ICD-10-CM | POA: Diagnosis not present

## 2018-07-17 DIAGNOSIS — G8929 Other chronic pain: Secondary | ICD-10-CM | POA: Diagnosis not present

## 2018-07-18 DIAGNOSIS — F419 Anxiety disorder, unspecified: Secondary | ICD-10-CM | POA: Diagnosis not present

## 2018-07-18 DIAGNOSIS — F5101 Primary insomnia: Secondary | ICD-10-CM | POA: Diagnosis not present

## 2018-07-18 DIAGNOSIS — F331 Major depressive disorder, recurrent, moderate: Secondary | ICD-10-CM | POA: Diagnosis not present

## 2018-07-18 DIAGNOSIS — F17210 Nicotine dependence, cigarettes, uncomplicated: Secondary | ICD-10-CM | POA: Diagnosis not present

## 2018-07-21 DIAGNOSIS — N3281 Overactive bladder: Secondary | ICD-10-CM | POA: Diagnosis not present

## 2018-07-21 DIAGNOSIS — L853 Xerosis cutis: Secondary | ICD-10-CM | POA: Diagnosis not present

## 2018-07-21 DIAGNOSIS — F3289 Other specified depressive episodes: Secondary | ICD-10-CM | POA: Diagnosis not present

## 2018-07-21 DIAGNOSIS — J449 Chronic obstructive pulmonary disease, unspecified: Secondary | ICD-10-CM | POA: Diagnosis not present

## 2018-07-21 DIAGNOSIS — K219 Gastro-esophageal reflux disease without esophagitis: Secondary | ICD-10-CM | POA: Diagnosis not present

## 2018-07-21 DIAGNOSIS — I509 Heart failure, unspecified: Secondary | ICD-10-CM | POA: Diagnosis not present

## 2018-07-21 DIAGNOSIS — K580 Irritable bowel syndrome with diarrhea: Secondary | ICD-10-CM | POA: Diagnosis not present

## 2018-07-21 DIAGNOSIS — I739 Peripheral vascular disease, unspecified: Secondary | ICD-10-CM | POA: Diagnosis not present

## 2018-07-21 DIAGNOSIS — J302 Other seasonal allergic rhinitis: Secondary | ICD-10-CM | POA: Diagnosis not present

## 2018-07-21 DIAGNOSIS — R54 Age-related physical debility: Secondary | ICD-10-CM | POA: Diagnosis not present

## 2018-07-21 DIAGNOSIS — G894 Chronic pain syndrome: Secondary | ICD-10-CM | POA: Diagnosis not present

## 2018-07-21 DIAGNOSIS — E119 Type 2 diabetes mellitus without complications: Secondary | ICD-10-CM | POA: Diagnosis not present

## 2018-07-23 DIAGNOSIS — L259 Unspecified contact dermatitis, unspecified cause: Secondary | ICD-10-CM | POA: Diagnosis not present

## 2018-07-24 DIAGNOSIS — E119 Type 2 diabetes mellitus without complications: Secondary | ICD-10-CM | POA: Diagnosis not present

## 2018-07-29 DIAGNOSIS — Z79899 Other long term (current) drug therapy: Secondary | ICD-10-CM | POA: Diagnosis not present

## 2018-07-29 DIAGNOSIS — E559 Vitamin D deficiency, unspecified: Secondary | ICD-10-CM | POA: Diagnosis not present

## 2018-07-29 DIAGNOSIS — E119 Type 2 diabetes mellitus without complications: Secondary | ICD-10-CM | POA: Diagnosis not present

## 2018-07-29 DIAGNOSIS — E039 Hypothyroidism, unspecified: Secondary | ICD-10-CM | POA: Diagnosis not present

## 2018-07-29 DIAGNOSIS — D649 Anemia, unspecified: Secondary | ICD-10-CM | POA: Diagnosis not present

## 2018-07-31 DIAGNOSIS — F331 Major depressive disorder, recurrent, moderate: Secondary | ICD-10-CM | POA: Diagnosis not present

## 2018-07-31 DIAGNOSIS — F5101 Primary insomnia: Secondary | ICD-10-CM | POA: Diagnosis not present

## 2018-07-31 DIAGNOSIS — F064 Anxiety disorder due to known physiological condition: Secondary | ICD-10-CM | POA: Diagnosis not present

## 2018-08-07 DIAGNOSIS — M818 Other osteoporosis without current pathological fracture: Secondary | ICD-10-CM | POA: Diagnosis not present

## 2018-08-07 DIAGNOSIS — N3281 Overactive bladder: Secondary | ICD-10-CM | POA: Diagnosis not present

## 2018-08-07 DIAGNOSIS — G894 Chronic pain syndrome: Secondary | ICD-10-CM | POA: Diagnosis not present

## 2018-08-07 DIAGNOSIS — R54 Age-related physical debility: Secondary | ICD-10-CM | POA: Diagnosis not present

## 2018-08-07 DIAGNOSIS — L853 Xerosis cutis: Secondary | ICD-10-CM | POA: Diagnosis not present

## 2018-08-07 DIAGNOSIS — K580 Irritable bowel syndrome with diarrhea: Secondary | ICD-10-CM | POA: Diagnosis not present

## 2018-08-07 DIAGNOSIS — I739 Peripheral vascular disease, unspecified: Secondary | ICD-10-CM | POA: Diagnosis not present

## 2018-08-07 DIAGNOSIS — I509 Heart failure, unspecified: Secondary | ICD-10-CM | POA: Diagnosis not present

## 2018-08-07 DIAGNOSIS — E119 Type 2 diabetes mellitus without complications: Secondary | ICD-10-CM | POA: Diagnosis not present

## 2018-08-07 DIAGNOSIS — J449 Chronic obstructive pulmonary disease, unspecified: Secondary | ICD-10-CM | POA: Diagnosis not present

## 2018-08-07 DIAGNOSIS — K219 Gastro-esophageal reflux disease without esophagitis: Secondary | ICD-10-CM | POA: Diagnosis not present

## 2018-08-07 DIAGNOSIS — J302 Other seasonal allergic rhinitis: Secondary | ICD-10-CM | POA: Diagnosis not present

## 2018-08-08 DIAGNOSIS — F5101 Primary insomnia: Secondary | ICD-10-CM | POA: Diagnosis not present

## 2018-08-08 DIAGNOSIS — F331 Major depressive disorder, recurrent, moderate: Secondary | ICD-10-CM | POA: Diagnosis not present

## 2018-08-08 DIAGNOSIS — F17210 Nicotine dependence, cigarettes, uncomplicated: Secondary | ICD-10-CM | POA: Diagnosis not present

## 2018-08-08 DIAGNOSIS — F419 Anxiety disorder, unspecified: Secondary | ICD-10-CM | POA: Diagnosis not present

## 2018-08-15 DIAGNOSIS — D649 Anemia, unspecified: Secondary | ICD-10-CM | POA: Diagnosis not present

## 2018-08-15 DIAGNOSIS — R0602 Shortness of breath: Secondary | ICD-10-CM | POA: Diagnosis not present

## 2018-08-15 DIAGNOSIS — R05 Cough: Secondary | ICD-10-CM | POA: Diagnosis not present

## 2018-08-15 DIAGNOSIS — J441 Chronic obstructive pulmonary disease with (acute) exacerbation: Secondary | ICD-10-CM | POA: Diagnosis not present

## 2018-08-18 DIAGNOSIS — L03115 Cellulitis of right lower limb: Secondary | ICD-10-CM | POA: Diagnosis not present

## 2018-08-18 DIAGNOSIS — R05 Cough: Secondary | ICD-10-CM | POA: Diagnosis not present

## 2018-08-18 DIAGNOSIS — J449 Chronic obstructive pulmonary disease, unspecified: Secondary | ICD-10-CM | POA: Diagnosis not present

## 2018-08-21 ENCOUNTER — Encounter: Payer: Self-pay | Admitting: Internal Medicine

## 2018-09-01 DIAGNOSIS — Q845 Enlarged and hypertrophic nails: Secondary | ICD-10-CM | POA: Diagnosis not present

## 2018-09-01 DIAGNOSIS — I739 Peripheral vascular disease, unspecified: Secondary | ICD-10-CM | POA: Diagnosis not present

## 2018-09-01 DIAGNOSIS — L603 Nail dystrophy: Secondary | ICD-10-CM | POA: Diagnosis not present

## 2018-09-01 DIAGNOSIS — B351 Tinea unguium: Secondary | ICD-10-CM | POA: Diagnosis not present

## 2018-09-02 DIAGNOSIS — H2513 Age-related nuclear cataract, bilateral: Secondary | ICD-10-CM | POA: Diagnosis not present

## 2018-09-02 DIAGNOSIS — Z794 Long term (current) use of insulin: Secondary | ICD-10-CM | POA: Diagnosis not present

## 2018-09-02 DIAGNOSIS — Z7984 Long term (current) use of oral hypoglycemic drugs: Secondary | ICD-10-CM | POA: Diagnosis not present

## 2018-09-02 DIAGNOSIS — E119 Type 2 diabetes mellitus without complications: Secondary | ICD-10-CM | POA: Diagnosis not present

## 2018-09-16 DIAGNOSIS — J4521 Mild intermittent asthma with (acute) exacerbation: Secondary | ICD-10-CM | POA: Diagnosis not present

## 2018-09-16 DIAGNOSIS — F064 Anxiety disorder due to known physiological condition: Secondary | ICD-10-CM | POA: Diagnosis not present

## 2018-09-16 DIAGNOSIS — G8929 Other chronic pain: Secondary | ICD-10-CM | POA: Diagnosis not present

## 2018-09-16 DIAGNOSIS — F331 Major depressive disorder, recurrent, moderate: Secondary | ICD-10-CM | POA: Diagnosis not present

## 2018-09-16 DIAGNOSIS — F321 Major depressive disorder, single episode, moderate: Secondary | ICD-10-CM | POA: Diagnosis not present

## 2018-09-16 DIAGNOSIS — F419 Anxiety disorder, unspecified: Secondary | ICD-10-CM | POA: Diagnosis not present

## 2018-09-16 DIAGNOSIS — F5105 Insomnia due to other mental disorder: Secondary | ICD-10-CM | POA: Diagnosis not present

## 2018-09-23 ENCOUNTER — Ambulatory Visit: Payer: Medicare Other | Admitting: Internal Medicine

## 2018-10-03 DIAGNOSIS — Z0000 Encounter for general adult medical examination without abnormal findings: Secondary | ICD-10-CM | POA: Diagnosis not present

## 2018-10-03 DIAGNOSIS — I739 Peripheral vascular disease, unspecified: Secondary | ICD-10-CM | POA: Diagnosis not present

## 2018-10-04 DIAGNOSIS — Z79899 Other long term (current) drug therapy: Secondary | ICD-10-CM | POA: Diagnosis not present

## 2018-10-04 DIAGNOSIS — R319 Hematuria, unspecified: Secondary | ICD-10-CM | POA: Diagnosis not present

## 2018-10-04 DIAGNOSIS — N390 Urinary tract infection, site not specified: Secondary | ICD-10-CM | POA: Diagnosis not present

## 2018-10-06 DIAGNOSIS — J441 Chronic obstructive pulmonary disease with (acute) exacerbation: Secondary | ICD-10-CM | POA: Diagnosis not present

## 2018-10-06 DIAGNOSIS — Z79899 Other long term (current) drug therapy: Secondary | ICD-10-CM | POA: Diagnosis not present

## 2018-10-09 DIAGNOSIS — B1920 Unspecified viral hepatitis C without hepatic coma: Secondary | ICD-10-CM | POA: Diagnosis not present

## 2018-10-10 ENCOUNTER — Encounter: Payer: Self-pay | Admitting: Internal Medicine

## 2018-10-10 ENCOUNTER — Ambulatory Visit (INDEPENDENT_AMBULATORY_CARE_PROVIDER_SITE_OTHER): Payer: Medicare Other | Admitting: Internal Medicine

## 2018-10-10 ENCOUNTER — Ambulatory Visit: Payer: Medicare Other | Admitting: Internal Medicine

## 2018-10-10 ENCOUNTER — Encounter: Payer: Self-pay | Admitting: Gastroenterology

## 2018-10-10 VITALS — BP 111/77 | HR 76 | Temp 97.7°F | Ht 62.0 in | Wt 223.0 lb

## 2018-10-10 DIAGNOSIS — F419 Anxiety disorder, unspecified: Secondary | ICD-10-CM | POA: Diagnosis not present

## 2018-10-10 DIAGNOSIS — K5909 Other constipation: Secondary | ICD-10-CM

## 2018-10-10 DIAGNOSIS — K2100 Gastro-esophageal reflux disease with esophagitis, without bleeding: Secondary | ICD-10-CM

## 2018-10-10 DIAGNOSIS — F331 Major depressive disorder, recurrent, moderate: Secondary | ICD-10-CM | POA: Diagnosis not present

## 2018-10-10 DIAGNOSIS — F17210 Nicotine dependence, cigarettes, uncomplicated: Secondary | ICD-10-CM | POA: Diagnosis not present

## 2018-10-10 DIAGNOSIS — D509 Iron deficiency anemia, unspecified: Secondary | ICD-10-CM | POA: Diagnosis not present

## 2018-10-10 DIAGNOSIS — R142 Eructation: Secondary | ICD-10-CM

## 2018-10-10 DIAGNOSIS — F5101 Primary insomnia: Secondary | ICD-10-CM | POA: Diagnosis not present

## 2018-10-10 NOTE — Telephone Encounter (Signed)
Pt was seen in office today. Called to check on Cologuard test results ([employer] [phone]). I spoke with Erie NoeVanessa and she confirmed that the cologuard received in 05/2018 didn't have enough DNA in it to have a positive or negative results. They asked pt to repeat the cologuard. Erie NoeVanessa wasn't sure if the new test was lost when received or if they received it. She will call back with that information.   Pt signed a new cologuard order form in office today just in case we need to fax a new order form .

## 2018-10-10 NOTE — Patient Instructions (Addendum)
Continue protonix 40 mg twice daily  Take Miralax 17 grams orally every other night at bedtime  Solid phase GES to evaluate nausea and belching in a diabetic on chronic narcotic therapy  Need result of cologard testing - we are looking for it.-Looks like we will need to submit another stool sample for Cologuard testing.  Further recommendations to follow  We will plan to see him back in 3 months but will be in touch with the above testing results are available for review.

## 2018-10-10 NOTE — Progress Notes (Addendum)
Primary Care Physician:  Audree BaneKing, Peter, DO Primary Gastroenterologist:  Dr. Janalyn Rouseourk fc   Pre-Procedure History & Physical: HPI:  Gloria Chapman is a 63 y.o. female nursing home resident here for follow-up of nausea, vomiting GERD dysphagia.  Has been on Protonix twice daily since her last visit.  Reflux symptoms better.  Belches all the time.  Brings up sour material on a regular basis.  Not much in the way of frank vomiting.  Minimal abdominal pain although she had has some epigastric discomfort from time to time.  Gallbladder in situ.  No prior imaging.  On chronic narcotics including Dilaudid and MS Contin.  No hemoglobin A1c or abdominal ultrasound in the coming record.  Blood sugars running in the 2-300 range.  EGD last year demonstrated Schatzki's ring and reflux esophagitis-status post Maloney dilation. Patient has a bowel movement 1-2 times weekly.  Does not take MiraLAX on a regular basis. She has refused a colonoscopy.  Was agreeable to DNA testing.  The first assay got found up and no sample was sent.  Apparently another sample was sent but were learning now that specimen may have been an adequate(we are gathering more information).  Past Medical History:  Diagnosis Date  . Anemia   . Anxiety   . Arthritis   . COPD (chronic obstructive pulmonary disease) (HCC)   . Depression   . Dyspnea   . Fibromyalgia   . GERD (gastroesophageal reflux disease)   . Headache   . Heart failure (HCC)   . Heart murmur   . Hepatitis   . History of Clostridium difficile infection   . History of methicillin resistant staphylococcus aureus (MRSA)   . Insomnia   . Overactive bladder   . Pneumonia     Past Surgical History:  Procedure Laterality Date  . APPENDECTOMY    . BACK SURGERY     twice  . DIAGNOSTIC LAPAROSCOPY     checking for endometriosis  . ESOPHAGOGASTRODUODENOSCOPY (EGD) WITH PROPOFOL N/A 11/21/2017   Dr. Jena Gaussourk: Erosive reflux esophagitis, low-grade narrowing Schatzki ring status  post dilation, small hiatal hernia.  Marland Kitchen. FEMUR FRACTURE SURGERY Right    Rods placed  . HEMORRHOID SURGERY    . INNER EAR SURGERY Left   . MALONEY DILATION N/A 11/21/2017   Procedure: Gloria HashimotoMALONEY DILATION;  Surgeon: Corbin Adeourk, Tania Perrott M, MD;  Location: [facility];  Service: Endoscopy;  Laterality: N/A;  . NASAL SINUS SURGERY    . right hip surgery     [place]: "took my hip out due to MRSA" occurred after hip replacements    Prior to Admission medications   Medication Sig Start Date End Date Taking? Authorizing Provider  acetaminophen (TYLENOL) 325 MG tablet Take 650 mg by mouth every 6 (six) hours as needed for mild pain.   Yes [provider]  aspirin 325 MG tablet Take 325 mg by mouth daily.   Yes [provider]  AZO-CRANBERRY PO Take 2 tablets by mouth every 6 (six) hours as needed (urinary pain and burning).    Yes [provider]  Calcium Carbonate-Vitamin D3 (CALCIUM 600-D) 600-400 MG-UNIT TABS Take 1 tablet by mouth daily.   Yes [provider]  diphenhydrAMINE (BENADRYL) 25 mg capsule Take 25 mg by mouth every 6 (six) hours as needed.    Yes [provider]  DULoxetine (CYMBALTA) 60 MG capsule Take 60 mg by mouth daily.   Yes [provider]  fluticasone (FLONASE) 50 MCG/ACT nasal spray Place 1  spray into both nostrils daily.   Yes [provider]  fluticasone furoate-vilanterol (BREO ELLIPTA) 100-25 MCG/INH AEPB Inhale 1 puff into the lungs daily.   Yes [provider]  furosemide (LASIX) 40 MG tablet Take 40 mg by mouth daily.   Yes [provider]  gabapentin (NEURONTIN) 100 MG capsule Take 200 mg by mouth 3 (three) times daily. Give 300 mg and 200 mg to equal 500 mg three times daily 10/12/17  Yes [provider]  gabapentin (NEURONTIN) 300 MG capsule Take 300 mg by mouth 3 (three) times daily. Give 300 mg and 200 mg to equal 500 mg three times daily 10/05/17  Yes [provider]  GLUCAGON HCL  IJ Inject as directed.   Yes [provider]  HYDROmorphone (DILAUDID) 4 MG tablet Take 4 mg by mouth every 4 (four) hours as needed for severe pain.   Yes [provider]  insulin aspart (NOVOLOG FLEXPEN) 100 UNIT/ML FlexPen Inject into the skin 3 (three) times daily with meals. 2-10 units per sliding scale   Yes [provider]  insulin glargine (LANTUS) 100 UNIT/ML injection Inject 20 Units into the skin daily.    Yes [provider]  levocetirizine (XYZAL) 5 MG tablet Take 5 mg by mouth every evening.   Yes [provider]  LORazepam (ATIVAN) 1 MG tablet Take 1 mg by mouth 3 (three) times daily.    Yes [provider]  Melatonin 1 MG TABS Take 6 mg by mouth at bedtime.   Yes [provider]  morphine (MS CONTIN) 30 MG 12 hr tablet Take 30 mg by mouth every 8 (eight) hours as needed.  02/18/18  Yes [provider]  ondansetron (ZOFRAN) 4 MG tablet Take 4 mg by mouth every 12 (twelve) hours as needed for nausea or vomiting.   Yes [provider]  oxybutynin (DITROPAN-XL) 5 MG 24 hr tablet Take 5 mg by mouth daily.    Yes [provider]  pantoprazole (PROTONIX) 40 MG tablet Take 40 mg by mouth 2 (two) times daily.    Yes [provider]  polyethylene glycol (MIRALAX / GLYCOLAX) packet Take 17 g by mouth daily as needed.   Yes [provider]  potassium chloride (KLOR-CON SPRINKLE) 10 MEQ CR capsule Take 10 mEq by mouth daily.  03/05/16  Yes [provider]  sitaGLIPtin (JANUVIA) 50 MG tablet Take 50 mg by mouth daily.   Yes [provider]  traZODone (DESYREL) 100 MG tablet Take 300 mg by mouth at bedtime.    Yes [provider]  benzonatate (TESSALON) 100 MG capsule Take 100 mg by mouth 4 (four) times daily as needed for cough.    [provider]  hydrOXYzine (ATARAX/VISTARIL) 25 MG tablet Take 25 mg by mouth 4 (four) times daily.    [provider]    ibuprofen (ADVIL,MOTRIN) 800 MG tablet Take by mouth. 03/21/12   [provider]  ipratropium-albuterol (DUONEB) 0.5-2.5 (3) MG/3ML SOLN  12/13/17   [provider]  loperamide (IMODIUM) 2 MG capsule Take by mouth.    [provider]  LORazepam (ATIVAN) 0.5 MG tablet Take 0.5 mg by mouth daily.    [provider]  montelukast (SINGULAIR) 10 MG tablet Take 10 mg by mouth at bedtime.    [provider]  nicotine (NICODERM CQ - DOSED IN MG/24 HOURS) 21 mg/24hr patch  05/25/11   [provider]  nystatin ointment (MYCOSTATIN)  03/01/16  [provider]  promethazine (PHENERGAN) 25 MG tablet Take by mouth. 06/02/09   [provider]  tiZANidine (ZANAFLEX) 2 MG tablet Take 2 mg by mouth every 8 (eight) hours as needed for muscle spasms.    [provider]    Allergies as of 10/10/2018  . (No Known Allergies)    Family History  Problem Relation Age of Onset  . Cancer Father        unknown  . Colon cancer Neg Hx     Social History   Socioeconomic History  . Marital status: Divorced    Spouse name: Not on file  . Number of children: Not on file  . Years of education: Not on file  . Highest education level: Not on file  Occupational History  . Not on file  Social Needs  . Financial resource strain: Not on file  . Food insecurity:    Worry: Not on file    Inability: Not on file  . Transportation needs:    Medical: Not on file    Non-medical: Not on file  Tobacco Use  . Smoking status: Current Every Day Smoker    Packs/day: 0.50    Types: Cigarettes  . Smokeless tobacco: Never Used  Substance and Sexual Activity  . Alcohol use: No    Frequency: Never  . Drug use: No  . Sexual activity: Not on file  Lifestyle  . Physical activity:    Days per week: Not on file    Minutes per session: Not on file  . Stress: Not on file  Relationships  . Social connections:    Talks on phone: Not on file    Gets  together: Not on file    Attends religious service: Not on file    Active member of club or organization: Not on file    Attends meetings of clubs or organizations: Not on file    Relationship status: Not on file  . Intimate partner violence:    Fear of current or ex partner: Not on file    Emotionally abused: Not on file    Physically abused: Not on file    Forced sexual activity: Not on file  Other Topics Concern  . Not on file  Social History Narrative  . Not on file    Review of Systems: See HPI, otherwise negative ROS  Physical Exam: BP 111/77   Pulse 76   Temp 97.7 F (36.5 C) (Oral)   Ht 5\' 2"  (1.575 m)   Wt 223 lb (101.2 kg)   BMI 40.79 kg/m  General:   Debilitated [age] year old lady bound to the wheelchair who appears much older than her stated chronological age.  She is alert and conversant appears to be in no acute distress.  Neck:  Supple; no masses or thyromegaly. No significant cervical adenopathy. Lungs:  Clear throughout to auscultation.   No wheezes, crackles, or rhonchi. No acute distress. Heart:  Regular rate and rhythm; no murmurs, clicks, rubs,  or gallops. Abdomen: Non-distended, normal bowel sounds.  Soft and nontender without appreciable mass or hepatosplenomegaly.   Impression: Debilitated 63 year old lady with a history of reflux esophagitis/GERD, nausea, belching and constipation in setting of, what  I suspect to be, poorly controlled diabetes alone  with  heavy duty daily narcotic use.  She is relatively constipated.  The etiology of her current GI symptoms may be multifactorial in origin.  Upper GI tract symptoms not typical gallbladder disease but this needs to be  kept in the differential.  Delayed gastric emptying may be more likely in this clinical setting.  She may, in fact, have a diffuse GI motility disorder related to diabetes and narcotic use.  Recommendations:  Continue protonix 40 mg twice daily  Take Miralax 17 grams orally every other  night at bedtime  Solid phase GES to evaluate nausea and belching in a diabetic on chronic narcotic therapy  Need result of cologard testing - we are looking for it.  Office visit with [place] in 3 months  Further recommendations to follow  Addendum: After patient checked out, we received labs from the [facility].  Labs reveal the patient was found to have a hepatitis C antibody on October 06, 2018.  PCR testing revealed 13,400,000 RNA IUD/mL.  No mention of this by caregiver or patient.  We will obtain a hepatic ultrasound and see her back as scheduled to discuss treatment options.  We will also get virus genotyped  Notice: This dictation was prepared with Dragon dictation along with smaller phrase technology. Any transcriptional errors that result from this process are unintentional and may not be corrected upon review.

## 2018-10-14 DIAGNOSIS — F5105 Insomnia due to other mental disorder: Secondary | ICD-10-CM | POA: Diagnosis not present

## 2018-10-14 DIAGNOSIS — F331 Major depressive disorder, recurrent, moderate: Secondary | ICD-10-CM | POA: Diagnosis not present

## 2018-10-14 DIAGNOSIS — F064 Anxiety disorder due to known physiological condition: Secondary | ICD-10-CM | POA: Diagnosis not present

## 2018-10-17 DIAGNOSIS — J441 Chronic obstructive pulmonary disease with (acute) exacerbation: Secondary | ICD-10-CM | POA: Diagnosis not present

## 2018-10-17 DIAGNOSIS — D649 Anemia, unspecified: Secondary | ICD-10-CM | POA: Diagnosis not present

## 2018-10-17 DIAGNOSIS — F321 Major depressive disorder, single episode, moderate: Secondary | ICD-10-CM | POA: Diagnosis not present

## 2018-10-17 DIAGNOSIS — J4521 Mild intermittent asthma with (acute) exacerbation: Secondary | ICD-10-CM | POA: Diagnosis not present

## 2018-10-17 DIAGNOSIS — G8929 Other chronic pain: Secondary | ICD-10-CM | POA: Diagnosis not present

## 2018-10-17 DIAGNOSIS — F419 Anxiety disorder, unspecified: Secondary | ICD-10-CM | POA: Diagnosis not present

## 2018-10-17 DIAGNOSIS — F17210 Nicotine dependence, cigarettes, uncomplicated: Secondary | ICD-10-CM | POA: Diagnosis not present

## 2018-10-27 DIAGNOSIS — F17291 Nicotine dependence, other tobacco product, in remission: Secondary | ICD-10-CM | POA: Diagnosis not present

## 2018-10-27 DIAGNOSIS — B1920 Unspecified viral hepatitis C without hepatic coma: Secondary | ICD-10-CM | POA: Diagnosis not present

## 2018-10-29 ENCOUNTER — Telehealth: Payer: Self-pay

## 2018-10-29 DIAGNOSIS — R768 Other specified abnormal immunological findings in serum: Secondary | ICD-10-CM

## 2018-10-29 NOTE — Telephone Encounter (Signed)
Addendum: After patient checked out, we received labs from the [facility].  Labs reveal the patient was found to have a hepatitis C antibody on October 06, 2018.  PCR testing revealed 13,400,000 RNA IUD/mL.  No mention of this by caregiver or patient.  We will obtain a hepatic ultrasound and see her back as scheduled to discuss treatment options.  We will also get virus genotyped   I've tried calling the [facility], I was placed on hold several times. Will call back in the morning. Pt will need virus Genotype and a Hepatic u/s per RMR.   RGA see above addendum note which is also in pts last ov note. Schedule u/s

## 2018-10-30 ENCOUNTER — Telehealth: Payer: Self-pay | Admitting: *Deleted

## 2018-10-30 NOTE — Telephone Encounter (Signed)
Spoke with the receptionist and was transferred to a VM. Waiting on a return call.

## 2018-10-30 NOTE — Telephone Encounter (Addendum)
I have sent Staff message to RMR regarding orders. Will await response

## 2018-11-03 NOTE — Telephone Encounter (Signed)
U/s scheduled for 11/11/2018 at 9:30am, arrival time 9:15am, npo after midnight  [facility] center and was advised to fax appt details to [phone] attn: [email or web address]. I have done so.

## 2018-11-03 NOTE — Addendum Note (Signed)
Addended by: Tommie Sams on: 11/03/2018 08:18 AM   Modules accepted: Orders

## 2018-11-05 ENCOUNTER — Other Ambulatory Visit: Payer: Self-pay

## 2018-11-05 DIAGNOSIS — B1920 Unspecified viral hepatitis C without hepatic coma: Secondary | ICD-10-CM

## 2018-11-06 DIAGNOSIS — I739 Peripheral vascular disease, unspecified: Secondary | ICD-10-CM | POA: Diagnosis not present

## 2018-11-06 DIAGNOSIS — E119 Type 2 diabetes mellitus without complications: Secondary | ICD-10-CM | POA: Diagnosis not present

## 2018-11-06 DIAGNOSIS — B1920 Unspecified viral hepatitis C without hepatic coma: Secondary | ICD-10-CM | POA: Diagnosis not present

## 2018-11-06 DIAGNOSIS — N3281 Overactive bladder: Secondary | ICD-10-CM | POA: Diagnosis not present

## 2018-11-06 DIAGNOSIS — L853 Xerosis cutis: Secondary | ICD-10-CM | POA: Diagnosis not present

## 2018-11-06 DIAGNOSIS — K580 Irritable bowel syndrome with diarrhea: Secondary | ICD-10-CM | POA: Diagnosis not present

## 2018-11-06 DIAGNOSIS — K219 Gastro-esophageal reflux disease without esophagitis: Secondary | ICD-10-CM | POA: Diagnosis not present

## 2018-11-06 DIAGNOSIS — J302 Other seasonal allergic rhinitis: Secondary | ICD-10-CM | POA: Diagnosis not present

## 2018-11-06 DIAGNOSIS — R54 Age-related physical debility: Secondary | ICD-10-CM | POA: Diagnosis not present

## 2018-11-06 DIAGNOSIS — G894 Chronic pain syndrome: Secondary | ICD-10-CM | POA: Diagnosis not present

## 2018-11-06 DIAGNOSIS — J449 Chronic obstructive pulmonary disease, unspecified: Secondary | ICD-10-CM | POA: Diagnosis not present

## 2018-11-06 DIAGNOSIS — M818 Other osteoporosis without current pathological fracture: Secondary | ICD-10-CM | POA: Diagnosis not present

## 2018-11-06 DIAGNOSIS — I509 Heart failure, unspecified: Secondary | ICD-10-CM | POA: Diagnosis not present

## 2018-11-07 DIAGNOSIS — D649 Anemia, unspecified: Secondary | ICD-10-CM | POA: Diagnosis not present

## 2018-11-07 DIAGNOSIS — E119 Type 2 diabetes mellitus without complications: Secondary | ICD-10-CM | POA: Diagnosis not present

## 2018-11-07 DIAGNOSIS — E559 Vitamin D deficiency, unspecified: Secondary | ICD-10-CM | POA: Diagnosis not present

## 2018-11-11 ENCOUNTER — Encounter (HOSPITAL_COMMUNITY): Payer: Self-pay

## 2018-11-11 ENCOUNTER — Ambulatory Visit (HOSPITAL_COMMUNITY)
Admission: RE | Admit: 2018-11-11 | Discharge: 2018-11-11 | Disposition: A | Discharge status: Home / Self Care | Payer: Medicare Other | Source: Ambulatory Visit | Attending: Internal Medicine | Admitting: Internal Medicine

## 2018-11-11 DIAGNOSIS — R768 Other specified abnormal immunological findings in serum: Secondary | ICD-10-CM

## 2018-11-11 DIAGNOSIS — R05 Cough: Secondary | ICD-10-CM | POA: Diagnosis not present

## 2018-11-11 DIAGNOSIS — J449 Chronic obstructive pulmonary disease, unspecified: Secondary | ICD-10-CM | POA: Diagnosis not present

## 2018-11-12 DIAGNOSIS — Z1231 Encounter for screening mammogram for malignant neoplasm of breast: Secondary | ICD-10-CM | POA: Diagnosis not present

## 2018-11-14 DIAGNOSIS — R05 Cough: Secondary | ICD-10-CM | POA: Diagnosis not present

## 2018-11-17 DIAGNOSIS — R918 Other nonspecific abnormal finding of lung field: Secondary | ICD-10-CM | POA: Diagnosis not present

## 2018-11-18 DIAGNOSIS — F064 Anxiety disorder due to known physiological condition: Secondary | ICD-10-CM | POA: Diagnosis not present

## 2018-11-18 DIAGNOSIS — F331 Major depressive disorder, recurrent, moderate: Secondary | ICD-10-CM | POA: Diagnosis not present

## 2018-11-18 DIAGNOSIS — F5105 Insomnia due to other mental disorder: Secondary | ICD-10-CM | POA: Diagnosis not present

## 2018-11-26 NOTE — Telephone Encounter (Signed)
I've left several messages for the [facility]. I was placed on hold for 7 mins today 11/26/18. The receptionist took a message after holding for 7 mins. I spoke with [employer] today and they have been trying to reach pt to have her redo her cologuard test. It's where they must setup the coloduard for this pt. They will explain further to pt/nurse when they call them back at [phone] option 3. Pt also needs the Hep C Genotype drawn through them. Waiting on a return call.

## 2018-12-01 ENCOUNTER — Other Ambulatory Visit: Payer: Self-pay | Admitting: Adult Health

## 2018-12-09 DIAGNOSIS — K219 Gastro-esophageal reflux disease without esophagitis: Secondary | ICD-10-CM | POA: Diagnosis not present

## 2018-12-09 DIAGNOSIS — M818 Other osteoporosis without current pathological fracture: Secondary | ICD-10-CM | POA: Diagnosis not present

## 2018-12-09 DIAGNOSIS — N3281 Overactive bladder: Secondary | ICD-10-CM | POA: Diagnosis not present

## 2018-12-09 DIAGNOSIS — E119 Type 2 diabetes mellitus without complications: Secondary | ICD-10-CM | POA: Diagnosis not present

## 2018-12-09 DIAGNOSIS — R54 Age-related physical debility: Secondary | ICD-10-CM | POA: Diagnosis not present

## 2018-12-09 DIAGNOSIS — I739 Peripheral vascular disease, unspecified: Secondary | ICD-10-CM | POA: Diagnosis not present

## 2018-12-09 DIAGNOSIS — K580 Irritable bowel syndrome with diarrhea: Secondary | ICD-10-CM | POA: Diagnosis not present

## 2018-12-09 DIAGNOSIS — J302 Other seasonal allergic rhinitis: Secondary | ICD-10-CM | POA: Diagnosis not present

## 2018-12-09 DIAGNOSIS — L853 Xerosis cutis: Secondary | ICD-10-CM | POA: Diagnosis not present

## 2018-12-09 DIAGNOSIS — I509 Heart failure, unspecified: Secondary | ICD-10-CM | POA: Diagnosis not present

## 2018-12-09 DIAGNOSIS — J449 Chronic obstructive pulmonary disease, unspecified: Secondary | ICD-10-CM | POA: Diagnosis not present

## 2018-12-09 DIAGNOSIS — G894 Chronic pain syndrome: Secondary | ICD-10-CM | POA: Diagnosis not present

## 2018-12-09 NOTE — Telephone Encounter (Signed)
Receptionist answered the phone. I explained the lab work needed and for them to contact [employer] to set up cologuard testing [phone]. They were asked to fax results of labs back to our office.

## 2018-12-11 DIAGNOSIS — H1033 Unspecified acute conjunctivitis, bilateral: Secondary | ICD-10-CM | POA: Diagnosis not present

## 2018-12-16 DIAGNOSIS — F064 Anxiety disorder due to known physiological condition: Secondary | ICD-10-CM | POA: Diagnosis not present

## 2018-12-16 DIAGNOSIS — F5105 Insomnia due to other mental disorder: Secondary | ICD-10-CM | POA: Diagnosis not present

## 2018-12-16 DIAGNOSIS — G40901 Epilepsy, unspecified, not intractable, with status epilepticus: Secondary | ICD-10-CM | POA: Diagnosis not present

## 2018-12-16 DIAGNOSIS — F331 Major depressive disorder, recurrent, moderate: Secondary | ICD-10-CM | POA: Diagnosis not present

## 2018-12-16 DIAGNOSIS — F330 Major depressive disorder, recurrent, mild: Secondary | ICD-10-CM | POA: Diagnosis not present

## 2018-12-30 DIAGNOSIS — F064 Anxiety disorder due to known physiological condition: Secondary | ICD-10-CM | POA: Diagnosis not present

## 2018-12-30 DIAGNOSIS — F5105 Insomnia due to other mental disorder: Secondary | ICD-10-CM | POA: Diagnosis not present

## 2018-12-30 DIAGNOSIS — F331 Major depressive disorder, recurrent, moderate: Secondary | ICD-10-CM | POA: Diagnosis not present

## 2019-01-09 DIAGNOSIS — I739 Peripheral vascular disease, unspecified: Secondary | ICD-10-CM | POA: Diagnosis not present

## 2019-01-09 DIAGNOSIS — N3281 Overactive bladder: Secondary | ICD-10-CM | POA: Diagnosis not present

## 2019-01-09 DIAGNOSIS — E119 Type 2 diabetes mellitus without complications: Secondary | ICD-10-CM | POA: Diagnosis not present

## 2019-01-09 DIAGNOSIS — R54 Age-related physical debility: Secondary | ICD-10-CM | POA: Diagnosis not present

## 2019-01-09 DIAGNOSIS — M818 Other osteoporosis without current pathological fracture: Secondary | ICD-10-CM | POA: Diagnosis not present

## 2019-01-09 DIAGNOSIS — J302 Other seasonal allergic rhinitis: Secondary | ICD-10-CM | POA: Diagnosis not present

## 2019-01-09 DIAGNOSIS — J449 Chronic obstructive pulmonary disease, unspecified: Secondary | ICD-10-CM | POA: Diagnosis not present

## 2019-01-09 DIAGNOSIS — K219 Gastro-esophageal reflux disease without esophagitis: Secondary | ICD-10-CM | POA: Diagnosis not present

## 2019-01-09 DIAGNOSIS — K580 Irritable bowel syndrome with diarrhea: Secondary | ICD-10-CM | POA: Diagnosis not present

## 2019-01-09 DIAGNOSIS — G894 Chronic pain syndrome: Secondary | ICD-10-CM | POA: Diagnosis not present

## 2019-01-09 DIAGNOSIS — L853 Xerosis cutis: Secondary | ICD-10-CM | POA: Diagnosis not present

## 2019-01-09 DIAGNOSIS — I509 Heart failure, unspecified: Secondary | ICD-10-CM | POA: Diagnosis not present

## 2019-01-12 ENCOUNTER — Telehealth: Payer: Self-pay | Admitting: *Deleted

## 2019-01-12 ENCOUNTER — Other Ambulatory Visit: Payer: Self-pay

## 2019-01-12 ENCOUNTER — Telehealth: Payer: Self-pay | Admitting: Gastroenterology

## 2019-01-12 ENCOUNTER — Encounter: Payer: Self-pay | Admitting: Gastroenterology

## 2019-01-12 ENCOUNTER — Ambulatory Visit (INDEPENDENT_AMBULATORY_CARE_PROVIDER_SITE_OTHER): Payer: Medicare Other | Admitting: Gastroenterology

## 2019-01-12 DIAGNOSIS — Z539 Procedure and treatment not carried out, unspecified reason: Secondary | ICD-10-CM

## 2019-01-12 NOTE — Progress Notes (Signed)
[facility] in [place] but could not get anyone to answer after multiple attempts.   No show.

## 2019-01-12 NOTE — Telephone Encounter (Signed)
I faxed a request for medical records to [facility] in [place],

## 2019-01-12 NOTE — Telephone Encounter (Signed)
Patient had a virtual visit scheduled and we were not able to get nursing home to answer phone.     Darl Pikes, please see if we can get records, RMR ordered a Cologuard, an abdominal U/S, Hep C genotype (labs) back in 10/2018. Also he ordered a Gastric emptying test per his office note but not clear if it was ever done.

## 2019-01-12 NOTE — Telephone Encounter (Signed)
I called [facility] x 2 for virutal visit with LSL today and I could not get anyone to answer the main #. The phone would just ring and ring. Patient is a no show

## 2019-01-13 ENCOUNTER — Ambulatory Visit (INDEPENDENT_AMBULATORY_CARE_PROVIDER_SITE_OTHER): Payer: Medicare Other | Admitting: Gastroenterology

## 2019-01-13 ENCOUNTER — Encounter: Payer: Self-pay | Admitting: Gastroenterology

## 2019-01-13 ENCOUNTER — Telehealth: Payer: Self-pay

## 2019-01-13 ENCOUNTER — Other Ambulatory Visit: Payer: Self-pay

## 2019-01-13 DIAGNOSIS — B1920 Unspecified viral hepatitis C without hepatic coma: Secondary | ICD-10-CM | POA: Diagnosis not present

## 2019-01-13 NOTE — Patient Instructions (Addendum)
1. Please have labs done, refer to lab order sheet provided.  Please be sure to obtain correct labs if labs are drawn at the [facility]. For any questions, call [phone].  2. Complete abdominal ultrasound to evaluate for cirrhosis, hepatitis C.

## 2019-01-13 NOTE — Progress Notes (Signed)
Primary Care Physician:  Audree Bane, DO Primary GI:  Roetta Sessions, MD   Patient Location: Home  Provider Location: [facility] office  Reason for Phone Visit: Hepatitis C, nausea/vomiting, GERD  Persons present on the phone encounter, with roles: Patient, myself (provider), Elinor Parkinson, CMA (updated meds and allergies), Rachael Fee caregiver (facility floor nursing supervisor)  Total time (minutes) spent on medical discussion: 33 minutes  Due to COVID-19, visit was conducted using telephonic method (no video was available).  Visit was requested by patient.  Virtual Visit via Telephone only  I connected with  Gloria Chapman on 01/13/19 at  8:30 AM EDT by telephone and verified that I am speaking with the correct person using two identifiers.   I discussed the limitations, risks, security and privacy concerns of performing an evaluation and management service by telephone and the availability of in person appointments. I also discussed with the patient that there may be a patient responsible charge related to this service. The patient expressed understanding and agreed to proceed. Chief Complaint  Patient presents with  . Nausea    burping and it taste like eggs,acid reflux occasionally     HPI:   Patient is a pleasant 63 year old female who presents for telephone visit regarding follow-up of nausea, GERD, hepatitis C, constipation.  Last seen in January.  At that time her reflux symptoms are better.  She is complaining of belching all the time and bringing up sour material on a regular basis.  No frank vomiting.  Chronically on Dilaudid and MS Contin.  Blood sugars ranging in the 200-300 range.  EGD in 2019 demonstrated Schatzki ring with reflux esophagitis status post Maloney dilation.  Has refused colonoscopies but previously agreeable to Cologuard/DNA testing but we have not been successful in getting this done through the nursing home.  We requested patient have HCV genotype  however the nursing home got another RNA.  Level in January was 9,300,000.  They sent an old ultrasound report from May 2019 showing liver measuring 17 cm with mild fatty liver.  Spleen was not evaluated.  Patient resides in the nursing home since 2016 after she developed MRSA postoperatively from hip replacement surgery.  Ultimately she had to have her prosthesis removed.  She has very limited mobility but is able to transfer from chair to bed etc.  I spoke to both the patient and floor nursing supervisor, Rachael Fee.  Patient aware of previously Hep C, has known for years. Never treated for it.  States she has been to a provider in the past but was never offered treatment.  According to [facility], caregiver, patient has not been to anyone besides [place] since she has been at the facility the past 4 years.  Patient reports history of prior remote blood transfusions +IV drug use remotely. No tattoos.    Per Merry Proud, Cologuard was never completed.  They sent off the specimen but results were indeterminate and the facility provider told him not to repeat it.      Patient states she has no vomiting.  She continues to burp a lot.  Smells like eggs.  Sometimes she has some vague abdominal discomfort with meals.  Weight is essentially stable between 220-225 pounds per nursing.  Heartburn is fairly well controlled.  Bowel movements are improved.  No blood in stool or melena.  No recent A1c.  Gearldine Bienenstock reports that patient has significant issues with noncompliance.  Often refusing blood draws when they come to obtain blood  at the facility.  She has been out to get ultrasound of the abdomen back in February but once she got there she refused to let them do it.  Patient reportedly is very manipulative, will often leave the facility knowing she is having a procedure or testing done but then once she gets there she refused to have it done.  She also displays drug-seeking behavior at the facility.  Often falls asleep as she is  willing herself down the hallway at the facility.  Then she will wake up requesting pain medication or Phenergan.  Consistently refusing baths, currently has not bathed in 3 weeks.  Patient does not want cologuard.   Rachael Fee, nursing manager, provided her personal cell phone number [phone]. Can text or call and use the patient's initials Gloria Chapman.   Current Outpatient Medications  Medication Sig Dispense Refill  . acetaminophen (TYLENOL) 325 MG tablet Take 650 mg by mouth every 6 (six) hours as needed for mild pain.    Marland Kitchen ammonium lactate (AMLACTIN) 12 % cream Apply topically as needed for dry skin.    Marland Kitchen aspirin 325 MG tablet Take 325 mg by mouth daily.    . AZO-CRANBERRY PO Take 2 tablets by mouth every 6 (six) hours as needed (urinary pain and burning).     . Calcium Carbonate-Vitamin D3 (CALCIUM 600-D) 600-400 MG-UNIT TABS Take 1 tablet by mouth daily.    Marland Kitchen dextromethorphan-guaiFENesin (ROBITUSSIN-DM) 10-100 MG/5ML liquid Take by mouth every 4 (four) hours as needed for cough.    . diphenhydrAMINE (BENADRYL) 25 mg capsule Take 25 mg by mouth every 6 (six) hours as needed.     . DULoxetine (CYMBALTA) 60 MG capsule Take 60 mg by mouth daily.    . fluticasone (FLONASE) 50 MCG/ACT nasal spray Place 1 spray into both nostrils daily.    . fluticasone furoate-vilanterol (BREO ELLIPTA) 100-25 MCG/INH AEPB Inhale 1 puff into the lungs daily.    . furosemide (LASIX) 40 MG tablet Take 40 mg by mouth daily.    Marland Kitchen gabapentin (NEURONTIN) 300 MG capsule Take 500 mg by mouth every 8 (eight) hours. Give 300 mg and 200 mg to equal 500 mg three times daily    . GLUCAGON HCL IJ Inject as directed.    Marland Kitchen guaiFENesin (MUCINEX) 600 MG 12 hr tablet Take 400 mg by mouth 2 (two) times daily.    Marland Kitchen HYDROmorphone (DILAUDID) 4 MG tablet Take 4 mg by mouth every 4 (four) hours as needed for severe pain.    . hydrOXYzine (ATARAX/VISTARIL) 25 MG tablet Take 25 mg by mouth 4 (four) times daily.    . insulin glargine  (LANTUS) 100 UNIT/ML injection Inject 25 Units into the skin daily.     . insulin lispro (HUMALOG KWIKPEN) 100 UNIT/ML KwikPen Inject into the skin. Inject as per sliding scale    . ipratropium-albuterol (DUONEB) 0.5-2.5 (3) MG/3ML SOLN     . levocetirizine (XYZAL) 5 MG tablet Take 5 mg by mouth every evening.    Marland Kitchen LORazepam (ATIVAN) 1 MG tablet Take 1 mg by mouth 3 (three) times daily.     . Melatonin 1 MG TABS Take 6 mg by mouth at bedtime.    . montelukast (SINGULAIR) 10 MG tablet Take 10 mg by mouth at bedtime.    Marland Kitchen morphine (MS CONTIN) 30 MG 12 hr tablet Take 30 mg by mouth every 8 (eight) hours as needed.     . ondansetron (ZOFRAN) 4 MG tablet Take 4 mg by  mouth every 12 (twelve) hours as needed for nausea or vomiting.    Marland Kitchen oxybutynin (DITROPAN-XL) 5 MG 24 hr tablet Take 10 mg by mouth daily.     . pantoprazole (PROTONIX) 40 MG tablet Take 40 mg by mouth 2 (two) times daily.     . phenazopyridine (PYRIDIUM) 95 MG tablet Take 95 mg by mouth as needed for pain.    . polyethylene glycol (MIRALAX / GLYCOLAX) packet Take 17 g by mouth daily as needed.    . Polyethylene Glycol 400 (BLINK TEARS) 0.25 % SOLN Apply to eye every 8 (eight) hours.    . potassium chloride (KLOR-CON SPRINKLE) 10 MEQ CR capsule Take 10 mEq by mouth daily.     . promethazine (PHENERGAN) 25 MG tablet Take by mouth.    . promethazine-phenylephrine (PROMETHAZINE VC) 6.25-5 MG/5ML SYRP Take 10 mLs by mouth every 6 (six) hours as needed for congestion.    . sitaGLIPtin (JANUVIA) 50 MG tablet Take 50 mg by mouth daily.    . traZODone (DESYREL) 100 MG tablet Take 300 mg by mouth at bedtime.     . trolamine salicylate (ASPERCREME) 10 % cream Apply 1 application topically as needed for muscle pain. 3 times daily     No current facility-administered medications for this visit.     Past Medical History:  Diagnosis Date  . Anemia   . Anxiety   . Arthritis   . COPD (chronic obstructive pulmonary disease) (HCC)   . Depression    . Dyspnea   . Fibromyalgia   . GERD (gastroesophageal reflux disease)   . Headache   . Heart failure (HCC)   . Heart murmur   . Hepatitis   . History of Clostridium difficile infection   . History of methicillin resistant staphylococcus aureus (MRSA)   . Insomnia   . Overactive bladder   . Pneumonia     Past Surgical History:  Procedure Laterality Date  . APPENDECTOMY    . BACK SURGERY     twice  . DIAGNOSTIC LAPAROSCOPY     checking for endometriosis  . ESOPHAGOGASTRODUODENOSCOPY (EGD) WITH PROPOFOL N/A 11/21/2017   Dr. Jena Gauss: Erosive reflux esophagitis, low-grade narrowing Schatzki ring status post dilation, small hiatal hernia.  Marland Kitchen FEMUR FRACTURE SURGERY Right    Rods placed  . HEMORRHOID SURGERY    . INNER EAR SURGERY Left   . MALONEY DILATION N/A 11/21/2017   Procedure: Elease Hashimoto DILATION;  Surgeon: Corbin Ade, MD;  Location: [facility];  Service: Endoscopy;  Laterality: N/A;  . NASAL SINUS SURGERY    . right hip surgery     [place]: "took my hip out due to MRSA" occurred after hip replacements    Family History  Problem Relation Age of Onset  . Cancer Father        unknown  . Colon cancer Neg Hx     Social History   Socioeconomic History  . Marital status: Divorced    Spouse name: Not on file  . Number of children: Not on file  . Years of education: Not on file  . Highest education level: Not on file  Occupational History  . Not on file  Social Needs  . Financial resource strain: Not on file  . Food insecurity:    Worry: Not on file    Inability: Not on file  . Transportation needs:    Medical: Not on file    Non-medical: Not on file  Tobacco Use  . Smoking  status: Current Some Day Smoker    Packs/day: 0.50    Types: Cigarettes  . Smokeless tobacco: Never Used  . Tobacco comment: smokes 1/2 pack every other day  Substance and Sexual Activity  . Alcohol use: No    Frequency: Never  . Drug use: No  . Sexual activity: Not on file  Lifestyle   . Physical activity:    Days per week: Not on file    Minutes per session: Not on file  . Stress: Not on file  Relationships  . Social connections:    Talks on phone: Not on file    Gets together: Not on file    Attends religious service: Not on file    Active member of club or organization: Not on file    Attends meetings of clubs or organizations: Not on file    Relationship status: Not on file  . Intimate partner violence:    Fear of current or ex partner: Not on file    Emotionally abused: Not on file    Physically abused: Not on file    Forced sexual activity: Not on file  Other Topics Concern  . Not on file  Social History Narrative  . Not on file      ROS:  General: Negative for anorexia, weight loss, fever, chills, fatigue, weakness. Eyes: Negative for vision changes.  ENT: Negative for hoarseness, difficulty swallowing , nasal congestion. CV: Negative for chest pain, angina, palpitations, dyspnea on exertion, peripheral edema.  Respiratory: Negative for dyspnea at rest, dyspnea on exertion, cough, sputum, wheezing.  GI: See history of present illness. GU:  Negative for dysuria, hematuria, urinary incontinence, urinary frequency, nocturnal urination.  MS: Negative for joint pain, low back pain.  Derm: Negative for rash or itching.  Neuro: Negative for weakness, abnormal sensation, seizure, frequent headaches, memory loss, confusion.  Psych: Negative for anxiety, depression, suicidal ideation, hallucinations.  Endo: Negative for unusual weight change.  Heme: Negative for bruising or bleeding. Allergy: Negative for rash or hives.   Observations/Objective: Pleasant cooperative.  Alert and oriented.  Otherwise exam unavailable.  Assessment and Plan: Pleasant 63 year old female presenting for virtual visit to follow-up GERD, suspected delayed gastric emptying, chronic hepatitis C.    Of note patient is not interested in pursuing Cologuard at this time.  Nursing  staff reports that specimen was collected but result was indeterminate and facility provider recommended holding off on repeat testing.  Patient is in agreement at this time.  History of chronic hepatitis C, hepatomegaly noted on ultrasound last year.  She denies previous treatment.  Remote history of IV drug use.  She is interested in pursuing treatment.  She will need to have further labs, ultrasound prior to determining further treatment.  She has chronic GERD, typical symptoms well controlled.  Complains of frequent malodorous belching, "smells like rotten eggs".  No vomiting.  Staff confirms that her weight has been stable.  Suspected delayed gastric emptying in the setting of chronic diabetes, polypharmacy with chronic narcotic use.  Patient's nurse he was a Baristafloor supervisor also reports that the patient demonstrates manipulative behavior and drug-seeking behavior.  Consistently refusing lab draws, testing which staff sent the patient for off site.  At this point probably would not be beneficial to pursue gastric emptying study as overall it would likely not change treatment strategies.  We will obtain labs, update abdominal ultrasound on site.  Patient's nurse, nursing Earna Codermanager,Brandy Perry, provided her personal cell phone number [phone] when needing tests  or results to ensure they are completed appropriately. Can text or call and use the patient's initials Gloria Chapman.    Follow Up Instructions:    I discussed the assessment and treatment plan with the patient. The patient was provided an opportunity to ask questions and all were answered. The patient agreed with the plan and demonstrated an understanding of the instructions. AVS mailed to patient's home address.   The patient was advised to call back or seek an in-person evaluation if the symptoms worsen or if the condition fails to improve as anticipated.  I provided 33 minutes of non-face-to-face time during this encounter.   Tana Coast,  PA-C

## 2019-01-13 NOTE — Telephone Encounter (Signed)
US abdomen complete order faxed to [facility] (fax# [phone]). Confirmation received.

## 2019-01-14 NOTE — Progress Notes (Signed)
CC'ED TO PCP AND NURSING HOME

## 2019-01-15 DIAGNOSIS — F0391 Unspecified dementia with behavioral disturbance: Secondary | ICD-10-CM | POA: Diagnosis not present

## 2019-01-15 DIAGNOSIS — D518 Other vitamin B12 deficiency anemias: Secondary | ICD-10-CM | POA: Diagnosis not present

## 2019-01-21 ENCOUNTER — Telehealth: Payer: Self-pay | Admitting: Internal Medicine

## 2019-01-21 NOTE — Telephone Encounter (Signed)
Noted. Labs faxed per nurses request.

## 2019-01-21 NOTE — Telephone Encounter (Signed)
Gloria Chapman from the [facility] in [place] called to say that the lab orders that were sent back with the patient have been misplaced and could we refax them to her attention at fax # [phone]

## 2019-01-23 DIAGNOSIS — K580 Irritable bowel syndrome with diarrhea: Secondary | ICD-10-CM | POA: Diagnosis not present

## 2019-01-23 DIAGNOSIS — I509 Heart failure, unspecified: Secondary | ICD-10-CM | POA: Diagnosis not present

## 2019-01-23 DIAGNOSIS — E119 Type 2 diabetes mellitus without complications: Secondary | ICD-10-CM | POA: Diagnosis not present

## 2019-01-23 DIAGNOSIS — B182 Chronic viral hepatitis C: Secondary | ICD-10-CM | POA: Diagnosis not present

## 2019-01-23 DIAGNOSIS — M818 Other osteoporosis without current pathological fracture: Secondary | ICD-10-CM | POA: Diagnosis not present

## 2019-01-23 DIAGNOSIS — I739 Peripheral vascular disease, unspecified: Secondary | ICD-10-CM | POA: Diagnosis not present

## 2019-01-23 DIAGNOSIS — J449 Chronic obstructive pulmonary disease, unspecified: Secondary | ICD-10-CM | POA: Diagnosis not present

## 2019-01-23 DIAGNOSIS — L853 Xerosis cutis: Secondary | ICD-10-CM | POA: Diagnosis not present

## 2019-01-23 DIAGNOSIS — K219 Gastro-esophageal reflux disease without esophagitis: Secondary | ICD-10-CM | POA: Diagnosis not present

## 2019-01-23 DIAGNOSIS — J302 Other seasonal allergic rhinitis: Secondary | ICD-10-CM | POA: Diagnosis not present

## 2019-01-23 DIAGNOSIS — R54 Age-related physical debility: Secondary | ICD-10-CM | POA: Diagnosis not present

## 2019-01-23 DIAGNOSIS — H1033 Unspecified acute conjunctivitis, bilateral: Secondary | ICD-10-CM | POA: Diagnosis not present

## 2019-02-05 DIAGNOSIS — R262 Difficulty in walking, not elsewhere classified: Secondary | ICD-10-CM | POA: Diagnosis not present

## 2019-02-05 DIAGNOSIS — I509 Heart failure, unspecified: Secondary | ICD-10-CM | POA: Diagnosis not present

## 2019-02-05 DIAGNOSIS — M6281 Muscle weakness (generalized): Secondary | ICD-10-CM | POA: Diagnosis not present

## 2019-02-05 DIAGNOSIS — J441 Chronic obstructive pulmonary disease with (acute) exacerbation: Secondary | ICD-10-CM | POA: Diagnosis not present

## 2019-02-06 DIAGNOSIS — J441 Chronic obstructive pulmonary disease with (acute) exacerbation: Secondary | ICD-10-CM | POA: Diagnosis not present

## 2019-02-06 DIAGNOSIS — I509 Heart failure, unspecified: Secondary | ICD-10-CM | POA: Diagnosis not present

## 2019-02-06 DIAGNOSIS — B182 Chronic viral hepatitis C: Secondary | ICD-10-CM | POA: Diagnosis not present

## 2019-02-06 DIAGNOSIS — E119 Type 2 diabetes mellitus without complications: Secondary | ICD-10-CM | POA: Diagnosis not present

## 2019-02-10 DIAGNOSIS — R160 Hepatomegaly, not elsewhere classified: Secondary | ICD-10-CM | POA: Diagnosis not present

## 2019-02-19 DIAGNOSIS — E7800 Pure hypercholesterolemia, unspecified: Secondary | ICD-10-CM | POA: Diagnosis not present

## 2019-02-19 DIAGNOSIS — D649 Anemia, unspecified: Secondary | ICD-10-CM | POA: Diagnosis not present

## 2019-02-19 DIAGNOSIS — E785 Hyperlipidemia, unspecified: Secondary | ICD-10-CM | POA: Diagnosis not present

## 2019-02-19 DIAGNOSIS — E039 Hypothyroidism, unspecified: Secondary | ICD-10-CM | POA: Diagnosis not present

## 2019-02-19 DIAGNOSIS — Z79899 Other long term (current) drug therapy: Secondary | ICD-10-CM | POA: Diagnosis not present

## 2019-02-19 DIAGNOSIS — E119 Type 2 diabetes mellitus without complications: Secondary | ICD-10-CM | POA: Diagnosis not present

## 2019-02-19 DIAGNOSIS — E559 Vitamin D deficiency, unspecified: Secondary | ICD-10-CM | POA: Diagnosis not present

## 2019-03-05 DIAGNOSIS — I509 Heart failure, unspecified: Secondary | ICD-10-CM | POA: Diagnosis not present

## 2019-03-05 DIAGNOSIS — K219 Gastro-esophageal reflux disease without esophagitis: Secondary | ICD-10-CM | POA: Diagnosis not present

## 2019-03-05 DIAGNOSIS — E559 Vitamin D deficiency, unspecified: Secondary | ICD-10-CM | POA: Diagnosis not present

## 2019-03-05 DIAGNOSIS — J441 Chronic obstructive pulmonary disease with (acute) exacerbation: Secondary | ICD-10-CM | POA: Diagnosis not present

## 2019-03-12 DIAGNOSIS — K219 Gastro-esophageal reflux disease without esophagitis: Secondary | ICD-10-CM | POA: Diagnosis not present

## 2019-03-12 DIAGNOSIS — I509 Heart failure, unspecified: Secondary | ICD-10-CM | POA: Diagnosis not present

## 2019-03-12 DIAGNOSIS — J441 Chronic obstructive pulmonary disease with (acute) exacerbation: Secondary | ICD-10-CM | POA: Diagnosis not present

## 2019-03-12 DIAGNOSIS — M6281 Muscle weakness (generalized): Secondary | ICD-10-CM | POA: Diagnosis not present

## 2019-03-23 DIAGNOSIS — Z20828 Contact with and (suspected) exposure to other viral communicable diseases: Secondary | ICD-10-CM | POA: Diagnosis not present

## 2019-03-31 DIAGNOSIS — F419 Anxiety disorder, unspecified: Secondary | ICD-10-CM | POA: Diagnosis not present

## 2019-03-31 DIAGNOSIS — F339 Major depressive disorder, recurrent, unspecified: Secondary | ICD-10-CM | POA: Diagnosis not present

## 2019-04-08 DIAGNOSIS — M79675 Pain in left toe(s): Secondary | ICD-10-CM | POA: Diagnosis not present

## 2019-04-08 DIAGNOSIS — M2042 Other hammer toe(s) (acquired), left foot: Secondary | ICD-10-CM | POA: Diagnosis not present

## 2019-04-08 DIAGNOSIS — B351 Tinea unguium: Secondary | ICD-10-CM | POA: Diagnosis not present

## 2019-04-08 DIAGNOSIS — E119 Type 2 diabetes mellitus without complications: Secondary | ICD-10-CM | POA: Diagnosis not present

## 2019-04-10 DIAGNOSIS — G629 Polyneuropathy, unspecified: Secondary | ICD-10-CM | POA: Diagnosis not present

## 2019-04-10 DIAGNOSIS — G4700 Insomnia, unspecified: Secondary | ICD-10-CM | POA: Diagnosis not present

## 2019-04-10 DIAGNOSIS — I509 Heart failure, unspecified: Secondary | ICD-10-CM | POA: Diagnosis not present

## 2019-04-10 DIAGNOSIS — J441 Chronic obstructive pulmonary disease with (acute) exacerbation: Secondary | ICD-10-CM | POA: Diagnosis not present

## 2019-04-16 DIAGNOSIS — E119 Type 2 diabetes mellitus without complications: Secondary | ICD-10-CM | POA: Diagnosis not present

## 2019-04-16 DIAGNOSIS — M6281 Muscle weakness (generalized): Secondary | ICD-10-CM | POA: Diagnosis not present

## 2019-04-16 DIAGNOSIS — I509 Heart failure, unspecified: Secondary | ICD-10-CM | POA: Diagnosis not present

## 2019-04-16 DIAGNOSIS — K219 Gastro-esophageal reflux disease without esophagitis: Secondary | ICD-10-CM | POA: Diagnosis not present

## 2019-04-24 DIAGNOSIS — F339 Major depressive disorder, recurrent, unspecified: Secondary | ICD-10-CM | POA: Diagnosis not present

## 2019-04-24 DIAGNOSIS — F419 Anxiety disorder, unspecified: Secondary | ICD-10-CM | POA: Diagnosis not present

## 2019-05-01 DIAGNOSIS — F339 Major depressive disorder, recurrent, unspecified: Secondary | ICD-10-CM | POA: Diagnosis not present

## 2019-05-01 DIAGNOSIS — F419 Anxiety disorder, unspecified: Secondary | ICD-10-CM | POA: Diagnosis not present

## 2019-05-07 DIAGNOSIS — F339 Major depressive disorder, recurrent, unspecified: Secondary | ICD-10-CM | POA: Diagnosis not present

## 2019-05-08 DIAGNOSIS — J441 Chronic obstructive pulmonary disease with (acute) exacerbation: Secondary | ICD-10-CM | POA: Diagnosis not present

## 2019-05-08 DIAGNOSIS — G8929 Other chronic pain: Secondary | ICD-10-CM | POA: Diagnosis not present

## 2019-05-08 DIAGNOSIS — F339 Major depressive disorder, recurrent, unspecified: Secondary | ICD-10-CM | POA: Diagnosis not present

## 2019-05-08 DIAGNOSIS — I509 Heart failure, unspecified: Secondary | ICD-10-CM | POA: Diagnosis not present

## 2019-05-11 DIAGNOSIS — F339 Major depressive disorder, recurrent, unspecified: Secondary | ICD-10-CM | POA: Diagnosis not present

## 2019-05-11 DIAGNOSIS — G4700 Insomnia, unspecified: Secondary | ICD-10-CM | POA: Diagnosis not present

## 2019-05-12 DIAGNOSIS — R350 Frequency of micturition: Secondary | ICD-10-CM | POA: Diagnosis not present

## 2019-05-12 DIAGNOSIS — E559 Vitamin D deficiency, unspecified: Secondary | ICD-10-CM | POA: Diagnosis not present

## 2019-05-12 DIAGNOSIS — E039 Hypothyroidism, unspecified: Secondary | ICD-10-CM | POA: Diagnosis not present

## 2019-05-12 DIAGNOSIS — I10 Essential (primary) hypertension: Secondary | ICD-10-CM | POA: Diagnosis not present

## 2019-05-12 DIAGNOSIS — E785 Hyperlipidemia, unspecified: Secondary | ICD-10-CM | POA: Diagnosis not present

## 2019-05-12 DIAGNOSIS — E119 Type 2 diabetes mellitus without complications: Secondary | ICD-10-CM | POA: Diagnosis not present

## 2019-05-12 DIAGNOSIS — D649 Anemia, unspecified: Secondary | ICD-10-CM | POA: Diagnosis not present

## 2019-05-13 DIAGNOSIS — R319 Hematuria, unspecified: Secondary | ICD-10-CM | POA: Diagnosis not present

## 2019-05-13 DIAGNOSIS — N3281 Overactive bladder: Secondary | ICD-10-CM | POA: Diagnosis not present

## 2019-05-13 DIAGNOSIS — N390 Urinary tract infection, site not specified: Secondary | ICD-10-CM | POA: Diagnosis not present

## 2019-05-14 DIAGNOSIS — R1312 Dysphagia, oropharyngeal phase: Secondary | ICD-10-CM | POA: Diagnosis not present

## 2019-05-14 DIAGNOSIS — M6281 Muscle weakness (generalized): Secondary | ICD-10-CM | POA: Diagnosis not present

## 2019-05-14 DIAGNOSIS — K219 Gastro-esophageal reflux disease without esophagitis: Secondary | ICD-10-CM | POA: Diagnosis not present

## 2019-05-14 DIAGNOSIS — R262 Difficulty in walking, not elsewhere classified: Secondary | ICD-10-CM | POA: Diagnosis not present

## 2019-05-21 DIAGNOSIS — F419 Anxiety disorder, unspecified: Secondary | ICD-10-CM | POA: Diagnosis not present

## 2019-05-26 ENCOUNTER — Ambulatory Visit: Payer: Medicare Other | Admitting: Gastroenterology

## 2019-05-26 ENCOUNTER — Encounter: Payer: Self-pay | Admitting: Internal Medicine

## 2019-05-26 DIAGNOSIS — Z20828 Contact with and (suspected) exposure to other viral communicable diseases: Secondary | ICD-10-CM | POA: Diagnosis not present

## 2019-05-29 DIAGNOSIS — N182 Chronic kidney disease, stage 2 (mild): Secondary | ICD-10-CM | POA: Diagnosis not present

## 2019-05-29 DIAGNOSIS — E1169 Type 2 diabetes mellitus with other specified complication: Secondary | ICD-10-CM | POA: Diagnosis not present

## 2019-05-29 DIAGNOSIS — I130 Hypertensive heart and chronic kidney disease with heart failure and stage 1 through stage 4 chronic kidney disease, or unspecified chronic kidney disease: Secondary | ICD-10-CM | POA: Diagnosis not present

## 2019-05-29 DIAGNOSIS — L210 Seborrhea capitis: Secondary | ICD-10-CM | POA: Diagnosis not present

## 2019-05-30 DIAGNOSIS — J441 Chronic obstructive pulmonary disease with (acute) exacerbation: Secondary | ICD-10-CM | POA: Diagnosis not present

## 2019-05-30 DIAGNOSIS — F339 Major depressive disorder, recurrent, unspecified: Secondary | ICD-10-CM | POA: Diagnosis not present

## 2019-06-02 DIAGNOSIS — Z20828 Contact with and (suspected) exposure to other viral communicable diseases: Secondary | ICD-10-CM | POA: Diagnosis not present

## 2019-06-05 DIAGNOSIS — F419 Anxiety disorder, unspecified: Secondary | ICD-10-CM | POA: Diagnosis not present

## 2019-06-05 DIAGNOSIS — F339 Major depressive disorder, recurrent, unspecified: Secondary | ICD-10-CM | POA: Diagnosis not present

## 2019-06-11 DIAGNOSIS — R1312 Dysphagia, oropharyngeal phase: Secondary | ICD-10-CM | POA: Diagnosis not present

## 2019-06-11 DIAGNOSIS — I509 Heart failure, unspecified: Secondary | ICD-10-CM | POA: Diagnosis not present

## 2019-06-11 DIAGNOSIS — M6281 Muscle weakness (generalized): Secondary | ICD-10-CM | POA: Diagnosis not present

## 2019-06-11 DIAGNOSIS — K219 Gastro-esophageal reflux disease without esophagitis: Secondary | ICD-10-CM | POA: Diagnosis not present

## 2019-06-12 DIAGNOSIS — I130 Hypertensive heart and chronic kidney disease with heart failure and stage 1 through stage 4 chronic kidney disease, or unspecified chronic kidney disease: Secondary | ICD-10-CM | POA: Diagnosis not present

## 2019-06-12 DIAGNOSIS — E785 Hyperlipidemia, unspecified: Secondary | ICD-10-CM | POA: Diagnosis not present

## 2019-06-12 DIAGNOSIS — N182 Chronic kidney disease, stage 2 (mild): Secondary | ICD-10-CM | POA: Diagnosis not present

## 2019-06-12 DIAGNOSIS — E1122 Type 2 diabetes mellitus with diabetic chronic kidney disease: Secondary | ICD-10-CM | POA: Diagnosis not present

## 2019-06-16 ENCOUNTER — Encounter: Payer: Self-pay | Admitting: Gastroenterology

## 2019-06-16 ENCOUNTER — Ambulatory Visit (INDEPENDENT_AMBULATORY_CARE_PROVIDER_SITE_OTHER): Payer: Medicare Other | Admitting: Gastroenterology

## 2019-06-16 ENCOUNTER — Telehealth: Payer: Self-pay

## 2019-06-16 ENCOUNTER — Other Ambulatory Visit: Payer: Self-pay

## 2019-06-16 VITALS — BP 116/82 | HR 79 | Temp 97.1°F | Ht 62.0 in | Wt 210.0 lb

## 2019-06-16 DIAGNOSIS — B182 Chronic viral hepatitis C: Secondary | ICD-10-CM | POA: Diagnosis not present

## 2019-06-16 DIAGNOSIS — R110 Nausea: Secondary | ICD-10-CM

## 2019-06-16 DIAGNOSIS — K219 Gastro-esophageal reflux disease without esophagitis: Secondary | ICD-10-CM

## 2019-06-16 DIAGNOSIS — R1084 Generalized abdominal pain: Secondary | ICD-10-CM

## 2019-06-16 DIAGNOSIS — R6881 Early satiety: Secondary | ICD-10-CM | POA: Diagnosis not present

## 2019-06-16 NOTE — Patient Instructions (Addendum)
1. I need a copy of labs that I ordered after 12/2018 faxed to [phone].  2. I need copy of abdominal ultrasound ordered after 12/2018 faxed to [phone].  3. Gastric emptying study as scheduled (DO NOT GO IF YOU ARE UNDER QUARANTINE, CALL AND CANCEL).  4. Please call us and let us know if patient is currently under quarantine, [phone].

## 2019-06-16 NOTE — Progress Notes (Signed)
Primary Care Physician: Renata Caprice, DO  Primary Gastroenterologist:  Garfield Cornea, MD   Chief Complaint  Patient presents with  . Abdominal Pain  . Hepatitis C    HPI: Gloria Chapman is a 63 y.o. female here for follow-up.  She had a virtual visit in April for nausea, GERD, hepatitis C, constipation.  EGD in 2019 demonstrated Schatzki ring with reflux esophagitis status post Maloney dilation.  She has refused colonoscopies but previously agreeable to Cologuard testing but we have not been successful getting this done through the nursing home.  According to nursing staff at time of her last virtual visit, patient sent specimen off for Cologuard but was indeterminate and the facility provider told them not repeated.  When I spoke to the patient last in April she declined Cologuard.  We have also had difficulty getting the appropriate labs and ultrasound done to consider hepatitis C treatment.  Patient states she is known about her hepatitis C for years but never was treated.  She has a history of prior remote blood transfusions and IV drug use.  Historically patient has had problems with noncompliance.  According to nursing staff at time of her last virtual visit, she would often leave the facility for testing such as labs or ultrasound but when she presented to those locations for testing she would refuse.  Also displays drug-seeking behavior at the facility or nursing staff.  Patient presents to the office today wanting to get her hepatitis C treated and asked to deal with her excessive malodorous belching.  Complains of early satiety, poor appetite.  Nausea but no vomiting.  She belches she smells rotten food.  Frequent heartburn at night.  On pantoprazole 40 mg twice daily.  She has chronic abdominal pain, centrally located.  Not worse with meals.  No better with bowel movements.  Bowel movements have been regular.  No blood in stool or melena.  Patient wants to get the "egg test  done".  She wants her hepatitis C treated.  She does not want a colonoscopy or Cologuard.   Current Outpatient Medications  Medication Sig Dispense Refill  . acetaminophen (TYLENOL) 325 MG tablet Take 650 mg by mouth every 6 (six) hours as needed for mild pain.    Marland Kitchen ammonium lactate (AMLACTIN) 12 % cream Apply topically as needed for dry skin.    Marland Kitchen aspirin 325 MG tablet Take 325 mg by mouth daily.    . AZO-CRANBERRY PO Take 2 tablets by mouth every 6 (six) hours as needed (urinary pain and burning).     . Calcium Carbonate-Vitamin D3 (CALCIUM 600-D) 600-400 MG-UNIT TABS Take 1 tablet by mouth daily.    . Cholecalciferol 250 MCG (10000 UT) CAPS Take 1 capsule by mouth every morning.    Marland Kitchen dextromethorphan-guaiFENesin (ROBITUSSIN-DM) 10-100 MG/5ML liquid Take by mouth every 4 (four) hours as needed for cough.    . diphenhydrAMINE (BENADRYL) 25 mg capsule Take 25 mg by mouth every 6 (six) hours as needed.     . DULoxetine (CYMBALTA) 60 MG capsule Take 60 mg by mouth daily.    . enalapril (VASOTEC) 2.5 MG tablet Take 2.5 mg by mouth daily.    . fluticasone (FLONASE) 50 MCG/ACT nasal spray Place 1 spray into both nostrils daily.    . fluticasone furoate-vilanterol (BREO ELLIPTA) 100-25 MCG/INH AEPB Inhale 1 puff into the lungs daily.    . furosemide (LASIX) 40 MG tablet Take 40 mg by mouth daily.    Marland Kitchen  gabapentin (NEURONTIN) 300 MG capsule Take 500 mg by mouth every 8 (eight) hours. Give 300 mg and 200 mg to equal 500 mg three times daily    . GLUCAGON HCL IJ Inject as directed.    Marland Kitchen guaiFENesin (MUCINEX) 600 MG 12 hr tablet Take 400 mg by mouth 2 (two) times daily.    Marland Kitchen HYDROmorphone (DILAUDID) 4 MG tablet Take 4 mg by mouth every 4 (four) hours as needed for severe pain.    . hydrOXYzine (ATARAX/VISTARIL) 25 MG tablet Take 25 mg by mouth 4 (four) times daily.    . insulin glargine (LANTUS) 100 UNIT/ML injection Inject 25 Units into the skin daily.     . insulin lispro (HUMALOG KWIKPEN) 100 UNIT/ML  KwikPen Inject into the skin. Inject as per sliding scale    . ipratropium-albuterol (DUONEB) 0.5-2.5 (3) MG/3ML SOLN     . levocetirizine (XYZAL) 5 MG tablet Take 5 mg by mouth every evening.    Marland Kitchen LORazepam (ATIVAN) 1 MG tablet Take 1 mg by mouth 3 (three) times daily.     . Melatonin 1 MG TABS Take 6 mg by mouth at bedtime.    . montelukast (SINGULAIR) 10 MG tablet Take 10 mg by mouth at bedtime.    Marland Kitchen morphine (MS CONTIN) 30 MG 12 hr tablet Take 30 mg by mouth every 8 (eight) hours as needed.     . naloxone (NARCAN) nasal spray 4 mg/0.1 mL Place 1 spray into the nose.    . ondansetron (ZOFRAN) 4 MG tablet Take 4 mg by mouth every 12 (twelve) hours as needed for nausea or vomiting.    Marland Kitchen oxybutynin (DITROPAN-XL) 5 MG 24 hr tablet Take 10 mg by mouth daily.     . pantoprazole (PROTONIX) 40 MG tablet Take 40 mg by mouth 2 (two) times daily.     . phenazopyridine (PYRIDIUM) 95 MG tablet Take 95 mg by mouth as needed for pain.    . polyethylene glycol (MIRALAX / GLYCOLAX) packet Take 17 g by mouth daily as needed.    . Polyethylene Glycol 400 (BLINK TEARS) 0.25 % SOLN Apply to eye every 8 (eight) hours.    . potassium chloride (KLOR-CON SPRINKLE) 10 MEQ CR capsule Take 10 mEq by mouth daily.     . promethazine (PHENERGAN) 25 MG tablet Take by mouth.    . promethazine-phenylephrine (PROMETHAZINE VC) 6.25-5 MG/5ML SYRP Take 10 mLs by mouth every 6 (six) hours as needed for congestion.    . sitaGLIPtin (JANUVIA) 50 MG tablet Take 50 mg by mouth daily.    . traZODone (DESYREL) 100 MG tablet Take 300 mg by mouth at bedtime.     . trolamine salicylate (ASPERCREME) 10 % cream Apply 1 application topically as needed for muscle pain. 3 times daily     No current facility-administered medications for this visit.     Allergies as of 06/16/2019  . (No Known Allergies)    ROS:  General: Negative for weight loss, fever, chills, fatigue, weakness.  See HPI ENT: Negative for hoarseness, difficulty  swallowing , nasal congestion. CV: Negative for chest pain, angina, palpitations, dyspnea on exertion, peripheral edema.  Respiratory: Negative for dyspnea at rest, dyspnea on exertion, cough, sputum, wheezing.  GI: See history of present illness. GU:  Negative for dysuria, hematuria, urinary incontinence, urinary frequency, nocturnal urination.  Endo: Negative for unusual weight change.    Physical Examination:   BP 116/82   Pulse 79   Temp (!) 97.1 F (  36.2 C) (Oral)   Ht  (1.575 m)   Wt 210 lb (95.3 kg)   BMI 38.41 kg/m   General: Well-nourished, well-developed in no acute distress.  Eyes: No icterus. Mouth: Oropharyngeal mucosa moist and pink , no lesions erythema or exudate. Lungs: Clear to auscultation bilaterally.  Heart: Regular rate and rhythm, no murmurs rubs or gallops.  Abdomen: Bowel sounds are normal, nontender, nondistended, no hepatosplenomegaly or masses, no abdominal bruits or hernia , no rebound or guarding.   Extremities: No lower extremity edema. No clubbing or deformities. Neuro: Alert and oriented x 4   Skin: Warm and dry, no jaundice.   Psych: Alert and cooperative, normal mood and affect.

## 2019-06-16 NOTE — Assessment & Plan Note (Addendum)
She reports poor appetite, nausea without vomiting, early satiety, malodorous belching.  With history of diabetes, chronic narcotics suspect delayed gastric emptying.  Gastric emptying study in the near future.  Reinforced antireflux measures.  Continue pantoprazole 40 mg twice daily for now.   As we were completing the visit today, patient informed me that she is currently under quarantine at the nursing facility.  We have contacted facility to get more information.

## 2019-06-16 NOTE — Assessment & Plan Note (Signed)
Chronic hepatitis C with positive RNA.  Genotype still unknown, lab orders changed by facility.  Reordered labs in April, I have not received any records.  We have requested today.  She did not go for abdominal ultrasound ordered in April.  We cannot move forward with treatment until appropriate labs and ultrasound have been obtained.  Discussed this with patient today.

## 2019-06-16 NOTE — Telephone Encounter (Signed)
I called the [facility] @ [phone] and spoke to April the receptionist. I asked to speak to the pt's nurse and was told she is not available at this time. I asked if she could help me and was the pt under quarantine and she said yes. I asked why did she come then. April said they are checking weekly and pt has not tested positive. I asked if she is around other people who are positive and she said No, none of them are here now. Then she said "Let me get you someone to talk with". It finally went to a VM and said it was a general delivery VM. I said who I was and shy I was calling and to please give me a call back.

## 2019-06-17 ENCOUNTER — Telehealth: Payer: Self-pay | Admitting: *Deleted

## 2019-06-17 ENCOUNTER — Encounter: Payer: Self-pay | Admitting: *Deleted

## 2019-06-17 NOTE — Telephone Encounter (Addendum)
I spoke to pt's nurse today, Lorelee Market. She said pt is under quarantine, and she is not sure why they are going to appointments. She said I should speak to the DON, Helene Kelp. However, she could not find her at the moment. She will leave a message for Helene Kelp to call me and said if I do not hear back in a little while to feel free to call her.  I also asked her about the previous labs and [place] that was ordered in April and then the orders were refaxed in May. She told me to ask Helene Kelp when I speak to her because she has access to the labs etc.

## 2019-06-17 NOTE — Telephone Encounter (Signed)
Reviewed

## 2019-06-17 NOTE — Telephone Encounter (Signed)
GES scheduled for 10/8 at 8:00am, arrival 7:45am, npo midnight, no stomach meds after midnight. Made aware previous note patient has been tested for COVID-19 but has been neg so far.  [facility]ed [facility]. Was advised to fax appt information to [phone]. This has been faxed

## 2019-06-17 NOTE — Telephone Encounter (Signed)
Gloria Chapman.

## 2019-06-18 DIAGNOSIS — F419 Anxiety disorder, unspecified: Secondary | ICD-10-CM | POA: Diagnosis not present

## 2019-06-22 NOTE — Telephone Encounter (Addendum)
Gloria Chapman, The patient I saw 9/29 from [facility] in [place], actually tested positive for [place] and that's why she was supposed to be quarantined.

## 2019-06-22 NOTE — Telephone Encounter (Signed)
Noted  

## 2019-06-22 NOTE — Telephone Encounter (Signed)
I received a call from Helene Kelp, [place] at [facility] in [place].  She said she was calling to explain the Covid testing.  They were using a lab and got some results on 2 patients thought to be false positive.Gloria Chapman was one). They had 7 samples disqualified and lost. They placed everyone on Quarantine until they could follow up.  So then they started using their lab and tested everyone and no positives. She has the list of lab results needed and said she will get those faxed to [place]. Sending FYI to [employer].

## 2019-06-22 NOTE — Telephone Encounter (Signed)
Received a call the a nurse Christy at the [facility]. Labs requested 01/13/2019 weren't drawn on pt. The nurse read off all labs needed from April. They will have labs drawn in the morning.

## 2019-06-23 DIAGNOSIS — F339 Major depressive disorder, recurrent, unspecified: Secondary | ICD-10-CM | POA: Diagnosis not present

## 2019-06-23 DIAGNOSIS — N3281 Overactive bladder: Secondary | ICD-10-CM | POA: Diagnosis not present

## 2019-06-23 DIAGNOSIS — K219 Gastro-esophageal reflux disease without esophagitis: Secondary | ICD-10-CM | POA: Diagnosis not present

## 2019-06-23 DIAGNOSIS — F419 Anxiety disorder, unspecified: Secondary | ICD-10-CM | POA: Diagnosis not present

## 2019-06-24 DIAGNOSIS — Z79899 Other long term (current) drug therapy: Secondary | ICD-10-CM | POA: Diagnosis not present

## 2019-06-24 DIAGNOSIS — D8989 Other specified disorders involving the immune mechanism, not elsewhere classified: Secondary | ICD-10-CM | POA: Diagnosis not present

## 2019-06-24 DIAGNOSIS — A0472 Enterocolitis due to Clostridium difficile, not specified as recurrent: Secondary | ICD-10-CM | POA: Diagnosis not present

## 2019-06-25 ENCOUNTER — Other Ambulatory Visit: Payer: Self-pay

## 2019-06-25 ENCOUNTER — Encounter (HOSPITAL_COMMUNITY)
Admission: RE | Admit: 2019-06-25 | Discharge: 2019-06-25 | Disposition: A | Discharge status: Home / Self Care | Payer: Medicare Other | Source: Ambulatory Visit | Attending: Gastroenterology | Admitting: Gastroenterology

## 2019-06-25 ENCOUNTER — Encounter (HOSPITAL_COMMUNITY): Payer: Self-pay

## 2019-06-25 DIAGNOSIS — R6881 Early satiety: Secondary | ICD-10-CM | POA: Insufficient documentation

## 2019-06-25 DIAGNOSIS — K30 Functional dyspepsia: Secondary | ICD-10-CM | POA: Diagnosis not present

## 2019-06-25 DIAGNOSIS — K219 Gastro-esophageal reflux disease without esophagitis: Secondary | ICD-10-CM | POA: Diagnosis not present

## 2019-06-25 MED ORDER — TECHNETIUM TC 99M SULFUR COLLOID
2.0000 | Freq: Once | INTRAVENOUS | Status: AC | PRN
  Administered 2019-06-25: 08:00:00 2.1 via INTRAVENOUS

## 2019-07-02 DIAGNOSIS — J441 Chronic obstructive pulmonary disease with (acute) exacerbation: Secondary | ICD-10-CM | POA: Diagnosis not present

## 2019-07-02 DIAGNOSIS — F339 Major depressive disorder, recurrent, unspecified: Secondary | ICD-10-CM | POA: Diagnosis not present

## 2019-07-03 DIAGNOSIS — Z79899 Other long term (current) drug therapy: Secondary | ICD-10-CM | POA: Diagnosis not present

## 2019-07-03 DIAGNOSIS — D649 Anemia, unspecified: Secondary | ICD-10-CM | POA: Diagnosis not present

## 2019-07-10 DIAGNOSIS — K219 Gastro-esophageal reflux disease without esophagitis: Secondary | ICD-10-CM | POA: Diagnosis not present

## 2019-07-10 DIAGNOSIS — I509 Heart failure, unspecified: Secondary | ICD-10-CM | POA: Diagnosis not present

## 2019-07-10 DIAGNOSIS — R1312 Dysphagia, oropharyngeal phase: Secondary | ICD-10-CM | POA: Diagnosis not present

## 2019-07-10 DIAGNOSIS — M6281 Muscle weakness (generalized): Secondary | ICD-10-CM | POA: Diagnosis not present

## 2019-07-20 DIAGNOSIS — F330 Major depressive disorder, recurrent, mild: Secondary | ICD-10-CM | POA: Diagnosis not present

## 2019-07-20 NOTE — Telephone Encounter (Signed)
Please start approval process for Epclusa.   She will not be able to take pantoprazole while on Epclusa so we need to give nursing home instructions to stop now.   We can give orders for Pepcid 40mg  BID.

## 2019-07-20 NOTE — Telephone Encounter (Signed)
Received copy of labs from [facility] in [place] dated 07/03/2019  Glucose 164, BUN 11.4, creatinine 0.69, total bilirubin 0.2, albumin 4, ALT 10, AST 15, alkaline phosphatase 96, total bilirubin 0.2, white blood cell count 12,600, hemoglobin 16.1, MCV 86.1, platelets 154,000, hepatitis B core IgM negative, hepatitis B surface antibody negative, hepatitis B surface antigen negative.  Hepatitis B core total antibody nonreactive  FibroSure: Fibrosis score F0, necroinflammatory activity score 80 0  HCVRNA genotype 1b  HCV RNA 13,400,000 back in January 2020

## 2019-07-22 DIAGNOSIS — Z1159 Encounter for screening for other viral diseases: Secondary | ICD-10-CM | POA: Diagnosis not present

## 2019-07-23 NOTE — Telephone Encounter (Signed)
Noted. Paper work is ready to be signed by LSL to submit for [employer] lmom for pt's nurse to return call. I will be mailing a form that needs to be signed and returned to submit for Hep C meds since pt has medicare. Will discuss Pepcid order with nurse as well.

## 2019-07-27 DIAGNOSIS — Z1159 Encounter for screening for other viral diseases: Secondary | ICD-10-CM | POA: Diagnosis not present

## 2019-07-30 DIAGNOSIS — F419 Anxiety disorder, unspecified: Secondary | ICD-10-CM | POA: Diagnosis not present

## 2019-07-30 DIAGNOSIS — F330 Major depressive disorder, recurrent, mild: Secondary | ICD-10-CM | POA: Diagnosis not present

## 2019-08-05 DIAGNOSIS — J441 Chronic obstructive pulmonary disease with (acute) exacerbation: Secondary | ICD-10-CM | POA: Diagnosis not present

## 2019-08-05 DIAGNOSIS — F339 Major depressive disorder, recurrent, unspecified: Secondary | ICD-10-CM | POA: Diagnosis not present

## 2019-08-06 DIAGNOSIS — R262 Difficulty in walking, not elsewhere classified: Secondary | ICD-10-CM | POA: Diagnosis not present

## 2019-08-06 DIAGNOSIS — M6281 Muscle weakness (generalized): Secondary | ICD-10-CM | POA: Diagnosis not present

## 2019-08-06 DIAGNOSIS — I509 Heart failure, unspecified: Secondary | ICD-10-CM | POA: Diagnosis not present

## 2019-08-06 DIAGNOSIS — R1312 Dysphagia, oropharyngeal phase: Secondary | ICD-10-CM | POA: Diagnosis not present

## 2019-08-10 NOTE — Telephone Encounter (Signed)
Faxed form needed for submission to [place]. Faxed form that needs pts signature due to her medication to the [facility] with an attached letter to [phone] per [facility] receptionist. They are waiting on fax and will return it. D/c Pantoprazole and start Pepcid bid note is included in letter. Nurse is aware.

## 2019-08-17 DIAGNOSIS — Z1159 Encounter for screening for other viral diseases: Secondary | ICD-10-CM | POA: Diagnosis not present

## 2019-08-19 NOTE — Telephone Encounter (Signed)
Received a call from the [facility]. She received a call from [facility] and she wasn't sure if she needed to call them back to schedule delivery of the Epclusa. She was advised to call [facility] because they won't deliver the medication to our office without touching basis with them. The nurse is aware that after they talk with [facility], [facility] will contact our office to schedule the delivery of pts medication.

## 2019-08-26 ENCOUNTER — Telehealth: Payer: Self-pay

## 2019-08-26 NOTE — Telephone Encounter (Signed)
Received a VM from [facility] that they are having difficulty getting in touch at facility in order to deliver the pt's Epclusa.  Also, a fax from [facility].  Given to [place] who is taking care of this Hep C pt.

## 2019-08-26 NOTE — Telephone Encounter (Signed)
Called [facility] and was transferred to the main nursing station. I help for 10 mins waiting for someone to pickup the phone will call back.

## 2019-08-28 DIAGNOSIS — Z79899 Other long term (current) drug therapy: Secondary | ICD-10-CM | POA: Diagnosis not present

## 2019-08-28 DIAGNOSIS — F17200 Nicotine dependence, unspecified, uncomplicated: Secondary | ICD-10-CM | POA: Diagnosis not present

## 2019-08-28 DIAGNOSIS — Z760 Encounter for issue of repeat prescription: Secondary | ICD-10-CM | POA: Diagnosis not present

## 2019-08-28 DIAGNOSIS — F339 Major depressive disorder, recurrent, unspecified: Secondary | ICD-10-CM | POA: Diagnosis not present

## 2019-09-30 DIAGNOSIS — R1312 Dysphagia, oropharyngeal phase: Secondary | ICD-10-CM | POA: Diagnosis not present

## 2019-09-30 DIAGNOSIS — R262 Difficulty in walking, not elsewhere classified: Secondary | ICD-10-CM | POA: Diagnosis not present

## 2019-09-30 DIAGNOSIS — M6281 Muscle weakness (generalized): Secondary | ICD-10-CM | POA: Diagnosis not present

## 2019-09-30 DIAGNOSIS — I509 Heart failure, unspecified: Secondary | ICD-10-CM | POA: Diagnosis not present

## 2019-10-02 DIAGNOSIS — F339 Major depressive disorder, recurrent, unspecified: Secondary | ICD-10-CM | POA: Diagnosis not present

## 2019-10-02 DIAGNOSIS — J441 Chronic obstructive pulmonary disease with (acute) exacerbation: Secondary | ICD-10-CM | POA: Diagnosis not present

## 2019-10-09 DIAGNOSIS — R400 Somnolence: Secondary | ICD-10-CM | POA: Diagnosis not present

## 2019-10-09 DIAGNOSIS — R279 Unspecified lack of coordination: Secondary | ICD-10-CM | POA: Diagnosis not present

## 2019-10-09 DIAGNOSIS — R293 Abnormal posture: Secondary | ICD-10-CM | POA: Diagnosis not present

## 2019-10-09 DIAGNOSIS — M26622 Arthralgia of left temporomandibular joint: Secondary | ICD-10-CM | POA: Diagnosis not present

## 2019-10-09 DIAGNOSIS — R112 Nausea with vomiting, unspecified: Secondary | ICD-10-CM | POA: Diagnosis not present

## 2019-10-09 DIAGNOSIS — F330 Major depressive disorder, recurrent, mild: Secondary | ICD-10-CM | POA: Diagnosis not present

## 2019-10-09 DIAGNOSIS — I509 Heart failure, unspecified: Secondary | ICD-10-CM | POA: Diagnosis not present

## 2019-10-09 DIAGNOSIS — F17218 Nicotine dependence, cigarettes, with other nicotine-induced disorders: Secondary | ICD-10-CM | POA: Diagnosis not present

## 2019-10-09 DIAGNOSIS — H04123 Dry eye syndrome of bilateral lacrimal glands: Secondary | ICD-10-CM | POA: Diagnosis not present

## 2019-10-09 DIAGNOSIS — R1310 Dysphagia, unspecified: Secondary | ICD-10-CM | POA: Diagnosis not present

## 2019-10-09 DIAGNOSIS — Z79891 Long term (current) use of opiate analgesic: Secondary | ICD-10-CM | POA: Diagnosis not present

## 2019-10-09 DIAGNOSIS — R1110 Vomiting, unspecified: Secondary | ICD-10-CM | POA: Diagnosis not present

## 2019-10-09 DIAGNOSIS — M25512 Pain in left shoulder: Secondary | ICD-10-CM | POA: Diagnosis not present

## 2019-10-09 DIAGNOSIS — J309 Allergic rhinitis, unspecified: Secondary | ICD-10-CM | POA: Diagnosis not present

## 2019-10-09 DIAGNOSIS — Z741 Need for assistance with personal care: Secondary | ICD-10-CM | POA: Diagnosis not present

## 2019-10-09 DIAGNOSIS — Z1152 Encounter for screening for COVID-19: Secondary | ICD-10-CM | POA: Diagnosis not present

## 2019-10-09 DIAGNOSIS — Z7409 Other reduced mobility: Secondary | ICD-10-CM | POA: Diagnosis not present

## 2019-10-09 DIAGNOSIS — G4700 Insomnia, unspecified: Secondary | ICD-10-CM | POA: Diagnosis not present

## 2019-10-09 DIAGNOSIS — G894 Chronic pain syndrome: Secondary | ICD-10-CM | POA: Diagnosis not present

## 2019-10-09 DIAGNOSIS — R278 Other lack of coordination: Secondary | ICD-10-CM | POA: Diagnosis not present

## 2019-10-09 DIAGNOSIS — H6593 Unspecified nonsuppurative otitis media, bilateral: Secondary | ICD-10-CM | POA: Diagnosis not present

## 2019-10-09 DIAGNOSIS — J441 Chronic obstructive pulmonary disease with (acute) exacerbation: Secondary | ICD-10-CM | POA: Diagnosis not present

## 2019-10-09 DIAGNOSIS — H9202 Otalgia, left ear: Secondary | ICD-10-CM | POA: Diagnosis not present

## 2019-10-09 DIAGNOSIS — H2513 Age-related nuclear cataract, bilateral: Secondary | ICD-10-CM | POA: Diagnosis not present

## 2019-10-09 DIAGNOSIS — M6281 Muscle weakness (generalized): Secondary | ICD-10-CM | POA: Diagnosis not present

## 2019-10-09 DIAGNOSIS — H6983 Other specified disorders of Eustachian tube, bilateral: Secondary | ICD-10-CM | POA: Diagnosis not present

## 2019-10-09 DIAGNOSIS — U071 COVID-19: Secondary | ICD-10-CM | POA: Diagnosis not present

## 2019-10-09 DIAGNOSIS — R262 Difficulty in walking, not elsewhere classified: Secondary | ICD-10-CM | POA: Diagnosis not present

## 2019-10-09 DIAGNOSIS — N3289 Other specified disorders of bladder: Secondary | ICD-10-CM | POA: Diagnosis not present

## 2019-10-09 DIAGNOSIS — Z794 Long term (current) use of insulin: Secondary | ICD-10-CM | POA: Diagnosis not present

## 2019-10-09 DIAGNOSIS — H6123 Impacted cerumen, bilateral: Secondary | ICD-10-CM | POA: Diagnosis not present

## 2019-10-09 DIAGNOSIS — M62838 Other muscle spasm: Secondary | ICD-10-CM | POA: Diagnosis not present

## 2019-10-09 DIAGNOSIS — E039 Hypothyroidism, unspecified: Secondary | ICD-10-CM | POA: Diagnosis not present

## 2019-10-09 DIAGNOSIS — J0100 Acute maxillary sinusitis, unspecified: Secondary | ICD-10-CM | POA: Diagnosis not present

## 2019-10-09 DIAGNOSIS — G8929 Other chronic pain: Secondary | ICD-10-CM | POA: Diagnosis not present

## 2019-10-09 DIAGNOSIS — N343 Urethral syndrome, unspecified: Secondary | ICD-10-CM | POA: Diagnosis not present

## 2019-10-09 DIAGNOSIS — E559 Vitamin D deficiency, unspecified: Secondary | ICD-10-CM | POA: Diagnosis not present

## 2019-10-09 DIAGNOSIS — R319 Hematuria, unspecified: Secondary | ICD-10-CM | POA: Diagnosis not present

## 2019-10-09 DIAGNOSIS — F329 Major depressive disorder, single episode, unspecified: Secondary | ICD-10-CM | POA: Diagnosis not present

## 2019-10-09 DIAGNOSIS — M25551 Pain in right hip: Secondary | ICD-10-CM | POA: Diagnosis not present

## 2019-10-09 DIAGNOSIS — B182 Chronic viral hepatitis C: Secondary | ICD-10-CM | POA: Diagnosis not present

## 2019-10-09 DIAGNOSIS — R1013 Epigastric pain: Secondary | ICD-10-CM | POA: Diagnosis not present

## 2019-10-09 DIAGNOSIS — K219 Gastro-esophageal reflux disease without esophagitis: Secondary | ICD-10-CM | POA: Diagnosis not present

## 2019-10-09 DIAGNOSIS — E119 Type 2 diabetes mellitus without complications: Secondary | ICD-10-CM | POA: Diagnosis not present

## 2019-10-09 DIAGNOSIS — R1312 Dysphagia, oropharyngeal phase: Secondary | ICD-10-CM | POA: Diagnosis not present

## 2019-10-09 DIAGNOSIS — J449 Chronic obstructive pulmonary disease, unspecified: Secondary | ICD-10-CM | POA: Diagnosis not present

## 2019-10-09 DIAGNOSIS — D649 Anemia, unspecified: Secondary | ICD-10-CM | POA: Diagnosis not present

## 2019-10-09 DIAGNOSIS — F419 Anxiety disorder, unspecified: Secondary | ICD-10-CM | POA: Diagnosis not present

## 2019-10-09 DIAGNOSIS — F339 Major depressive disorder, recurrent, unspecified: Secondary | ICD-10-CM | POA: Diagnosis not present

## 2019-10-09 DIAGNOSIS — N390 Urinary tract infection, site not specified: Secondary | ICD-10-CM | POA: Diagnosis not present

## 2019-10-09 DIAGNOSIS — I110 Hypertensive heart disease with heart failure: Secondary | ICD-10-CM | POA: Diagnosis not present

## 2019-10-09 DIAGNOSIS — R634 Abnormal weight loss: Secondary | ICD-10-CM | POA: Diagnosis not present

## 2019-10-23 DIAGNOSIS — N390 Urinary tract infection, site not specified: Secondary | ICD-10-CM | POA: Diagnosis not present

## 2019-10-23 DIAGNOSIS — M25512 Pain in left shoulder: Secondary | ICD-10-CM | POA: Diagnosis not present

## 2019-10-23 DIAGNOSIS — G894 Chronic pain syndrome: Secondary | ICD-10-CM | POA: Diagnosis not present

## 2019-10-23 DIAGNOSIS — J309 Allergic rhinitis, unspecified: Secondary | ICD-10-CM | POA: Diagnosis not present

## 2019-11-03 DIAGNOSIS — J441 Chronic obstructive pulmonary disease with (acute) exacerbation: Secondary | ICD-10-CM | POA: Diagnosis not present

## 2019-11-03 DIAGNOSIS — F339 Major depressive disorder, recurrent, unspecified: Secondary | ICD-10-CM | POA: Diagnosis not present

## 2019-11-11 DIAGNOSIS — G894 Chronic pain syndrome: Secondary | ICD-10-CM | POA: Diagnosis not present

## 2019-11-11 DIAGNOSIS — R400 Somnolence: Secondary | ICD-10-CM | POA: Diagnosis not present

## 2019-11-11 DIAGNOSIS — G4700 Insomnia, unspecified: Secondary | ICD-10-CM | POA: Diagnosis not present

## 2019-11-16 DIAGNOSIS — G4700 Insomnia, unspecified: Secondary | ICD-10-CM | POA: Diagnosis not present

## 2019-11-16 DIAGNOSIS — F419 Anxiety disorder, unspecified: Secondary | ICD-10-CM | POA: Diagnosis not present

## 2019-11-16 DIAGNOSIS — F330 Major depressive disorder, recurrent, mild: Secondary | ICD-10-CM | POA: Diagnosis not present

## 2019-11-16 DIAGNOSIS — F17218 Nicotine dependence, cigarettes, with other nicotine-induced disorders: Secondary | ICD-10-CM | POA: Diagnosis not present

## 2019-11-18 DIAGNOSIS — R319 Hematuria, unspecified: Secondary | ICD-10-CM | POA: Diagnosis not present

## 2019-11-18 DIAGNOSIS — N343 Urethral syndrome, unspecified: Secondary | ICD-10-CM | POA: Diagnosis not present

## 2019-11-18 DIAGNOSIS — G894 Chronic pain syndrome: Secondary | ICD-10-CM | POA: Diagnosis not present

## 2019-11-18 DIAGNOSIS — G4700 Insomnia, unspecified: Secondary | ICD-10-CM | POA: Diagnosis not present

## 2019-11-22 DIAGNOSIS — R262 Difficulty in walking, not elsewhere classified: Secondary | ICD-10-CM | POA: Diagnosis not present

## 2019-11-22 DIAGNOSIS — I509 Heart failure, unspecified: Secondary | ICD-10-CM | POA: Diagnosis not present

## 2019-11-22 DIAGNOSIS — M6281 Muscle weakness (generalized): Secondary | ICD-10-CM | POA: Diagnosis not present

## 2019-11-22 DIAGNOSIS — R1312 Dysphagia, oropharyngeal phase: Secondary | ICD-10-CM | POA: Diagnosis not present

## 2019-11-25 ENCOUNTER — Ambulatory Visit (INDEPENDENT_AMBULATORY_CARE_PROVIDER_SITE_OTHER): Payer: Medicare Other | Admitting: Gastroenterology

## 2019-11-25 ENCOUNTER — Encounter: Payer: Self-pay | Admitting: Gastroenterology

## 2019-11-25 ENCOUNTER — Other Ambulatory Visit: Payer: Self-pay

## 2019-11-25 VITALS — BP 149/88 | HR 74 | Temp 96.6°F | Ht 62.0 in | Wt 210.0 lb

## 2019-11-25 DIAGNOSIS — R634 Abnormal weight loss: Secondary | ICD-10-CM

## 2019-11-25 DIAGNOSIS — R1013 Epigastric pain: Secondary | ICD-10-CM

## 2019-11-25 DIAGNOSIS — B182 Chronic viral hepatitis C: Secondary | ICD-10-CM

## 2019-11-25 DIAGNOSIS — R112 Nausea with vomiting, unspecified: Secondary | ICD-10-CM

## 2019-11-25 NOTE — Assessment & Plan Note (Signed)
Daily nausea with intermittent vomiting in the setting of poorly managed GERD (PPI helded in the setting of Epclusa) plus or minus known gastroparesis.  Increase Zofran to 4 mg 30 to 60 minutes before breakfast, lunch, dinner and dose at bedtime.

## 2019-11-25 NOTE — Telephone Encounter (Signed)
Pt was seen in office today. I called the [facility] and pt has received her Hep C medication but the nurse wasn't sure what dose of medication pt was on. I called [facility] and pts first delivery was sent on 09/02/2019. They couldn't reach pt again until Feburary and that shipment was delivered on 10/28/2019. [facility] tried to reach the [facility] yesterday. The [facility] was asked to call [facility] to schedule delivery. LSL, is aware of this.

## 2019-11-25 NOTE — Progress Notes (Signed)
Primary Care Physician: Audree Bane, DO  Primary Gastroenterologist:  Roetta Sessions, MD   Chief Complaint  Patient presents with  . Abdominal Pain    mid upper abd  . Nausea    vomiting    HPI: Gloria Chapman is a 64 y.o. female here for follow-up.  Last seen in September 2020.  She has a history of nausea, GERD, hepatitis C, constipation.EGD in 2019 demonstrated Schatzki ring with reflux esophagitis status post Maloney dilation.  She has refused colonoscopies but previously agreeable to Cologuard testing but we have not been successful getting this done through the nursing home.  According to nursing staff at time of her last virtual visit, patient sent specimen off for Cologuard but was indeterminate and the facility provider told them not repeated.  When I spoke to the patient last in April she declined Cologuard.  Due to history of noncompliance, patient's hepatitis C has been untreated.  Patient has a history of hepatitis C, genotype 1b with viral load of 13,400,000 in January 2020.  FibroSure test with fibrosis score of F0.  We submitted paperwork to start Epclusa therapy but the specialty pharmacy was having trouble getting the nursing home to respond to phone calls.  We touched base with nursing home today while patient in the office. They report that she is on her second bottle.  She never had any of her 4-week follow-up labs.  According to the [facility] the first shipment of Epclusa when out on December 16, second shipment on February 10, we are currently trying to reach out to [facility] to make arrangements for third for treatment but have been unsuccessful thus far.  Patient reports that her reflux has been really bad also pantoprazole.  This was stopped when she started [place], she is currently on Pepcid.  Waking up every morning epigastric pain and nausea.  She gets Zofran 4 mg twice daily only.  States it does not control her nausea throughout the day.  She does have  intermittent vomiting.  Denies dysphagia.  No hematemesis.  Had Covid a couple months ago, states all of her GI symptoms are worse since then.  Complains of blood in the urine.  States she was treated for UTI previously.  Currently no dysuria.  She urinates a lot.  Bowel movements are loose, twice daily.  No melena or rectal bleeding.  Weight has been stable the past 6 months but overall weight down 10 to 13 pounds in the past year.  Initially patient was requesting admission to the hospital for her abdominal pain and nausea.  She wants her hiatal hernia fixed.  On EGD in 2019 she had a small hiatal hernia.  After explaining the patient change in medication for her reflux is likely contributing to her increased symptoms, she seemed to settle down.  Gastric emptying study in October 2020 was abnormal with delayed gastric emptying.  Advised 5-6 small meals daily continue Zofran as needed, avoid Reglan unless necessary for significant vomiting due to risk for tardive dyskinesia and her polypharmacy.    Current Outpatient Medications  Medication Sig Dispense Refill  . acetaminophen (TYLENOL) 325 MG tablet Take 650 mg by mouth every 6 (six) hours as needed for mild pain.    Marland Kitchen albuterol (VENTOLIN HFA) 108 (90 Base) MCG/ACT inhaler Inhale 2 puffs into the lungs every 8 (eight) hours as needed for wheezing or shortness of breath.    Marland Kitchen ammonium lactate (AMLACTIN) 12 % cream Apply topically as  needed for dry skin.    Marland Kitchen aspirin 325 MG tablet Take 325 mg by mouth daily.    . AZO-CRANBERRY PO Take 1 tablet by mouth every 12 (twelve) hours.     . Calcium Carbonate-Vitamin D3 (CALCIUM 600-D) 600-400 MG-UNIT TABS Take 1 tablet by mouth daily.    . cholecalciferol (VITAMIN D3) 25 MCG (1000 UNIT) tablet Take 1,000 Units by mouth daily.    . diclofenac Sodium (VOLTAREN) 1 % GEL Apply 4 g topically 2 (two) times daily.    . diphenhydrAMINE (BENADRYL) 25 mg capsule Take 25 mg by mouth every 6 (six) hours as needed.       . DULoxetine (CYMBALTA) 60 MG capsule Take 60 mg by mouth daily.    . famotidine (PEPCID) 40 MG tablet Take 40 mg by mouth 2 (two) times daily.    . fluticasone furoate-vilanterol (BREO ELLIPTA) 100-25 MCG/INH AEPB Inhale 1 puff into the lungs daily.    . furosemide (LASIX) 40 MG tablet Take 40 mg by mouth daily.    Marland Kitchen GLUCAGON HCL IJ Inject as directed.    Marland Kitchen HYDROmorphone (DILAUDID) 4 MG tablet Take 4 mg by mouth every 12 (twelve) hours as needed for severe pain.     . hydrOXYzine (ATARAX/VISTARIL) 25 MG tablet Take 25 mg by mouth 4 (four) times daily.    . insulin glargine (LANTUS) 100 UNIT/ML injection Inject 28 Units into the skin daily.     . insulin lispro (HUMALOG KWIKPEN) 100 UNIT/ML KwikPen Inject into the skin. Inject as per sliding scale    . loratadine (CLARITIN) 10 MG tablet Take 10 mg by mouth daily.    Marland Kitchen LORazepam (ATIVAN) 1 MG tablet Take 1 mg by mouth 3 (three) times daily.     . Melatonin 5 MG TABS Take 2 tablets by mouth at bedtime.    . mirtazapine (REMERON) 7.5 MG tablet Take 7.5 mg by mouth at bedtime.    Marland Kitchen morphine (MS CONTIN) 15 MG 12 hr tablet Take 15 mg by mouth every 8 (eight) hours.    Marland Kitchen morphine (MS CONTIN) 30 MG 12 hr tablet Take 30 mg by mouth every 8 (eight) hours.     . naloxone (NARCAN) nasal spray 4 mg/0.1 mL Place 1 spray into the nose.    . ondansetron (ZOFRAN) 4 MG tablet Take 4 mg by mouth every 12 (twelve) hours as needed for nausea or vomiting.    Marland Kitchen oxybutynin (DITROPAN-XL) 5 MG 24 hr tablet Take 10 mg by mouth daily.     . polyethylene glycol (MIRALAX / GLYCOLAX) packet Take 17 g by mouth daily as needed.    . Polyethylene Glycol 400 (BLINK TEARS) 0.25 % SOLN Apply to eye every 8 (eight) hours.    . potassium chloride (KLOR-CON SPRINKLE) 10 MEQ CR capsule Take 10 mEq by mouth daily.     Marland Kitchen senna (SENOKOT) 8.6 MG TABS tablet Take 1 tablet by mouth every 12 (twelve) hours.    . Sofosbuvir-Velpatasvir (EPCLUSA) 400-100 MG TABS Take 1 tablet by mouth daily.     . traZODone (DESYREL) 100 MG tablet Take 300 mg by mouth at bedtime.     Marland Kitchen ipratropium-albuterol (DUONEB) 0.5-2.5 (3) MG/3ML SOLN     . levocetirizine (XYZAL) 5 MG tablet Take 5 mg by mouth every evening.     No current facility-administered medications for this visit.    Allergies as of 11/25/2019  . (No Known Allergies)   Past Medical History:  Diagnosis Date  .  Anemia   . Anxiety   . Arthritis   . COPD (chronic obstructive pulmonary disease) (HCC)   . Depression   . Dyspnea   . Fibromyalgia   . GERD (gastroesophageal reflux disease)   . Headache   . Heart failure (HCC)   . Heart murmur   . Hepatitis   . History of Clostridium difficile infection   . History of methicillin resistant staphylococcus aureus (MRSA)   . Insomnia   . Overactive bladder   . Pneumonia    Past Surgical History:  Procedure Laterality Date  . APPENDECTOMY    . BACK SURGERY     twice  . DIAGNOSTIC LAPAROSCOPY     checking for endometriosis  . ESOPHAGOGASTRODUODENOSCOPY (EGD) WITH PROPOFOL N/A 11/21/2017   Dr. Jena Gauss: Erosive reflux esophagitis, low-grade narrowing Schatzki ring status post dilation, small hiatal hernia.  Marland Kitchen FEMUR FRACTURE SURGERY Right    Rods placed  . HEMORRHOID SURGERY    . INNER EAR SURGERY Left   . MALONEY DILATION N/A 11/21/2017   Procedure: Elease Hashimoto DILATION;  Surgeon: Corbin Ade, MD;  Location: [facility];  Service: Endoscopy;  Laterality: N/A;  . NASAL SINUS SURGERY    . right hip surgery     [place]: "took my hip out due to MRSA" occurred after hip replacements    ROS:  General: Negative for anorexia,   fever, chills, fatigue, weakness. See hpi ENT: Negative for hoarseness, difficulty swallowing , nasal congestion. CV: Negative for chest pain, angina, palpitations, dyspnea on exertion, peripheral edema.  Respiratory: Negative for dyspnea at rest, dyspnea on exertion, cough, sputum, wheezing.  GI: See history of present illness. GU:  Negative for dysuria,    urinary incontinence, urinary frequency, nocturnal urination. See hpi Endo: see hpi   Physical Examination:   BP (!) 149/88   Pulse 74   Temp (!) 96.6 F (35.9 C) (Temporal)   Ht 5\' 2"  (1.575 m)   Wt 210 lb (95.3 kg) Comment: stated by patient  BMI 38.41 kg/m   General: Well-nourished, well-developed in no acute distress.  Eyes: No icterus. Mouth: masked Lungs: Clear to auscultation bilaterally.  Heart: Regular rate and rhythm, no murmurs rubs or gallops.  Abdomen: Bowel sounds are normal, moderate epig tenderness, nondistended, no hepatosplenomegaly or masses, no abdominal bruits or hernia , no rebound or guarding.   Extremities: No lower extremity edema. No clubbing or deformities. Neuro: Alert and oriented x 4   Skin: Warm and dry, no jaundice.   Psych: Alert and cooperative, normal mood and affect.   Imaging Studies: No results found.

## 2019-11-25 NOTE — Assessment & Plan Note (Addendum)
Complains of 2 month history significant epigastric pain, noted weight loss, intermittent vomiting, frequent nausea.  Also with chronic hep C and patient reported gross hematuria.  Plan for CT abdomen and pelvis to further evaluate.  Obtain labs as well.

## 2019-11-25 NOTE — Assessment & Plan Note (Signed)
Chronic hepatitis C, genotype 1b, without evidence of cirrhosis.  Currently on Epclusa.  It is unclear whether her first and second months were interrupted as her shipment dates were in December and February.  She may have had delay in starting therapy due to Covid infection.  At this point we will check HCVRNA and plan to complete full 3 months of Epclusa.

## 2019-11-25 NOTE — Patient Instructions (Addendum)
1. CT Abd/pelvis for further evaluation of epigastric pain, nausea/vomiting, weight loss, chronic hepatitis C, blood in urine. 2. Update labs for abdominal pain and hepatitis C. 3. Increase zofran to 4mg  30-60 minutes before breakfast, lunch, dinner, and at bedtime.  4. Please call [facility] to arrange for third shipment of Epclusa, there number is [phone]. CALL TODAY SO THERE IS NO DELAY IN COMPLETING EPCLUSA TREATMENT.

## 2019-11-25 NOTE — Progress Notes (Signed)
Opened in error

## 2019-11-26 ENCOUNTER — Encounter: Payer: Self-pay | Admitting: *Deleted

## 2019-11-26 NOTE — Progress Notes (Signed)
Called nursing facility. Was advised to fax appt information to [phone] attn kelly. This has been faxed

## 2019-12-01 DIAGNOSIS — H2513 Age-related nuclear cataract, bilateral: Secondary | ICD-10-CM | POA: Diagnosis not present

## 2019-12-01 DIAGNOSIS — Z794 Long term (current) use of insulin: Secondary | ICD-10-CM | POA: Diagnosis not present

## 2019-12-01 DIAGNOSIS — H04123 Dry eye syndrome of bilateral lacrimal glands: Secondary | ICD-10-CM | POA: Diagnosis not present

## 2019-12-01 DIAGNOSIS — E119 Type 2 diabetes mellitus without complications: Secondary | ICD-10-CM | POA: Diagnosis not present

## 2019-12-02 DIAGNOSIS — J441 Chronic obstructive pulmonary disease with (acute) exacerbation: Secondary | ICD-10-CM | POA: Diagnosis not present

## 2019-12-02 DIAGNOSIS — F329 Major depressive disorder, single episode, unspecified: Secondary | ICD-10-CM | POA: Diagnosis not present

## 2019-12-02 DIAGNOSIS — F339 Major depressive disorder, recurrent, unspecified: Secondary | ICD-10-CM | POA: Diagnosis not present

## 2019-12-07 DIAGNOSIS — J0100 Acute maxillary sinusitis, unspecified: Secondary | ICD-10-CM | POA: Diagnosis not present

## 2019-12-07 DIAGNOSIS — H6123 Impacted cerumen, bilateral: Secondary | ICD-10-CM | POA: Diagnosis not present

## 2019-12-07 DIAGNOSIS — H9202 Otalgia, left ear: Secondary | ICD-10-CM | POA: Diagnosis not present

## 2019-12-07 DIAGNOSIS — M26622 Arthralgia of left temporomandibular joint: Secondary | ICD-10-CM | POA: Diagnosis not present

## 2019-12-07 DIAGNOSIS — H6983 Other specified disorders of Eustachian tube, bilateral: Secondary | ICD-10-CM | POA: Diagnosis not present

## 2019-12-07 DIAGNOSIS — H6593 Unspecified nonsuppurative otitis media, bilateral: Secondary | ICD-10-CM | POA: Diagnosis not present

## 2019-12-09 NOTE — Telephone Encounter (Signed)
FYI Received a call from [facility]. They have been trying to reach pt at the facility to schedule the last delivery. [facility] is aware that I'm going to call the facility. I spoke with the the receptionist and nurse. They were given the number to contact [facility] [phone]. They are aware how important it is that pt finishes her medication.

## 2019-12-10 ENCOUNTER — Ambulatory Visit (HOSPITAL_COMMUNITY)
Admission: RE | Admit: 2019-12-10 | Discharge: 2019-12-10 | Disposition: A | Discharge status: Home / Self Care | Payer: Medicare Other | Source: Ambulatory Visit | Attending: Gastroenterology | Admitting: Gastroenterology

## 2019-12-10 ENCOUNTER — Other Ambulatory Visit: Payer: Self-pay

## 2019-12-10 DIAGNOSIS — R112 Nausea with vomiting, unspecified: Secondary | ICD-10-CM | POA: Insufficient documentation

## 2019-12-10 DIAGNOSIS — B182 Chronic viral hepatitis C: Secondary | ICD-10-CM | POA: Diagnosis not present

## 2019-12-10 DIAGNOSIS — R1013 Epigastric pain: Secondary | ICD-10-CM | POA: Insufficient documentation

## 2019-12-10 DIAGNOSIS — R634 Abnormal weight loss: Secondary | ICD-10-CM | POA: Insufficient documentation

## 2019-12-10 DIAGNOSIS — R1110 Vomiting, unspecified: Secondary | ICD-10-CM | POA: Diagnosis not present

## 2019-12-10 MED ORDER — IOHEXOL 300 MG/ML  SOLN
100.0000 mL | Freq: Once | INTRAMUSCULAR | Status: AC | PRN
  Administered 2019-12-10: 100 mL via INTRAVENOUS

## 2019-12-14 NOTE — Telephone Encounter (Signed)
Attempted calling nursing facility. The phone continued to ring. Not able to leave a VM. Will call back.

## 2019-12-14 NOTE — Telephone Encounter (Signed)
Need labs ordered at time of OV.

## 2019-12-15 NOTE — Telephone Encounter (Signed)
Called home several times with no answer. Will call back.

## 2019-12-16 NOTE — Telephone Encounter (Signed)
Called the [facility] and was placed on hold for several mins. Will call home back.

## 2019-12-17 NOTE — Telephone Encounter (Signed)
LSL, I was able to get the [facility]. They are faxing labs over now.  Will place in office when received.

## 2019-12-21 DIAGNOSIS — H6593 Unspecified nonsuppurative otitis media, bilateral: Secondary | ICD-10-CM | POA: Diagnosis not present

## 2019-12-22 ENCOUNTER — Telehealth: Payer: Self-pay | Admitting: Gastroenterology

## 2019-12-22 NOTE — Telephone Encounter (Signed)
Noted. Called nursing facility 3 times and the number was busy. Will call back.

## 2019-12-22 NOTE — Telephone Encounter (Signed)
See telephone note dated 12/22/19.

## 2019-12-22 NOTE — Telephone Encounter (Signed)
Called nursing facility with no answer, no VM picked up. Will call facility back.

## 2019-12-22 NOTE — Telephone Encounter (Signed)
Labs dated December 01, 2019 done at the nursing home.  I specifically ordered a CBC, c-Met, lipase and HCVRNA to determine whether she has responded to [facility] for her chronic hep C.  Received labs for acute hepatitis panel instead of HCVRNA.  Platelet cell count 10,500, hemoglobin 15.6, platelets 194,000, BUN 11.8, creatinine 0.45, total bilirubin 0.2, albumin 4.1, AST less than 5, ALT 5, alk phos 78, A1c 10.6       NEED THEM TO COLLECT AN HCV RNA quantitative ASAP! If they can't order appropriate test at nursing home, then she needs to be taken to the lab we ordered it at [facility]

## 2019-12-23 ENCOUNTER — Telehealth: Payer: Self-pay | Admitting: Internal Medicine

## 2019-12-23 NOTE — Telephone Encounter (Signed)
Annice Pih from the [facility] in [place] called to say patient was out of Epclusa and didn't know if we were to order the refill or if it was something they needed to do. Please advise and call Annice Pih at [phone]

## 2019-12-23 NOTE — Telephone Encounter (Signed)
Called facility x 2 and number was busy. Will call back.

## 2019-12-24 NOTE — Telephone Encounter (Signed)
Tried to call back.  Busy signal x 3.

## 2019-12-25 DIAGNOSIS — R3914 Feeling of incomplete bladder emptying: Secondary | ICD-10-CM | POA: Diagnosis not present

## 2019-12-25 DIAGNOSIS — R82998 Other abnormal findings in urine: Secondary | ICD-10-CM | POA: Diagnosis not present

## 2019-12-25 DIAGNOSIS — R310 Gross hematuria: Secondary | ICD-10-CM | POA: Diagnosis not present

## 2019-12-25 DIAGNOSIS — R102 Pelvic and perineal pain: Secondary | ICD-10-CM | POA: Diagnosis not present

## 2019-12-25 DIAGNOSIS — R3129 Other microscopic hematuria: Secondary | ICD-10-CM | POA: Diagnosis not present

## 2019-12-26 DIAGNOSIS — R1312 Dysphagia, oropharyngeal phase: Secondary | ICD-10-CM | POA: Diagnosis not present

## 2019-12-26 DIAGNOSIS — R262 Difficulty in walking, not elsewhere classified: Secondary | ICD-10-CM | POA: Diagnosis not present

## 2019-12-26 DIAGNOSIS — M6281 Muscle weakness (generalized): Secondary | ICD-10-CM | POA: Diagnosis not present

## 2019-12-26 DIAGNOSIS — N3281 Overactive bladder: Secondary | ICD-10-CM | POA: Diagnosis not present

## 2019-12-27 DIAGNOSIS — T8451XA Infection and inflammatory reaction due to internal right hip prosthesis, initial encounter: Secondary | ICD-10-CM | POA: Diagnosis not present

## 2019-12-28 NOTE — Telephone Encounter (Signed)
I spoke with Rosey Bath, Interior and spatial designer of nursing for [facility] in [place].  Routing to [facility]

## 2019-12-28 NOTE — Telephone Encounter (Signed)
Routing to [employer]

## 2019-12-28 NOTE — Telephone Encounter (Signed)
Please advise. Results need to be given for pt. I've tried several times to contact this nursing facility. Please advise. Pt has been on Hep C meds.

## 2019-12-28 NOTE — Telephone Encounter (Signed)
Spoke with Rosey Bath. She was given the info that LSL needs from pt. Pt needs a HCV RNA quant drawn ASAP. Rosey Bath will fax results to our office when completed.

## 2019-12-29 DIAGNOSIS — Z1159 Encounter for screening for other viral diseases: Secondary | ICD-10-CM | POA: Diagnosis not present

## 2019-12-31 DIAGNOSIS — J309 Allergic rhinitis, unspecified: Secondary | ICD-10-CM | POA: Diagnosis not present

## 2019-12-31 DIAGNOSIS — I509 Heart failure, unspecified: Secondary | ICD-10-CM | POA: Diagnosis not present

## 2019-12-31 DIAGNOSIS — R262 Difficulty in walking, not elsewhere classified: Secondary | ICD-10-CM | POA: Diagnosis not present

## 2019-12-31 DIAGNOSIS — M6281 Muscle weakness (generalized): Secondary | ICD-10-CM | POA: Diagnosis not present

## 2020-01-01 DIAGNOSIS — J441 Chronic obstructive pulmonary disease with (acute) exacerbation: Secondary | ICD-10-CM | POA: Diagnosis not present

## 2020-01-01 DIAGNOSIS — I509 Heart failure, unspecified: Secondary | ICD-10-CM | POA: Diagnosis not present

## 2020-01-01 DIAGNOSIS — F339 Major depressive disorder, recurrent, unspecified: Secondary | ICD-10-CM | POA: Diagnosis not present

## 2020-01-01 DIAGNOSIS — F329 Major depressive disorder, single episode, unspecified: Secondary | ICD-10-CM | POA: Diagnosis not present

## 2020-01-13 DIAGNOSIS — N390 Urinary tract infection, site not specified: Secondary | ICD-10-CM | POA: Diagnosis not present

## 2020-01-13 DIAGNOSIS — R319 Hematuria, unspecified: Secondary | ICD-10-CM | POA: Diagnosis not present

## 2020-01-13 DIAGNOSIS — G894 Chronic pain syndrome: Secondary | ICD-10-CM | POA: Diagnosis not present

## 2020-01-13 DIAGNOSIS — K219 Gastro-esophageal reflux disease without esophagitis: Secondary | ICD-10-CM | POA: Diagnosis not present

## 2020-01-13 DIAGNOSIS — N343 Urethral syndrome, unspecified: Secondary | ICD-10-CM | POA: Diagnosis not present

## 2020-01-13 DIAGNOSIS — Z79899 Other long term (current) drug therapy: Secondary | ICD-10-CM | POA: Diagnosis not present

## 2020-01-14 ENCOUNTER — Telehealth: Payer: Self-pay | Admitting: Emergency Medicine

## 2020-01-15 NOTE — Telephone Encounter (Signed)
error 

## 2020-01-18 ENCOUNTER — Telehealth: Payer: Self-pay

## 2020-01-18 NOTE — Telephone Encounter (Signed)
Spoke with Annice Pih from the [facility]. Pt is having some abdominal pain (upper)/sharp pain where her hiatal hernia is. Pt has c/o nausea. Pt is taking Zofran 1 pill in the morning and at bedtime, Pepcid 40 mg. Pt also takes Mylanta as needed and Epclusa. Annice Pih was asked is this the pts 3rd month on Epclusa and I was told yes. This should be the last month of pt taking Epcluda. Annice Pih states pt says she needs an EGD ASAP. Per Annice Pih, she has asked pt to cut back on the Dr. Reino Kent drinks.

## 2020-01-19 NOTE — Telephone Encounter (Signed)
Received labs dated 12/30/2019:  HCVRNA less than 15.  1. Patient needs to complete Epclusa (total of 12 weeks).  2. Repeat HCVRNA and LFTs in four months.  3. See other telephone instructions (dated 01/19/20).

## 2020-01-19 NOTE — Telephone Encounter (Signed)
Called home. Will call back. Wasn't able to get anyone.

## 2020-01-19 NOTE — Telephone Encounter (Signed)
Attempted calling Gloria Chapman. The phone rung without a VM. Will call back.

## 2020-01-19 NOTE — Telephone Encounter (Signed)
Please find out how many days of Epclusa she has left. When I saw her on 3/10th they were making arrangements for her 3rd bottle. That was almost two months ago.   I also wrote for her to have increased zofran at time of last ov. So unclear why the order did not happen.  At this point, Add Carafate 1 gram suspension before breakfast, lunch, dinner, and at bedtime for 2 weeks only.  When she completes Epclusa, we will add back PPI which should help.

## 2020-01-20 NOTE — Telephone Encounter (Signed)
Called nursing facility, number was busy x 3. Will call back.

## 2020-01-26 DIAGNOSIS — Z1159 Encounter for screening for other viral diseases: Secondary | ICD-10-CM | POA: Diagnosis not present

## 2020-01-26 DIAGNOSIS — R311 Benign essential microscopic hematuria: Secondary | ICD-10-CM | POA: Diagnosis not present

## 2020-01-27 NOTE — Telephone Encounter (Signed)
FYI Todd called from [facility] this morning. [facility] records indicate that pt needs another round of Epclusa. I called to speak with Annice Pih and today she said pt is out of her medication and the nurses tried to order it through another pharmacy. Annice Pih the manager said she was going to contact our office to let us know that pt was out of her medication and has only had two treatments of Epclusa. Per our last conversation, I was told that pt was on her third bottle of Epclusa. Annice Pih is aware that Todd from [facility] has tried to reach them several times and is going to contact her specifically.   Annice Pih was also updated on the call on Jan 19, 2020. I'd tried call several times and wasn't able to get anyone.   Spoke with Tawanna Cooler and he was updated on pt and will call to arrange shipment with Annice Pih. Tawanna Cooler will call back if he can't get with pt.

## 2020-01-28 DIAGNOSIS — I739 Peripheral vascular disease, unspecified: Secondary | ICD-10-CM | POA: Diagnosis not present

## 2020-01-28 DIAGNOSIS — I509 Heart failure, unspecified: Secondary | ICD-10-CM | POA: Diagnosis not present

## 2020-01-28 DIAGNOSIS — R7989 Other specified abnormal findings of blood chemistry: Secondary | ICD-10-CM | POA: Diagnosis not present

## 2020-01-28 DIAGNOSIS — F339 Major depressive disorder, recurrent, unspecified: Secondary | ICD-10-CM | POA: Diagnosis not present

## 2020-01-28 DIAGNOSIS — M6281 Muscle weakness (generalized): Secondary | ICD-10-CM | POA: Diagnosis not present

## 2020-02-05 DIAGNOSIS — K760 Fatty (change of) liver, not elsewhere classified: Secondary | ICD-10-CM | POA: Diagnosis not present

## 2020-02-05 DIAGNOSIS — R310 Gross hematuria: Secondary | ICD-10-CM | POA: Diagnosis not present

## 2020-02-06 DIAGNOSIS — D519 Vitamin B12 deficiency anemia, unspecified: Secondary | ICD-10-CM | POA: Diagnosis not present

## 2020-02-06 DIAGNOSIS — R69 Illness, unspecified: Secondary | ICD-10-CM | POA: Diagnosis not present

## 2020-02-06 DIAGNOSIS — D649 Anemia, unspecified: Secondary | ICD-10-CM | POA: Diagnosis not present

## 2020-02-11 NOTE — Telephone Encounter (Signed)
Noted. Will call the [facility] closer to 2 weeks.

## 2020-02-11 NOTE — Telephone Encounter (Signed)
Patient needs labs in two weeks.  HCV RNA CMET

## 2020-02-25 DIAGNOSIS — M6281 Muscle weakness (generalized): Secondary | ICD-10-CM | POA: Diagnosis not present

## 2020-02-25 DIAGNOSIS — J309 Allergic rhinitis, unspecified: Secondary | ICD-10-CM | POA: Diagnosis not present

## 2020-02-25 DIAGNOSIS — I509 Heart failure, unspecified: Secondary | ICD-10-CM | POA: Diagnosis not present

## 2020-02-25 DIAGNOSIS — R262 Difficulty in walking, not elsewhere classified: Secondary | ICD-10-CM | POA: Diagnosis not present

## 2020-03-01 DIAGNOSIS — F339 Major depressive disorder, recurrent, unspecified: Secondary | ICD-10-CM | POA: Diagnosis not present

## 2020-03-01 DIAGNOSIS — J449 Chronic obstructive pulmonary disease, unspecified: Secondary | ICD-10-CM | POA: Diagnosis not present

## 2020-03-01 DIAGNOSIS — F329 Major depressive disorder, single episode, unspecified: Secondary | ICD-10-CM | POA: Diagnosis not present

## 2020-03-01 DIAGNOSIS — I509 Heart failure, unspecified: Secondary | ICD-10-CM | POA: Diagnosis not present

## 2020-03-01 DIAGNOSIS — J441 Chronic obstructive pulmonary disease with (acute) exacerbation: Secondary | ICD-10-CM | POA: Diagnosis not present

## 2020-03-01 DIAGNOSIS — E785 Hyperlipidemia, unspecified: Secondary | ICD-10-CM | POA: Diagnosis not present

## 2020-03-01 DIAGNOSIS — E559 Vitamin D deficiency, unspecified: Secondary | ICD-10-CM | POA: Diagnosis not present

## 2020-03-01 DIAGNOSIS — E1169 Type 2 diabetes mellitus with other specified complication: Secondary | ICD-10-CM | POA: Diagnosis not present

## 2020-03-09 NOTE — Telephone Encounter (Signed)
Spoke with Antonette at the [facility]. Lab orders HCV RNA and CMET will be drawn and faxed to our office when resulted. Orders were faxed to the [facility] [phone].

## 2020-03-19 DIAGNOSIS — E559 Vitamin D deficiency, unspecified: Secondary | ICD-10-CM | POA: Diagnosis not present

## 2020-03-19 DIAGNOSIS — E785 Hyperlipidemia, unspecified: Secondary | ICD-10-CM | POA: Diagnosis not present

## 2020-03-19 DIAGNOSIS — R262 Difficulty in walking, not elsewhere classified: Secondary | ICD-10-CM | POA: Diagnosis not present

## 2020-03-19 DIAGNOSIS — M6281 Muscle weakness (generalized): Secondary | ICD-10-CM | POA: Diagnosis not present

## 2020-03-27 IMAGING — NM NM GASTRIC EMPTYING
10 series · 10 of 10 positions shown · non-contrast
Comparison: None

CLINICAL DATA: Abdominal pain, nausea and reflux for more than 1
year, took Zofran the morning of this exam

EXAM:
NUCLEAR MEDICINE GASTRIC EMPTYING SCAN
TECHNIQUE: After oral ingestion of radiolabeled meal, sequential abdominal
images were obtained for 4 hours. Percentage of activity emptying
the stomach was calculated at 1 hour, 2 hour, 3 hour, and 4 hours.
RADIOPHARMACEUTICALS:  2.1 mCi Ac-66m sulfur colloid in standardized
meal

[Series 1: 0 min · 4.14mm/px · 1 of 1 slices shown (1 of 2)]
[im 1/1]
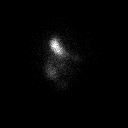

[Series 1: 0 min · 4.14mm/px · 1 of 1 slices shown (2 of 2)]
[im 1/1]
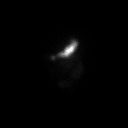

[Series 2: 60 min · 4.14mm/px · 1 of 1 slices shown (1 of 2)]
[im 1/1]
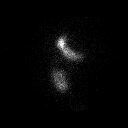

[Series 2: 60 min · 4.14mm/px · 1 of 1 slices shown (2 of 2)]
[im 1/1]
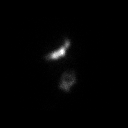

[Series 3: 120 min · 4.14mm/px · 1 of 1 slices shown (1 of 2)]
[im 1/1  full-range]
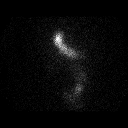

[Series 3: 120 min · 4.14mm/px · 1 of 1 slices shown (2 of 2)]
[im 1/1]
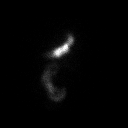

[Series 4: 180 min · 4.14mm/px · 1 of 1 slices shown (1 of 2)]
[im 1/1  full-range]
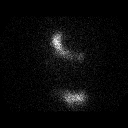

[Series 4: 180 min · 4.14mm/px · 1 of 1 slices shown (2 of 2)]
[im 1/1]
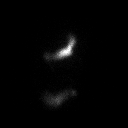

[Series 5: 240 min · 4.14mm/px · 1 of 1 slices shown (1 of 2)]
[im 1/1]
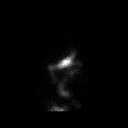

[Series 5: 240 min · 4.14mm/px · 1 of 1 slices shown (2 of 2)]
[im 1/1]
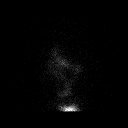

[10 of 10 positions shown; findings below may reference images not displayed]

FINDINGS: Expected location of the stomach in the left upper quadrant.

Ingested meal empties the stomach slowly and incompletely over the
course of the study.

22% emptied at 1 hr ( normal >= 10%)

27% emptied at 2 hr ( normal >= 40%)

32% emptied at 3 hr ( normal >= 70%)

55% emptied at 4 hr ( normal >= 90%)
IMPRESSION: Abnormal delayed gastric emptying.

## 2020-03-30 DIAGNOSIS — F339 Major depressive disorder, recurrent, unspecified: Secondary | ICD-10-CM | POA: Diagnosis not present

## 2020-03-30 DIAGNOSIS — F329 Major depressive disorder, single episode, unspecified: Secondary | ICD-10-CM | POA: Diagnosis not present

## 2020-03-30 DIAGNOSIS — J441 Chronic obstructive pulmonary disease with (acute) exacerbation: Secondary | ICD-10-CM | POA: Diagnosis not present

## 2020-03-30 DIAGNOSIS — E559 Vitamin D deficiency, unspecified: Secondary | ICD-10-CM | POA: Diagnosis not present

## 2020-03-30 DIAGNOSIS — E785 Hyperlipidemia, unspecified: Secondary | ICD-10-CM | POA: Diagnosis not present

## 2020-03-30 DIAGNOSIS — J449 Chronic obstructive pulmonary disease, unspecified: Secondary | ICD-10-CM | POA: Diagnosis not present

## 2020-03-30 DIAGNOSIS — E1169 Type 2 diabetes mellitus with other specified complication: Secondary | ICD-10-CM | POA: Diagnosis not present

## 2020-03-30 DIAGNOSIS — I509 Heart failure, unspecified: Secondary | ICD-10-CM | POA: Diagnosis not present

## 2020-04-04 DIAGNOSIS — F17200 Nicotine dependence, unspecified, uncomplicated: Secondary | ICD-10-CM | POA: Diagnosis not present

## 2020-04-04 DIAGNOSIS — B182 Chronic viral hepatitis C: Secondary | ICD-10-CM | POA: Diagnosis not present

## 2020-04-04 DIAGNOSIS — E119 Type 2 diabetes mellitus without complications: Secondary | ICD-10-CM | POA: Diagnosis not present

## 2020-04-04 DIAGNOSIS — F339 Major depressive disorder, recurrent, unspecified: Secondary | ICD-10-CM | POA: Diagnosis not present

## 2020-04-04 DIAGNOSIS — D649 Anemia, unspecified: Secondary | ICD-10-CM | POA: Diagnosis not present

## 2020-04-04 DIAGNOSIS — F419 Anxiety disorder, unspecified: Secondary | ICD-10-CM | POA: Diagnosis not present

## 2020-04-11 DIAGNOSIS — R0981 Nasal congestion: Secondary | ICD-10-CM | POA: Diagnosis not present

## 2020-04-11 DIAGNOSIS — H6690 Otitis media, unspecified, unspecified ear: Secondary | ICD-10-CM | POA: Diagnosis not present

## 2020-04-11 DIAGNOSIS — H9203 Otalgia, bilateral: Secondary | ICD-10-CM | POA: Diagnosis not present

## 2020-04-19 DIAGNOSIS — R0981 Nasal congestion: Secondary | ICD-10-CM | POA: Diagnosis not present

## 2020-04-19 DIAGNOSIS — H6690 Otitis media, unspecified, unspecified ear: Secondary | ICD-10-CM | POA: Diagnosis not present

## 2020-04-21 DIAGNOSIS — B182 Chronic viral hepatitis C: Secondary | ICD-10-CM | POA: Diagnosis not present

## 2020-04-21 DIAGNOSIS — I509 Heart failure, unspecified: Secondary | ICD-10-CM | POA: Diagnosis not present

## 2020-04-21 DIAGNOSIS — J449 Chronic obstructive pulmonary disease, unspecified: Secondary | ICD-10-CM | POA: Diagnosis not present

## 2020-04-21 DIAGNOSIS — K219 Gastro-esophageal reflux disease without esophagitis: Secondary | ICD-10-CM | POA: Diagnosis not present

## 2020-04-21 DIAGNOSIS — E1169 Type 2 diabetes mellitus with other specified complication: Secondary | ICD-10-CM | POA: Diagnosis not present

## 2020-04-26 ENCOUNTER — Other Ambulatory Visit: Payer: Self-pay

## 2020-04-26 ENCOUNTER — Encounter: Payer: Self-pay | Admitting: Gastroenterology

## 2020-04-26 ENCOUNTER — Ambulatory Visit (INDEPENDENT_AMBULATORY_CARE_PROVIDER_SITE_OTHER): Payer: Medicare Other | Admitting: Gastroenterology

## 2020-04-26 VITALS — BP 119/79 | HR 60 | Temp 97.3°F | Ht 62.0 in | Wt 210.0 lb

## 2020-04-26 DIAGNOSIS — R112 Nausea with vomiting, unspecified: Secondary | ICD-10-CM

## 2020-04-26 DIAGNOSIS — K219 Gastro-esophageal reflux disease without esophagitis: Secondary | ICD-10-CM

## 2020-04-26 DIAGNOSIS — F329 Major depressive disorder, single episode, unspecified: Secondary | ICD-10-CM | POA: Diagnosis not present

## 2020-04-26 DIAGNOSIS — E1169 Type 2 diabetes mellitus with other specified complication: Secondary | ICD-10-CM | POA: Diagnosis not present

## 2020-04-26 DIAGNOSIS — J449 Chronic obstructive pulmonary disease, unspecified: Secondary | ICD-10-CM | POA: Diagnosis not present

## 2020-04-26 DIAGNOSIS — R1013 Epigastric pain: Secondary | ICD-10-CM

## 2020-04-26 DIAGNOSIS — F339 Major depressive disorder, recurrent, unspecified: Secondary | ICD-10-CM | POA: Diagnosis not present

## 2020-04-26 DIAGNOSIS — B182 Chronic viral hepatitis C: Secondary | ICD-10-CM

## 2020-04-26 DIAGNOSIS — J441 Chronic obstructive pulmonary disease with (acute) exacerbation: Secondary | ICD-10-CM | POA: Diagnosis not present

## 2020-04-26 DIAGNOSIS — E559 Vitamin D deficiency, unspecified: Secondary | ICD-10-CM | POA: Diagnosis not present

## 2020-04-26 DIAGNOSIS — E785 Hyperlipidemia, unspecified: Secondary | ICD-10-CM | POA: Diagnosis not present

## 2020-04-26 DIAGNOSIS — I509 Heart failure, unspecified: Secondary | ICD-10-CM | POA: Diagnosis not present

## 2020-04-26 MED ORDER — PANTOPRAZOLE SODIUM 40 MG PO TBEC: 40.0000 mg | DELAYED_RELEASE_TABLET | Freq: Two times a day (BID) | ORAL | 5 refills | Status: DC

## 2020-04-26 MED ORDER — PROMETHAZINE HCL 12.5 MG PO TABS: 12.5000 mg | ORAL_TABLET | Freq: Three times a day (TID) | ORAL | 0 refills | Status: DC | PRN

## 2020-04-26 NOTE — Progress Notes (Signed)
Primary Care Physician: Audree Bane, DO  Primary Gastroenterologist:  Roetta Sessions, MD   Chief Complaint  Patient presents with  . Diarrhea  . Emesis  . Gastroesophageal Reflux    HPI: Gloria Chapman is a 64 y.o. female here for follow-up.  Last seen in March 2021.  History of nausea, GERD, hep C, constipation. EGD in 2019 demonstrated Schatzki ring with reflux esophagitis status post Maloney dilation. She has refused colonoscopies but previously agreeable to Cologuard testing but we have not been successful getting this done through the nursing home. Specimen sent off for Cologuard but was indeterminate and the facility provider told themnot repeated.   History of hepatitis C, genotype 1b with viral load of 13,400,000 January 2020.  Fibrosure test with fibrosis score of F0.  She has been treated with Epclusa but compliance has been an issue. Possibly a gap between bottle 2 and 3 according to information provided by [facility].  As of Jan 27, 2019 [facility] had notified us that they had not sent the third bottle Epclusa.  Nursing home staff stated they had tried to get it through another pharmacy indicating likely lapse in therapy.  December 29, 2019: HCVRNA was negative.  Patient is frustrated today that she is not seeing the doctor.  I offered to reschedule her to be seen with Dr. Jena Gauss at a later date as he is not in the office today.  She declined.  She complains of poor appetite.  Daily nausea, Zofran not helping.  She states she has intermittent vomiting, postprandial epigastric pain, reports not been able to eat until after she takes her trazodone at night.  Having a lot of heartburn.  Management of her heartburn was difficult during Epclusa treatment because we had to stop her PPI because of potential for decreasing efficacy of Epclusa.  We had plans to add back PPI after completion of Epclusa.  Communication with the nursing home has been difficult.  Currently patient  is on Pepcid.  She is a difficult historian.  Initially tells me she is having food stick in her chest but with further questioning it does not sound like she is having true dysphagia.  She states she eats sandwiches without any problems.  No issues taking her medication.  She complains that when she uses her inhalers that she has burning in the chest.  Bowel movements occurring 1-2 times per day.  Somewhat looser stools.  No blood in the stool or melena.   Patient specifically requesting EGD and to have her esophagus dilated.     Current Outpatient Medications  Medication Sig Dispense Refill  . acetaminophen (TYLENOL) 325 MG tablet Take 650 mg by mouth every 6 (six) hours as needed for mild pain.    Marland Kitchen ammonium lactate (AMLACTIN) 12 % cream Apply topically as needed for dry skin.    Marland Kitchen aspirin 325 MG tablet Take 325 mg by mouth daily.    . AZO-CRANBERRY PO Take 1 tablet by mouth every 12 (twelve) hours.     . Calcium Carbonate-Vitamin D3 (CALCIUM 600-D) 600-400 MG-UNIT TABS Take 1 tablet by mouth daily.    . cefdinir (OMNICEF) 300 MG capsule Take 300 mg by mouth 2 (two) times daily.    . cholecalciferol (VITAMIN D3) 25 MCG (1000 UNIT) tablet Take 1,000 Units by mouth daily.    . diclofenac Sodium (VOLTAREN) 1 % GEL Apply 4 g topically 2 (two) times daily.    . diphenhydrAMINE (BENADRYL)  25 mg capsule Take 25 mg by mouth every 6 (six) hours as needed.     . DULoxetine (CYMBALTA) 60 MG capsule Take 60 mg by mouth daily.    . famotidine (PEPCID) 40 MG tablet Take 40 mg by mouth at bedtime.     . fluticasone furoate-vilanterol (BREO ELLIPTA) 100-25 MCG/INH AEPB Inhale 1 puff into the lungs daily.    . furosemide (LASIX) 40 MG tablet Take 40 mg by mouth daily.    Marland Kitchen GLUCAGON HCL IJ Inject as directed.    Marland Kitchen HYDROmorphone (DILAUDID) 4 MG tablet Take 4 mg by mouth every 12 (twelve) hours as needed for severe pain.     . hydrOXYzine (ATARAX/VISTARIL) 25 MG tablet Take 25 mg by mouth 4 (four) times daily.     . insulin glargine (LANTUS) 100 UNIT/ML injection Inject 28 Units into the skin daily.     . insulin lispro (HUMALOG KWIKPEN) 100 UNIT/ML KwikPen Inject into the skin. Inject as per sliding scale    . ipratropium-albuterol (DUONEB) 0.5-2.5 (3) MG/3ML SOLN     . levocetirizine (XYZAL) 5 MG tablet Take 5 mg by mouth every evening.    . loratadine (CLARITIN) 10 MG tablet Take 10 mg by mouth daily.    Marland Kitchen LORazepam (ATIVAN) 1 MG tablet Take 1 mg by mouth 3 (three) times daily.     . Melatonin 5 MG TABS Take 2 tablets by mouth at bedtime.    . mirtazapine (REMERON) 7.5 MG tablet Take 7.5 mg by mouth at bedtime.    Marland Kitchen morphine (MS CONTIN) 15 MG 12 hr tablet Take 15 mg by mouth every 8 (eight) hours.    Marland Kitchen morphine (MS CONTIN) 30 MG 12 hr tablet Take 30 mg by mouth every 8 (eight) hours.     . naloxone (NARCAN) nasal spray 4 mg/0.1 mL Place 1 spray into the nose.    . ondansetron (ZOFRAN) 4 MG tablet Take 4 mg by mouth every 12 (twelve) hours as needed for nausea or vomiting.    Marland Kitchen oxybutynin (DITROPAN-XL) 5 MG 24 hr tablet Take 10 mg by mouth daily.     . polyethylene glycol (MIRALAX / GLYCOLAX) packet Take 17 g by mouth daily as needed.    . Polyethylene Glycol 400 (BLINK TEARS) 0.25 % SOLN Apply to eye every 8 (eight) hours.    . potassium chloride (KLOR-CON SPRINKLE) 10 MEQ CR capsule Take 10 mEq by mouth daily.     . Probiotic Product (PROBIOTIC ADVANCED) CAPS Take by mouth every morning.    . Saline 0.2 % SOLN Place 1 spray into the nose 4 (four) times daily.    Marland Kitchen senna (SENOKOT) 8.6 MG TABS tablet Take 1 tablet by mouth every 12 (twelve) hours.    . sitaGLIPtin (JANUVIA) 100 MG tablet Take 100 mg by mouth daily.    . Sofosbuvir-Velpatasvir (EPCLUSA) 400-100 MG TABS Take 1 tablet by mouth daily.    . traZODone (DESYREL) 100 MG tablet Take 300 mg by mouth at bedtime.      No current facility-administered medications for this visit.    Allergies as of 04/26/2020  . (No Known Allergies)    Past Medical History:  Diagnosis Date  . Anemia   . Anxiety   . Arthritis   . COPD (chronic obstructive pulmonary disease) (HCC)   . Depression   . Dyspnea   . Fibromyalgia   . GERD (gastroesophageal reflux disease)   . Headache   . Heart failure (HCC)   .  Heart murmur   . Hepatitis   . History of Clostridium difficile infection   . History of methicillin resistant staphylococcus aureus (MRSA)   . Insomnia   . Overactive bladder   . Pneumonia    Past Surgical History:  Procedure Laterality Date  . APPENDECTOMY    . BACK SURGERY     twice  . DIAGNOSTIC LAPAROSCOPY     checking for endometriosis  . ESOPHAGOGASTRODUODENOSCOPY (EGD) WITH PROPOFOL N/A 11/21/2017   Dr. Jena Gaussourk: Erosive reflux esophagitis, low-grade narrowing Schatzki ring status post dilation, small hiatal hernia.  Marland Kitchen. FEMUR FRACTURE SURGERY Right    Rods placed  . HEMORRHOID SURGERY    . INNER EAR SURGERY Left   . MALONEY DILATION N/A 11/21/2017   Procedure: Elease HashimotoMALONEY DILATION;  Surgeon: Corbin Adeourk, Robert M, MD;  Location: [facility];  Service: Endoscopy;  Laterality: N/A;  . NASAL SINUS SURGERY    . right hip surgery     [place]: "took my hip out due to MRSA" occurred after hip replacements   Family History  Problem Relation Age of Onset  . Cancer Father        unknown  . Colon cancer Neg Hx    Social History   Tobacco Use  . Smoking status: Current Some Day Smoker    Packs/day: 0.50    Types: Cigarettes  . Smokeless tobacco: Never Used  . Tobacco comment: smokes 1/2 pack every other day  Vaping Use  . Vaping Use: Never used  Substance Use Topics  . Alcohol use: No  . Drug use: No    ROS:  General: Negative for anorexia, weight loss, fever, chills, fatigue, weakness. ENT: Negative for hoarseness,nasal congestion.  See HPI CV: Negative for chest pain, angina, palpitations, dyspnea on exertion, peripheral edema.  Respiratory: Negative for dyspnea at rest, dyspnea on exertion, cough, sputum,  wheezing.  GI: See history of present illness. GU:  Negative for dysuria, hematuria, urinary incontinence, urinary frequency, nocturnal urination.  Endo: Negative for unusual weight change.    Physical Examination:   BP 119/79   Pulse 60   Temp (!) 97.3 F (36.3 C) (Temporal)   Ht 5\' 2"  (1.575 m)   Wt 210 lb (95.3 kg)   BMI 38.41 kg/m   General: Well-nourished, well-developed in no acute distress.  Accompanied by staff from the nursing home. Eyes: No icterus. Mouth: masked Lungs: Clear to auscultation bilaterally.  Heart: Regular rate and rhythm, no murmurs rubs or gallops.  Abdomen: Bowel sounds are normal, nontender, nondistended, no hepatosplenomegaly or masses, no abdominal bruits or hernia , no rebound or guarding.   Extremities: No lower extremity edema. No clubbing or deformities. Neuro: Alert and oriented x 4   Skin: Warm and dry, no jaundice.   Psych: Alert and cooperative, normal mood and affect.    Imaging Studies: No results found.  Impression/plan:  64 year old female with history of postprandial epigastric pain, nausea with intermittent vomiting, delayed gastric emptying, chronic hepatitis C, poorly controlled GERD presenting for follow-up.  GERD/delayed gastric emptying/dysphagia/epigastric pain: Poorly managed with Pepcid.  Had been doing much better on PPI therapy prior to having to discontinue it in the setting of Epclusa/HCV treatment.  Symptoms likely multifactorial in the setting of refractory GERD, gastroparesis, cannot exclude esophageal stricture.  Stop Pepcid.  Began pantoprazole 40 mg twice daily.  Continue Zofran 4 mg before every meal and nightly.  Add low-dose Phenergan to be used sparingly.  Of note CT earlier in March done to evaluate  epigastric pain and vomiting showed mild distal esophageal wall thickening.  Given refractory symptoms, persistent vomiting, possible dysphagia, plan for EGD with esophageal dilation in the near future with propofol. ASA  III.  I have discussed the risks, alternatives, benefits with regards to but not limited to the risk of reaction to medication, bleeding, infection, perforation and the patient is agreeable to proceed. Written consent to be obtained.  Chronic hepatitis C: Likely lapse in therapy as outlined above.  Unclear when she completed third bottle of Epclusa but according to timeline that we have available, likely mid to late June.  She had negative HCVRNA in April and again on March 11, 2020.  We will recheck HCVRNA in 3 months to determine SVR.  We will have the patient come back in 6 months for follow-up, she desires to see Dr. Jena Gauss next visit.

## 2020-04-26 NOTE — Patient Instructions (Signed)
1. Stop Pepcid. 2. Start pantoprazole 40 mg 30 minutes before breakfast and 30 minutes before evening meal. 3. Start Phenergan 12.5 mg every 8 hours as needed for nausea and vomiting, no more than twice daily. 4. Upper endoscopy to be scheduled. 5. Please send copy of labs ordered back in May 2021, HCVRNA and CMET. 6. Return office visit in 6 months with Dr. Jena Gauss only.

## 2020-04-28 ENCOUNTER — Telehealth: Payer: Self-pay | Admitting: Gastroenterology

## 2020-04-28 NOTE — Telephone Encounter (Signed)
Reviewed labs from nursing home.  Labs from March 11, 2020: HCVRNA was negative.  Patient should have completed her third bottle of Epclusa at this point but unfortunately she had a lapse in therapy.  She is at risk for relapse.  Please arrange for repeat labs in October 2021 HCVRNA quantitative C-Met PT/INR CBC

## 2020-04-29 DIAGNOSIS — K219 Gastro-esophageal reflux disease without esophagitis: Secondary | ICD-10-CM | POA: Diagnosis not present

## 2020-04-29 DIAGNOSIS — I509 Heart failure, unspecified: Secondary | ICD-10-CM | POA: Diagnosis not present

## 2020-04-29 DIAGNOSIS — E785 Hyperlipidemia, unspecified: Secondary | ICD-10-CM | POA: Diagnosis not present

## 2020-04-29 DIAGNOSIS — M6281 Muscle weakness (generalized): Secondary | ICD-10-CM | POA: Diagnosis not present

## 2020-05-02 DIAGNOSIS — M25551 Pain in right hip: Secondary | ICD-10-CM | POA: Diagnosis not present

## 2020-05-03 NOTE — Telephone Encounter (Signed)
Letter mailed to pts facility.

## 2020-05-05 NOTE — Telephone Encounter (Signed)
Received a call from [facility]. Pt has been discharged from [facility] since the company hasn't gotten her last shipment. Pt has lapsed in treatment and will repeat labs in October 2021 per Tana Coast, PA.

## 2020-05-06 ENCOUNTER — Telehealth: Payer: Self-pay | Admitting: *Deleted

## 2020-05-06 DIAGNOSIS — H9211 Otorrhea, right ear: Secondary | ICD-10-CM | POA: Diagnosis not present

## 2020-05-06 DIAGNOSIS — H6983 Other specified disorders of Eustachian tube, bilateral: Secondary | ICD-10-CM | POA: Diagnosis not present

## 2020-05-06 DIAGNOSIS — T85695A Other mechanical complication of other nervous system device, implant or graft, initial encounter: Secondary | ICD-10-CM | POA: Diagnosis not present

## 2020-05-06 NOTE — Telephone Encounter (Signed)
Patient needs EGD +/-ed with propofol with Dr. Jena Gauss, ASA 3 Patient in facility.  Called facility and line rang numerous times, no answer

## 2020-05-09 DIAGNOSIS — R100 Acute abdomen: Secondary | ICD-10-CM | POA: Diagnosis not present

## 2020-05-12 DIAGNOSIS — F339 Major depressive disorder, recurrent, unspecified: Secondary | ICD-10-CM | POA: Diagnosis not present

## 2020-05-12 DIAGNOSIS — F419 Anxiety disorder, unspecified: Secondary | ICD-10-CM | POA: Diagnosis not present

## 2020-05-18 DIAGNOSIS — B351 Tinea unguium: Secondary | ICD-10-CM | POA: Diagnosis not present

## 2020-05-18 DIAGNOSIS — E1159 Type 2 diabetes mellitus with other circulatory complications: Secondary | ICD-10-CM | POA: Diagnosis not present

## 2020-05-19 DIAGNOSIS — E1169 Type 2 diabetes mellitus with other specified complication: Secondary | ICD-10-CM | POA: Diagnosis not present

## 2020-05-19 DIAGNOSIS — N3289 Other specified disorders of bladder: Secondary | ICD-10-CM | POA: Diagnosis not present

## 2020-05-19 DIAGNOSIS — F339 Major depressive disorder, recurrent, unspecified: Secondary | ICD-10-CM | POA: Diagnosis not present

## 2020-05-19 DIAGNOSIS — I509 Heart failure, unspecified: Secondary | ICD-10-CM | POA: Diagnosis not present

## 2020-05-19 DIAGNOSIS — J449 Chronic obstructive pulmonary disease, unspecified: Secondary | ICD-10-CM | POA: Diagnosis not present

## 2020-05-22 ENCOUNTER — Telehealth: Payer: Self-pay | Admitting: Gastroenterology

## 2020-05-22 NOTE — Telephone Encounter (Signed)
What is status of scheduling her EGD. See telephone note from 05/06/20.   Durward Mallard, it has been very difficult getting anything accomplished with this patient, difficult getting in touch with nursing home.

## 2020-05-24 ENCOUNTER — Encounter: Payer: Self-pay | Admitting: General Practice

## 2020-05-24 NOTE — Telephone Encounter (Signed)
I tried to call the nursing facility and was unable to reach them.  I will try sending a letter.

## 2020-05-26 DIAGNOSIS — R262 Difficulty in walking, not elsewhere classified: Secondary | ICD-10-CM | POA: Diagnosis not present

## 2020-05-26 DIAGNOSIS — M6281 Muscle weakness (generalized): Secondary | ICD-10-CM | POA: Diagnosis not present

## 2020-05-26 DIAGNOSIS — J309 Allergic rhinitis, unspecified: Secondary | ICD-10-CM | POA: Diagnosis not present

## 2020-05-26 DIAGNOSIS — I509 Heart failure, unspecified: Secondary | ICD-10-CM | POA: Diagnosis not present

## 2020-05-26 DIAGNOSIS — E559 Vitamin D deficiency, unspecified: Secondary | ICD-10-CM | POA: Diagnosis not present

## 2020-05-27 ENCOUNTER — Telehealth: Payer: Self-pay | Admitting: Internal Medicine

## 2020-05-27 DIAGNOSIS — F329 Major depressive disorder, single episode, unspecified: Secondary | ICD-10-CM | POA: Diagnosis not present

## 2020-05-27 DIAGNOSIS — F339 Major depressive disorder, recurrent, unspecified: Secondary | ICD-10-CM | POA: Diagnosis not present

## 2020-05-27 DIAGNOSIS — J449 Chronic obstructive pulmonary disease, unspecified: Secondary | ICD-10-CM | POA: Diagnosis not present

## 2020-05-27 DIAGNOSIS — E785 Hyperlipidemia, unspecified: Secondary | ICD-10-CM | POA: Diagnosis not present

## 2020-05-27 DIAGNOSIS — I509 Heart failure, unspecified: Secondary | ICD-10-CM | POA: Diagnosis not present

## 2020-05-27 DIAGNOSIS — E1169 Type 2 diabetes mellitus with other specified complication: Secondary | ICD-10-CM | POA: Diagnosis not present

## 2020-05-27 DIAGNOSIS — J441 Chronic obstructive pulmonary disease with (acute) exacerbation: Secondary | ICD-10-CM | POA: Diagnosis not present

## 2020-05-27 DIAGNOSIS — E559 Vitamin D deficiency, unspecified: Secondary | ICD-10-CM | POA: Diagnosis not present

## 2020-05-27 NOTE — Telephone Encounter (Signed)
[phone] Coralee Rud Day from the [facility] received a letter that the patient needs to be scheduled for a procedure  Please call back

## 2020-05-30 ENCOUNTER — Encounter: Payer: Self-pay | Admitting: *Deleted

## 2020-05-30 NOTE — Telephone Encounter (Signed)
LMOVM for pt to call back for [facility]

## 2020-05-30 NOTE — Telephone Encounter (Signed)
Gloria Chapman returned call. Patient has been scheduled for EGD +/-ed with propofol with Dr. Jena Gauss on 11/8 at 12:30pm. Aware will need to arrive at 9:00am for rapid covid test prior. Advised me to fax appt details to [phone].

## 2020-06-03 DIAGNOSIS — T85695A Other mechanical complication of other nervous system device, implant or graft, initial encounter: Secondary | ICD-10-CM | POA: Diagnosis not present

## 2020-06-03 DIAGNOSIS — H6591 Unspecified nonsuppurative otitis media, right ear: Secondary | ICD-10-CM | POA: Diagnosis not present

## 2020-06-03 DIAGNOSIS — H6983 Other specified disorders of Eustachian tube, bilateral: Secondary | ICD-10-CM | POA: Diagnosis not present

## 2020-06-03 DIAGNOSIS — Z720 Tobacco use: Secondary | ICD-10-CM | POA: Diagnosis not present

## 2020-06-13 DIAGNOSIS — M79604 Pain in right leg: Secondary | ICD-10-CM | POA: Diagnosis not present

## 2020-06-13 DIAGNOSIS — G894 Chronic pain syndrome: Secondary | ICD-10-CM | POA: Diagnosis not present

## 2020-06-13 DIAGNOSIS — M7061 Trochanteric bursitis, right hip: Secondary | ICD-10-CM | POA: Diagnosis not present

## 2020-06-13 DIAGNOSIS — M545 Low back pain: Secondary | ICD-10-CM | POA: Diagnosis not present

## 2020-06-13 DIAGNOSIS — M47896 Other spondylosis, lumbar region: Secondary | ICD-10-CM | POA: Diagnosis not present

## 2020-06-13 DIAGNOSIS — F339 Major depressive disorder, recurrent, unspecified: Secondary | ICD-10-CM | POA: Diagnosis not present

## 2020-06-13 DIAGNOSIS — M25551 Pain in right hip: Secondary | ICD-10-CM | POA: Diagnosis not present

## 2020-06-13 DIAGNOSIS — F419 Anxiety disorder, unspecified: Secondary | ICD-10-CM | POA: Diagnosis not present

## 2020-06-15 DIAGNOSIS — Z20828 Contact with and (suspected) exposure to other viral communicable diseases: Secondary | ICD-10-CM | POA: Diagnosis not present

## 2020-06-15 DIAGNOSIS — R69 Illness, unspecified: Secondary | ICD-10-CM | POA: Diagnosis not present

## 2020-06-15 DIAGNOSIS — D649 Anemia, unspecified: Secondary | ICD-10-CM | POA: Diagnosis not present

## 2020-06-15 DIAGNOSIS — R7989 Other specified abnormal findings of blood chemistry: Secondary | ICD-10-CM | POA: Diagnosis not present

## 2020-06-15 DIAGNOSIS — B182 Chronic viral hepatitis C: Secondary | ICD-10-CM | POA: Diagnosis not present

## 2020-06-20 DIAGNOSIS — I509 Heart failure, unspecified: Secondary | ICD-10-CM | POA: Diagnosis not present

## 2020-06-20 DIAGNOSIS — E1169 Type 2 diabetes mellitus with other specified complication: Secondary | ICD-10-CM | POA: Diagnosis not present

## 2020-06-20 DIAGNOSIS — R262 Difficulty in walking, not elsewhere classified: Secondary | ICD-10-CM | POA: Diagnosis not present

## 2020-06-20 DIAGNOSIS — M6281 Muscle weakness (generalized): Secondary | ICD-10-CM | POA: Diagnosis not present

## 2020-06-22 DIAGNOSIS — D649 Anemia, unspecified: Secondary | ICD-10-CM | POA: Diagnosis not present

## 2020-06-22 DIAGNOSIS — B182 Chronic viral hepatitis C: Secondary | ICD-10-CM | POA: Diagnosis not present

## 2020-06-22 DIAGNOSIS — N3281 Overactive bladder: Secondary | ICD-10-CM | POA: Diagnosis not present

## 2020-06-23 DIAGNOSIS — M6281 Muscle weakness (generalized): Secondary | ICD-10-CM | POA: Diagnosis not present

## 2020-06-23 DIAGNOSIS — R262 Difficulty in walking, not elsewhere classified: Secondary | ICD-10-CM | POA: Diagnosis not present

## 2020-06-23 DIAGNOSIS — E559 Vitamin D deficiency, unspecified: Secondary | ICD-10-CM | POA: Diagnosis not present

## 2020-06-23 DIAGNOSIS — K219 Gastro-esophageal reflux disease without esophagitis: Secondary | ICD-10-CM | POA: Diagnosis not present

## 2020-06-23 DIAGNOSIS — I739 Peripheral vascular disease, unspecified: Secondary | ICD-10-CM | POA: Diagnosis not present

## 2020-06-23 DIAGNOSIS — I509 Heart failure, unspecified: Secondary | ICD-10-CM | POA: Diagnosis not present

## 2020-06-24 DIAGNOSIS — M6281 Muscle weakness (generalized): Secondary | ICD-10-CM | POA: Diagnosis not present

## 2020-06-24 DIAGNOSIS — J449 Chronic obstructive pulmonary disease, unspecified: Secondary | ICD-10-CM | POA: Diagnosis not present

## 2020-06-24 DIAGNOSIS — R262 Difficulty in walking, not elsewhere classified: Secondary | ICD-10-CM | POA: Diagnosis not present

## 2020-06-27 DIAGNOSIS — J449 Chronic obstructive pulmonary disease, unspecified: Secondary | ICD-10-CM | POA: Diagnosis not present

## 2020-06-27 DIAGNOSIS — R262 Difficulty in walking, not elsewhere classified: Secondary | ICD-10-CM | POA: Diagnosis not present

## 2020-06-27 DIAGNOSIS — M6281 Muscle weakness (generalized): Secondary | ICD-10-CM | POA: Diagnosis not present

## 2020-06-28 DIAGNOSIS — F339 Major depressive disorder, recurrent, unspecified: Secondary | ICD-10-CM | POA: Diagnosis not present

## 2020-06-28 DIAGNOSIS — J449 Chronic obstructive pulmonary disease, unspecified: Secondary | ICD-10-CM | POA: Diagnosis not present

## 2020-06-28 DIAGNOSIS — R262 Difficulty in walking, not elsewhere classified: Secondary | ICD-10-CM | POA: Diagnosis not present

## 2020-06-28 DIAGNOSIS — F329 Major depressive disorder, single episode, unspecified: Secondary | ICD-10-CM | POA: Diagnosis not present

## 2020-06-28 DIAGNOSIS — I509 Heart failure, unspecified: Secondary | ICD-10-CM | POA: Diagnosis not present

## 2020-06-28 DIAGNOSIS — E559 Vitamin D deficiency, unspecified: Secondary | ICD-10-CM | POA: Diagnosis not present

## 2020-06-28 DIAGNOSIS — E1169 Type 2 diabetes mellitus with other specified complication: Secondary | ICD-10-CM | POA: Diagnosis not present

## 2020-06-28 DIAGNOSIS — M6281 Muscle weakness (generalized): Secondary | ICD-10-CM | POA: Diagnosis not present

## 2020-06-28 DIAGNOSIS — J441 Chronic obstructive pulmonary disease with (acute) exacerbation: Secondary | ICD-10-CM | POA: Diagnosis not present

## 2020-06-28 DIAGNOSIS — E785 Hyperlipidemia, unspecified: Secondary | ICD-10-CM | POA: Diagnosis not present

## 2020-06-29 DIAGNOSIS — N3946 Mixed incontinence: Secondary | ICD-10-CM | POA: Diagnosis not present

## 2020-06-29 DIAGNOSIS — N3281 Overactive bladder: Secondary | ICD-10-CM | POA: Diagnosis not present

## 2020-07-01 DIAGNOSIS — J449 Chronic obstructive pulmonary disease, unspecified: Secondary | ICD-10-CM | POA: Diagnosis not present

## 2020-07-01 DIAGNOSIS — M6281 Muscle weakness (generalized): Secondary | ICD-10-CM | POA: Diagnosis not present

## 2020-07-01 DIAGNOSIS — R262 Difficulty in walking, not elsewhere classified: Secondary | ICD-10-CM | POA: Diagnosis not present

## 2020-07-06 DIAGNOSIS — M47816 Spondylosis without myelopathy or radiculopathy, lumbar region: Secondary | ICD-10-CM | POA: Diagnosis not present

## 2020-07-06 DIAGNOSIS — M5126 Other intervertebral disc displacement, lumbar region: Secondary | ICD-10-CM | POA: Diagnosis not present

## 2020-07-06 DIAGNOSIS — M47896 Other spondylosis, lumbar region: Secondary | ICD-10-CM | POA: Diagnosis not present

## 2020-07-06 DIAGNOSIS — M4807 Spinal stenosis, lumbosacral region: Secondary | ICD-10-CM | POA: Diagnosis not present

## 2020-07-06 DIAGNOSIS — M48061 Spinal stenosis, lumbar region without neurogenic claudication: Secondary | ICD-10-CM | POA: Diagnosis not present

## 2020-07-12 DIAGNOSIS — M545 Low back pain, unspecified: Secondary | ICD-10-CM | POA: Diagnosis not present

## 2020-07-12 DIAGNOSIS — G894 Chronic pain syndrome: Secondary | ICD-10-CM | POA: Diagnosis not present

## 2020-07-12 DIAGNOSIS — M7061 Trochanteric bursitis, right hip: Secondary | ICD-10-CM | POA: Diagnosis not present

## 2020-07-12 DIAGNOSIS — M25551 Pain in right hip: Secondary | ICD-10-CM | POA: Diagnosis not present

## 2020-07-12 DIAGNOSIS — M79604 Pain in right leg: Secondary | ICD-10-CM | POA: Diagnosis not present

## 2020-07-12 DIAGNOSIS — M47896 Other spondylosis, lumbar region: Secondary | ICD-10-CM | POA: Diagnosis not present

## 2020-07-12 DIAGNOSIS — M48061 Spinal stenosis, lumbar region without neurogenic claudication: Secondary | ICD-10-CM | POA: Diagnosis not present

## 2020-07-18 DIAGNOSIS — E1169 Type 2 diabetes mellitus with other specified complication: Secondary | ICD-10-CM | POA: Diagnosis not present

## 2020-07-18 DIAGNOSIS — M6281 Muscle weakness (generalized): Secondary | ICD-10-CM | POA: Diagnosis not present

## 2020-07-18 DIAGNOSIS — I509 Heart failure, unspecified: Secondary | ICD-10-CM | POA: Diagnosis not present

## 2020-07-18 DIAGNOSIS — J449 Chronic obstructive pulmonary disease, unspecified: Secondary | ICD-10-CM | POA: Diagnosis not present

## 2020-07-20 NOTE — Patient Instructions (Signed)
Gloria Chapman  07/20/2020     @PREFPERIOPPHARMACY @   Your procedure is scheduled on  07/25/2020.  Report to [facility] at  0930  A.M.  Call this number if you have problems the morning of surgery:  [phone]   Remember:  Do not eat or drink after midnight.                       Take these medicines the morning of surgery with A SIP OF WATER  Cymbalta, hydromorphone(if needed), hydralazine, ativan(if needed), MS contin(if needed), ditropan, phenergan(if needed). Use your inhaler before you come. Take 15 units of Lantus the night before your procedure. DO NOT take any medications for diabetes the morning of your procedure. _    Do not wear jewelry, make-up or nail polish.  Do not wear lotions, powders, or perfumes. Please wear deodorant and brush your teeth. Men may shave face and neck.  Do not bring valuables to the hospital.  [facility] is not responsible for any belongings or valuables.  Contacts, dentures or bridgework may not be worn into surgery.  Leave your suitcase in the car.  After surgery it may be brought to your room.  For patients admitted to the hospital, discharge time will be determined by your treatment team.  Patients discharged the day of surgery will not be allowed to drive home.   Name and phone number of your driver:   family Special instructions:   DO NOT smoke the morning of your procedure.  Please read over the following fact sheets that you were given. Anesthesia Post-op Instructions and Care and Recovery After Surgery       Upper Endoscopy, Adult, Care After This sheet gives you information about how to care for yourself after your procedure. Your health care provider may also give you more specific instructions. If you have problems or questions, contact your health care provider. What can I expect after the procedure? After the procedure, it is common to have:  A sore throat.  Mild stomach pain or  discomfort.  Bloating.  Nausea. Follow these instructions at home:   Follow instructions from your health care provider about what to eat or drink after your procedure.  Return to your normal activities as told by your health care provider. Ask your health care provider what activities are safe for you.  Take over-the-counter and prescription medicines only as told by your health care provider.  Do not drive for 24 hours if you were given a sedative during your procedure.  Keep all follow-up visits as told by your health care provider. This is important. Contact a health care provider if you have:  A sore throat that lasts longer than one day.  Trouble swallowing. Get help right away if:  You vomit blood or your vomit looks like coffee grounds.  You have: ? A fever. ? Bloody, black, or tarry stools. ? A severe sore throat or you cannot swallow. ? Difficulty breathing. ? Severe pain in your chest or abdomen. Summary  After the procedure, it is common to have a sore throat, mild stomach discomfort, bloating, and nausea.  Do not drive for 24 hours if you were given a sedative during the procedure.  Follow instructions from your health care provider about what to eat or drink after your procedure.  Return to your normal activities as told by your health care provider. This information is not intended to  replace advice given to you by your health care provider. Make sure you discuss any questions you have with your health care provider. Document Revised: 02/25/2018 Document Reviewed: 02/03/2018 Elsevier Patient Education  2020 [employer]  Esophageal Dilatation Esophageal dilatation, also called esophageal dilation, is a procedure to widen or open (dilate) a blocked or narrowed part of the esophagus. The esophagus is the part of the body that moves food and liquid from the mouth to the stomach. You may need this procedure if:  You have a buildup of scar tissue in your  esophagus that makes it difficult, painful, or impossible to swallow. This can be caused by gastroesophageal reflux disease (GERD).  You have cancer of the esophagus.  There is a problem with how food moves through your esophagus. In some cases, you may need this procedure repeated at a later time to dilate the esophagus gradually. Tell a health care provider about:  Any allergies you have.  All medicines you are taking, including vitamins, herbs, eye drops, creams, and over-the-counter medicines.  Any problems you or family members have had with anesthetic medicines.  Any blood disorders you have.  Any surgeries you have had.  Any medical conditions you have.  Any antibiotic medicines you are required to take before dental procedures.  Whether you are pregnant or may be pregnant. What are the risks? Generally, this is a safe procedure. However, problems may occur, including:  Bleeding due to a tear in the lining of the esophagus.  A hole (perforation) in the esophagus. What happens before the procedure?  Follow instructions from your health care provider about eating or drinking restrictions.  Ask your health care provider about changing or stopping your regular medicines. This is especially important if you are taking diabetes medicines or blood thinners.  Plan to have someone take you home from the hospital or clinic.  Plan to have a responsible adult care for you for at least 24 hours after you leave the hospital or clinic. This is important. What happens during the procedure?  You may be given a medicine to help you relax (sedative).  A numbing medicine may be sprayed into the back of your throat, or you may gargle the medicine.  Your health care provider may perform the dilatation using various surgical instruments, such as: ? Simple dilators. This instrument is carefully placed in the esophagus to stretch it. ? Guided wire bougies. This involves using an endoscope  to insert a wire into the esophagus. A dilator is passed over this wire to enlarge the esophagus. Then the wire is removed. ? Balloon dilators. An endoscope with a small balloon at the end is inserted into the esophagus. The balloon is inflated to stretch the esophagus and open it up. The procedure may vary among health care providers and hospitals. What happens after the procedure?  Your blood pressure, heart rate, breathing rate, and blood oxygen level will be monitored until the medicines you were given have worn off.  Your throat may feel slightly sore and numb. This will improve slowly over time.  You will not be allowed to eat or drink until your throat is no longer numb.  When you are able to drink, urinate, and sit on the edge of the bed without nausea or dizziness, you may be able to return home. Follow these instructions at home:  Take over-the-counter and prescription medicines only as told by your health care provider.  Do not drive for 24 hours if you were  given a sedative during your procedure.  You should have a responsible adult with you for 24 hours after the procedure.  Follow instructions from your health care provider about any eating or drinking restrictions.  Do not use any products that contain nicotine or tobacco, such as cigarettes and e-cigarettes. If you need help quitting, ask your health care provider.  Keep all follow-up visits as told by your health care provider. This is important. Get help right away if you:  Have a fever.  Have chest pain.  Have pain that is not relieved by medication.  Have trouble breathing.  Have trouble swallowing.  Vomit blood. Summary  Esophageal dilatation, also called esophageal dilation, is a procedure to widen or open (dilate) a blocked or narrowed part of the esophagus.  Plan to have someone take you home from the hospital or clinic.  For this procedure, a numbing medicine may be sprayed into the back of your  throat, or you may gargle the medicine.  Do not drive for 24 hours if you were given a sedative during your procedure. This information is not intended to replace advice given to you by your health care provider. Make sure you discuss any questions you have with your health care provider. Document Revised: 07/01/2019 Document Reviewed: 07/09/2017 Elsevier Patient Education  2020 [employer] Monitored Anesthesia Care, Care After These instructions provide you with information about caring for yourself after your procedure. Your health care provider may also give you more specific instructions. Your treatment has been planned according to current medical practices, but problems sometimes occur. Call your health care provider if you have any problems or questions after your procedure. What can I expect after the procedure? After your procedure, you may:  Feel sleepy for several hours.  Feel clumsy and have poor balance for several hours.  Feel forgetful about what happened after the procedure.  Have poor judgment for several hours.  Feel nauseous or vomit.  Have a sore throat if you had a breathing tube during the procedure. Follow these instructions at home: For at least 24 hours after the procedure:      Have a responsible adult stay with you. It is important to have someone help care for you until you are awake and alert.  Rest as needed.  Do not: ? Participate in activities in which you could fall or become injured. ? Drive. ? Use heavy machinery. ? Drink alcohol. ? Take sleeping pills or medicines that cause drowsiness. ? Make important decisions or sign legal documents. ? Take care of children on your own. Eating and drinking  Follow the diet that is recommended by your health care provider.  If you vomit, drink water, juice, or soup when you can drink without vomiting.  Make sure you have little or no nausea before eating solid foods. General instructions  Take  over-the-counter and prescription medicines only as told by your health care provider.  If you have sleep apnea, surgery and certain medicines can increase your risk for breathing problems. Follow instructions from your health care provider about wearing your sleep device: ? Anytime you are sleeping, including during daytime naps. ? While taking prescription pain medicines, sleeping medicines, or medicines that make you drowsy.  If you smoke, do not smoke without supervision.  Keep all follow-up visits as told by your health care provider. This is important. Contact a health care provider if:  You keep feeling nauseous or you keep vomiting.  You feel light-headed.  You  develop a rash.  You have a fever. Get help right away if:  You have trouble breathing. Summary  For several hours after your procedure, you may feel sleepy and have poor judgment.  Have a responsible adult stay with you for at least 24 hours or until you are awake and alert. This information is not intended to replace advice given to you by your health care provider. Make sure you discuss any questions you have with your health care provider. Document Revised: 12/02/2017 Document Reviewed: 12/25/2015 Elsevier Patient Education  2020 [employer].

## 2020-07-21 ENCOUNTER — Encounter (HOSPITAL_COMMUNITY): Payer: Self-pay

## 2020-07-21 ENCOUNTER — Encounter (HOSPITAL_COMMUNITY)
Admission: RE | Admit: 2020-07-21 | Discharge: 2020-07-21 | Disposition: A | Discharge status: Home / Self Care | Payer: Medicare Other | Source: Ambulatory Visit | Attending: Internal Medicine | Admitting: Internal Medicine

## 2020-07-21 ENCOUNTER — Other Ambulatory Visit: Payer: Self-pay

## 2020-07-21 DIAGNOSIS — Z01818 Encounter for other preprocedural examination: Secondary | ICD-10-CM | POA: Diagnosis not present

## 2020-07-21 DIAGNOSIS — N390 Urinary tract infection, site not specified: Secondary | ICD-10-CM | POA: Diagnosis not present

## 2020-07-21 DIAGNOSIS — N3281 Overactive bladder: Secondary | ICD-10-CM | POA: Diagnosis not present

## 2020-07-21 LAB — CBC WITH DIFFERENTIAL/PLATELET
Abs Immature Granulocytes: 0.06 10*3/uL (ref 0.00–0.07)
Basophils Absolute: 0 10*3/uL (ref 0.0–0.1)
Basophils Relative: 0 %
Eosinophils Absolute: 0.1 10*3/uL (ref 0.0–0.5)
Eosinophils Relative: 1 %
HCT: 46.4 % — ABNORMAL HIGH (ref 36.0–46.0)
Hemoglobin: 15.3 g/dL — ABNORMAL HIGH (ref 12.0–15.0)
Immature Granulocytes: 1 %
Lymphocytes Relative: 24 %
Lymphs Abs: 2.5 10*3/uL (ref 0.7–4.0)
MCH: 29.2 pg (ref 26.0–34.0)
MCHC: 33 g/dL (ref 30.0–36.0)
MCV: 88.5 fL (ref 80.0–100.0)
Monocytes Absolute: 0.5 10*3/uL (ref 0.1–1.0)
Monocytes Relative: 4 %
Neutro Abs: 7.2 10*3/uL (ref 1.7–7.7)
Neutrophils Relative %: 70 %
Platelets: 229 10*3/uL (ref 150–400)
RBC: 5.24 MIL/uL — ABNORMAL HIGH (ref 3.87–5.11)
RDW: 14.7 % (ref 11.5–15.5)
WBC: 10.3 10*3/uL (ref 4.0–10.5)
nRBC: 0 % (ref 0.0–0.2)

## 2020-07-21 LAB — COMPREHENSIVE METABOLIC PANEL
ALT: 12 U/L (ref 0–44)
AST: 15 U/L (ref 15–41)
Albumin: 3.8 g/dL (ref 3.5–5.0)
Alkaline Phosphatase: 64 U/L (ref 38–126)
Anion gap: 11 (ref 5–15)
BUN: 8 mg/dL (ref 8–23)
CO2: 25 mmol/L (ref 22–32)
Calcium: 8.6 mg/dL — ABNORMAL LOW (ref 8.9–10.3)
Chloride: 97 mmol/L — ABNORMAL LOW (ref 98–111)
Creatinine, Ser: 0.73 mg/dL (ref 0.44–1.00)
GFR, Estimated: >60 mL/min (ref >60)
Glucose, Bld: 177 mg/dL — ABNORMAL HIGH (ref 70–99)
Potassium: 4.3 mmol/L (ref 3.5–5.1)
Sodium: 133 mmol/L — ABNORMAL LOW (ref 135–145)
Total Bilirubin: 0.4 mg/dL (ref 0.3–1.2)
Total Protein: 7.3 g/dL (ref 6.5–8.1)

## 2020-07-25 ENCOUNTER — Encounter (HOSPITAL_COMMUNITY): Payer: Self-pay | Admitting: Internal Medicine

## 2020-07-25 ENCOUNTER — Ambulatory Visit (HOSPITAL_COMMUNITY): Payer: Medicare Other | Admitting: Anesthesiology

## 2020-07-25 ENCOUNTER — Ambulatory Visit (HOSPITAL_COMMUNITY)
Admission: RE | Admit: 2020-07-25 | Discharge: 2020-07-25 | Disposition: A | Discharge status: Home / Self Care | Payer: Medicare Other | Attending: Internal Medicine | Admitting: Internal Medicine

## 2020-07-25 ENCOUNTER — Encounter (HOSPITAL_COMMUNITY)
Admission: RE | Disposition: A | Discharge status: Home / Self Care | Payer: Self-pay | Source: Home / Self Care | Attending: Internal Medicine

## 2020-07-25 ENCOUNTER — Other Ambulatory Visit: Payer: Self-pay

## 2020-07-25 DIAGNOSIS — Z79899 Other long term (current) drug therapy: Secondary | ICD-10-CM | POA: Diagnosis not present

## 2020-07-25 DIAGNOSIS — E559 Vitamin D deficiency, unspecified: Secondary | ICD-10-CM | POA: Diagnosis not present

## 2020-07-25 DIAGNOSIS — Z7982 Long term (current) use of aspirin: Secondary | ICD-10-CM | POA: Diagnosis not present

## 2020-07-25 DIAGNOSIS — B370 Candidal stomatitis: Secondary | ICD-10-CM | POA: Insufficient documentation

## 2020-07-25 DIAGNOSIS — J449 Chronic obstructive pulmonary disease, unspecified: Secondary | ICD-10-CM | POA: Diagnosis not present

## 2020-07-25 DIAGNOSIS — Z8614 Personal history of Methicillin resistant Staphylococcus aureus infection: Secondary | ICD-10-CM | POA: Diagnosis not present

## 2020-07-25 DIAGNOSIS — Z20822 Contact with and (suspected) exposure to covid-19: Secondary | ICD-10-CM | POA: Diagnosis not present

## 2020-07-25 DIAGNOSIS — Z79891 Long term (current) use of opiate analgesic: Secondary | ICD-10-CM | POA: Insufficient documentation

## 2020-07-25 DIAGNOSIS — R1310 Dysphagia, unspecified: Secondary | ICD-10-CM | POA: Diagnosis not present

## 2020-07-25 DIAGNOSIS — E785 Hyperlipidemia, unspecified: Secondary | ICD-10-CM | POA: Diagnosis not present

## 2020-07-25 DIAGNOSIS — I509 Heart failure, unspecified: Secondary | ICD-10-CM | POA: Diagnosis not present

## 2020-07-25 DIAGNOSIS — I10 Essential (primary) hypertension: Secondary | ICD-10-CM | POA: Diagnosis not present

## 2020-07-25 DIAGNOSIS — F17210 Nicotine dependence, cigarettes, uncomplicated: Secondary | ICD-10-CM | POA: Diagnosis not present

## 2020-07-25 DIAGNOSIS — J441 Chronic obstructive pulmonary disease with (acute) exacerbation: Secondary | ICD-10-CM | POA: Diagnosis not present

## 2020-07-25 DIAGNOSIS — E1169 Type 2 diabetes mellitus with other specified complication: Secondary | ICD-10-CM | POA: Diagnosis not present

## 2020-07-25 DIAGNOSIS — F339 Major depressive disorder, recurrent, unspecified: Secondary | ICD-10-CM | POA: Diagnosis not present

## 2020-07-25 DIAGNOSIS — F329 Major depressive disorder, single episode, unspecified: Secondary | ICD-10-CM | POA: Diagnosis not present

## 2020-07-25 DIAGNOSIS — Z7951 Long term (current) use of inhaled steroids: Secondary | ICD-10-CM | POA: Diagnosis not present

## 2020-07-25 HISTORY — PX: ESOPHAGOGASTRODUODENOSCOPY (EGD) WITH PROPOFOL: SHX5813

## 2020-07-25 HISTORY — PX: MALONEY DILATION: SHX5535

## 2020-07-25 LAB — SARS CORONAVIRUS 2 BY RT PCR (HOSPITAL ORDER, PERFORMED IN ~~LOC~~ HOSPITAL LAB): SARS Coronavirus 2: NEGATIVE

## 2020-07-25 LAB — GLUCOSE, CAPILLARY: Glucose-Capillary: 128 mg/dL — ABNORMAL HIGH (ref 70–99)

## 2020-07-25 SURGERY — ESOPHAGOGASTRODUODENOSCOPY (EGD) WITH PROPOFOL
Anesthesia: General

## 2020-07-25 MED ORDER — LIDOCAINE VISCOUS HCL 2 % MT SOLN: 15.0000 mL | Freq: Once | OROMUCOSAL | Status: DC

## 2020-07-25 MED ORDER — CHLORHEXIDINE GLUCONATE CLOTH 2 % EX PADS: 6.0000 | MEDICATED_PAD | Freq: Once | CUTANEOUS | Status: DC

## 2020-07-25 MED ORDER — LACTATED RINGERS IV SOLN: Freq: Once | INTRAVENOUS | Status: AC

## 2020-07-25 MED ORDER — LIDOCAINE HCL (CARDIAC) PF 100 MG/5ML IV SOSY
PREFILLED_SYRINGE | INTRAVENOUS | Status: DC | PRN
  Administered 2020-07-25: 50 mg via INTRAVENOUS

## 2020-07-25 MED ORDER — LIDOCAINE VISCOUS HCL 2 % MT SOLN
OROMUCOSAL | Status: DC | PRN
  Administered 2020-07-25: 1 via OROMUCOSAL

## 2020-07-25 MED ORDER — LACTATED RINGERS IV SOLN: INTRAVENOUS | Status: DC | PRN

## 2020-07-25 MED ORDER — PROPOFOL 10 MG/ML IV BOLUS
INTRAVENOUS | Status: DC | PRN
  Administered 2020-07-25: 140 mg via INTRAVENOUS
  Administered 2020-07-25: 60 mg via INTRAVENOUS

## 2020-07-25 NOTE — Transfer of Care (Signed)
Immediate Anesthesia Transfer of Care Note  Patient: Gloria Chapman  Procedure(s) Performed: ESOPHAGOGASTRODUODENOSCOPY (EGD) WITH PROPOFOL (N/A ) MALONEY DILATION (N/A )  Patient Location: PACU  Anesthesia Type:General  Level of Consciousness: awake  Airway & Oxygen Therapy: Patient Spontanous Breathing and Patient connected to nasal cannula oxygen  Post-op Assessment: Report given to RN and Post -op Vital signs reviewed and stable  Post vital signs: Reviewed and stable  Last Vitals:  Vitals Value Taken Time  BP    Temp    Pulse 70 07/25/20 1242  Resp 12 07/25/20 1242  SpO2 96 % 07/25/20 1242  Vitals shown include unvalidated device data.  Last Pain:  Vitals:   07/25/20 1227  TempSrc:   PainSc: 7          Complications: No complications documented.

## 2020-07-25 NOTE — Discharge Instructions (Signed)
EGD Discharge instructions Please read the instructions outlined below and refer to this sheet in the next few weeks. These discharge instructions provide you with general information on caring for yourself after you leave the hospital. Your doctor may also give you specific instructions. While your treatment has been planned according to the most current medical practices available, unavoidable complications occasionally occur. If you have any problems or questions after discharge, please call your doctor. ACTIVITY  You may resume your regular activity but move at a slower pace for the next 24 hours.   Take frequent rest periods for the next 24 hours.   Walking will help expel (get rid of) the air and reduce the bloated feeling in your abdomen.   No driving for 24 hours (because of the anesthesia (medicine) used during the test).   You may shower.   Do not sign any important legal documents or operate any machinery for 24 hours (because of the anesthesia used during the test).  NUTRITION  Drink plenty of fluids.   You may resume your normal diet.   Begin with a light meal and progress to your normal diet.   Avoid alcoholic beverages for 24 hours or as instructed by your caregiver.  MEDICATIONS  You may resume your normal medications unless your caregiver tells you otherwise.  WHAT YOU CAN EXPECT TODAY  You may experience abdominal discomfort such as a feeling of fullness or "gas" pains.  FOLLOW-UP  Your doctor will discuss the results of your test with you.  SEEK IMMEDIATE MEDICAL ATTENTION IF ANY OF THE FOLLOWING OCCUR:  Excessive nausea (feeling sick to your stomach) and/or vomiting.   Severe abdominal pain and distention (swelling).   Trouble swallowing.   Temperature over 101 F (37.8 C).   Rectal bleeding or vomiting of blood.    You have a yeast infection in your mouth and esophagus.  We will treat with Diflucan  Your esophagus was stretched  today  Increase Protonix to 40 mg twice daily  Office visit with me in 3 months

## 2020-07-25 NOTE — Anesthesia Postprocedure Evaluation (Signed)
Anesthesia Post Note  Patient: Gloria Chapman  Procedure(s) Performed: ESOPHAGOGASTRODUODENOSCOPY (EGD) WITH PROPOFOL (N/A ) MALONEY DILATION (N/A )  Patient location during evaluation: Phase II Anesthesia Type: General Level of consciousness: awake and alert and oriented Pain management: pain level controlled Vital Signs Assessment: post-procedure vital signs reviewed and stable Respiratory status: spontaneous breathing, nonlabored ventilation and respiratory function stable Cardiovascular status: blood pressure returned to baseline and stable Postop Assessment: no apparent nausea or vomiting Anesthetic complications: no   No complications documented.   Last Vitals:  Vitals:   07/25/20 1300 07/25/20 1305  BP:  138/85  Pulse: (!) 59 64  Resp: 17 18  Temp:  36.6 C  SpO2: 93% 100%    Last Pain:  Vitals:   07/25/20 1245  TempSrc:   PainSc: 0-No pain                 Julian Reil

## 2020-07-25 NOTE — Anesthesia Procedure Notes (Signed)
Date/Time: 07/25/2020 12:36 PM Performed by: Julian Reil, CRNA Pre-anesthesia Checklist: Patient identified, Emergency Drugs available, Suction available and Patient being monitored Patient Re-evaluated:Patient Re-evaluated prior to induction Oxygen Delivery Method: Nasal cannula Induction Type: IV induction Placement Confirmation: positive ETCO2

## 2020-07-25 NOTE — H&P (Signed)
@LOGO @   Primary Care Physician:  , DO Primary Gastroenterologist:  Dr. Audree Bane  Pre-Procedure History & Physical: HPI:  Gloria Chapman is a 64 y.o. female here for further evaluation of epigastric pain reflux and dysphagia.  Also intermittent nausea.  On chronic narcotics.  She only taking Protonix 40 mg once daily.  We recommended 40 mg twice daily at her last office visit.  Past Medical History:  Diagnosis Date  . Anemia   . Anxiety   . Arthritis   . COPD (chronic obstructive pulmonary disease) (HCC)   . Depression   . Dyspnea   . Fibromyalgia   . GERD (gastroesophageal reflux disease)   . Headache   . Heart failure (HCC)   . Heart murmur   . Hepatitis   . History of Clostridium difficile infection   . History of methicillin resistant staphylococcus aureus (MRSA)   . Insomnia   . Overactive bladder   . Pneumonia     Past Surgical History:  Procedure Laterality Date  . APPENDECTOMY    . BACK SURGERY     twice  . DIAGNOSTIC LAPAROSCOPY     checking for endometriosis  . ESOPHAGOGASTRODUODENOSCOPY (EGD) WITH PROPOFOL N/A 11/21/2017   Dr. 01/21/2018: Erosive reflux esophagitis, low-grade narrowing Schatzki ring status post dilation, small hiatal hernia.  Jena Gauss FEMUR FRACTURE SURGERY Right    Rods placed  . HEMORRHOID SURGERY    . INNER EAR SURGERY Left   . MALONEY DILATION N/A 11/21/2017   Procedure: 01/21/2018 DILATION;  Surgeon: Elease Hashimoto, MD;  Location: [facility];  Service: Endoscopy;  Laterality: N/A;  . NASAL SINUS SURGERY    . right hip surgery     [place]: "took my hip out due to MRSA" occurred after hip replacements    Prior to Admission medications   Medication Sig Start Date End Date Taking? Authorizing Provider  acetaminophen (TYLENOL) 325 MG tablet Take 650 mg by mouth every 6 (six) hours as needed for mild pain or fever.    Yes [provider]  albuterol (VENTOLIN HFA) 108 (90 Base) MCG/ACT inhaler Inhale 2 puffs into the lungs every 8  (eight) hours as needed for wheezing or shortness of breath.  05/30/20  Yes [provider]  ammonium lactate (AMLACTIN) 12 % cream Apply 1 g topically every 12 (twelve) hours as needed for dry skin.    Yes [provider]  aspirin 325 MG tablet Take 325 mg by mouth every evening.    Yes [provider]  Cranberry 125 MG TABS Take 125 mg by mouth every 12 (twelve) hours.   Yes [provider]  diclofenac Sodium (VOLTAREN) 1 % GEL Apply 2 g topically 2 (two) times daily.    Yes [provider]  diphenhydrAMINE (BENADRYL) 25 MG tablet Take 25 mg by mouth every 6 (six) hours as needed for itching.    Yes [provider]  DULoxetine (CYMBALTA) 60 MG capsule Take 60 mg by mouth daily.   Yes [provider]  fluticasone furoate-vilanterol (BREO ELLIPTA) 100-25 MCG/INH AEPB Inhale 1 puff into the lungs daily.   Yes [provider]  furosemide (LASIX) 40 MG tablet Take 40 mg by mouth daily.   Yes [provider]  Glucagon HCl 1 MG SOLR Inject 1 mg as directed daily as needed (low blood sugar).    Yes [provider]  guaiFENesin (MUCINEX) 600 MG 12 hr tablet Take 600 mg by mouth daily.   Yes  [provider]  HYDROmorphone (DILAUDID) 2 MG tablet Take 2 mg by mouth every 12 (twelve) hours as needed (breakthrough pain).    Yes [provider]  hydrOXYzine (ATARAX/VISTARIL) 25 MG tablet Take 25 mg by mouth 4 (four) times daily.   Yes [provider]  insulin glargine (LANTUS) 100 UNIT/ML injection Inject 30 Units into the skin every evening.    Yes [provider]  insulin lispro (HUMALOG KWIKPEN) 100 UNIT/ML KwikPen Inject 0-10 Units into the skin See admin instructions. Inject as per sliding scale, blood sugar 60-200=0 units, 201-250= 2 units, 251-300=4 units, 301-350=6 units, 351-400=8 units, 401-450=10 units. If greater than 450 or less that 60 call onsite.   Yes [provider]   LORazepam (ATIVAN) 1 MG tablet Take 1 mg by mouth 3 (three) times daily.    Yes [provider]  Melatonin 5 MG TABS Take 10 mg by mouth at bedtime.    Yes [provider]  morphine (MS CONTIN) 30 MG 12 hr tablet Take 30 mg by mouth in the morning and at bedtime.  02/18/18  Yes [provider]  oxybutynin (DITROPAN-XL) 10 MG 24 hr tablet Take 10 mg by mouth daily.    Yes [provider]  pantoprazole (PROTONIX) 40 MG tablet Take 1 tablet (40 mg total) by mouth 2 (two) times daily before a meal. Patient taking differently: Take 40 mg by mouth daily.  04/26/20  Yes Tiffany KocherLewis, Leslie S, PA-C  Polyethyl Glycol-Propyl Glycol (SYSTANE) 0.4-0.3 % SOLN Place 1 drop into both eyes every 12 (twelve) hours as needed (dry eyes).   Yes [provider]  polyethylene glycol (MIRALAX / GLYCOLAX) packet Take 17 g by mouth every 12 (twelve) hours as needed for moderate constipation.    Yes [provider]  potassium chloride (KLOR-CON) 10 MEQ tablet Take 10 mEq by mouth daily. 01/26/20  Yes [provider]  promethazine (PHENERGAN) 12.5 MG tablet Take 1 tablet (12.5 mg total) by mouth every 8 (eight) hours as needed for nausea or vomiting. No more than twice dialy. Patient taking differently: Take 12.5 mg by mouth every 8 (eight) hours as needed for nausea or vomiting.  04/26/20  Yes Tiffany KocherLewis, Leslie S, PA-C  Saline 0.2 % SOLN Place 1 spray into the nose every 4 (four) hours as needed (nasal dryness/irritation).    Yes [provider]  senna (SENOKOT) 8.6 MG TABS tablet Take 1 tablet by mouth every 12 (twelve) hours.   Yes [provider]  sitaGLIPtin (JANUVIA) 100 MG tablet Take 100 mg by mouth daily.   Yes [provider]  trazodone (DESYREL) 300 MG tablet Take 300 mg by mouth at bedtime.    Yes [provider]  AZO-CRANBERRY PO Take 1 tablet by mouth every 12 (twelve) hours.  Patient not taking: Reported on 07/19/2020     [provider]  Calcium Carbonate-Vitamin D3 (CALCIUM 600-D) 600-400 MG-UNIT TABS Take 1 tablet by mouth daily.  Patient not taking: Reported on 07/19/2020    [provider]  cefdinir (OMNICEF) 300 MG capsule Take 300 mg by mouth 2 (two) times daily. Patient not taking: Reported on 07/19/2020    [provider]  cholecalciferol (VITAMIN D3) 25 MCG (1000 UNIT) tablet Take 1,000 Units by mouth daily. Patient not taking: Reported on 07/19/2020    [provider]  ipratropium-albuterol (DUONEB) 0.5-2.5 (3) MG/3ML SOLN  12/13/17   [provider]  levocetirizine (XYZAL) 5 MG tablet Take 5 mg  by mouth every evening. Patient not taking: Reported on 07/19/2020    [provider]  loratadine (CLARITIN) 10 MG tablet Take 10 mg by mouth daily. Patient not taking: Reported on 07/19/2020    [provider]  mirtazapine (REMERON) 7.5 MG tablet Take 7.5 mg by mouth at bedtime. Patient not taking: Reported on 07/19/2020    [provider]  morphine (MS CONTIN) 15 MG 12 hr tablet Take 15 mg by mouth every 8 (eight) hours. Patient not taking: Reported on 07/19/2020    [provider]  naloxone [facility]) nasal spray 4 mg/0.1 mL Place 1 spray into the nose once.     [provider]  ondansetron (ZOFRAN) 4 MG tablet Take 4 mg by mouth every 12 (twelve) hours as needed for nausea or vomiting. Patient not taking: Reported on 07/19/2020    [provider]  Polyethylene Glycol 400 (BLINK TEARS) 0.25 % SOLN Apply to eye every 8 (eight) hours. Patient not taking: Reported on 07/19/2020    [provider]  potassium chloride (KLOR-CON SPRINKLE) 10 MEQ CR capsule Take 10 mEq by mouth daily.  Patient not taking: Reported on 07/19/2020 03/05/16   [provider]  Probiotic Product (PROBIOTIC ADVANCED) CAPS Take by mouth every morning. Patient not taking: Reported on 07/19/2020    [provider]    Allergies  as of 05/30/2020  . (No Known Allergies)    Family History  Problem Relation Age of Onset  . Cancer Father        unknown  . Colon cancer Neg Hx     Social History   Socioeconomic History  . Marital status: Divorced    Spouse name: Not on file  . Number of children: Not on file  . Years of education: Not on file  . Highest education level: Not on file  Occupational History  . Not on file  Tobacco Use  . Smoking status: Current Some Day Smoker    Packs/day: 0.50    Types: Cigarettes  . Smokeless tobacco: Never Used  . Tobacco comment: smokes 1/2 pack every other day  Vaping Use  . Vaping Use: Never used  Substance and Sexual Activity  . Alcohol use: No  . Drug use: No  . Sexual activity: Not on file  Other Topics Concern  . Not on file  Social History Narrative  . Not on file   Social Determinants of Health   Financial Resource Strain:   . Difficulty of Paying Living Expenses: Not on file  Food Insecurity:   . Worried About Programme researcher, broadcasting/film/video in the Last Year: Not on file  . Ran Out of Food in the Last Year: Not on file  Transportation Needs:   . Lack of Transportation (Medical): Not on file  . Lack of Transportation (Non-Medical): Not on file  Physical Activity:   . Days of Exercise per Week: Not on file  . Minutes of Exercise per Session: Not on file  Stress:   . Feeling of Stress : Not on file  Social Connections:   . Frequency of Communication with Friends and Family: Not on file  . Frequency of Social Gatherings with Friends and Family: Not on file  . Attends Religious Services: Not on file  . Active Member of Clubs or Organizations: Not on file  . Attends Banker Meetings: Not on file  . Marital Status: Not on file  Intimate Partner Violence:   . Fear of Current or  Ex-Partner: Not on file  . Emotionally Abused: Not on file  . Physically Abused: Not on file  . Sexually Abused: Not on file    Review of Systems: See HPI, otherwise  negative ROS  Physical Exam: BP 137/72   Pulse 68   Temp 98.4 F (36.9 C) (Oral)   Resp 12   SpO2 97%  General:   Alert,  , pleasant and cooperative in NAD SNeck:  Supple; no masses or thyromegaly. No significant cervical adenopathy. Lungs:  Clear throughout to auscultation.   No wheezes, crackles, or rhonchi. No acute distress. Heart:  Regular rate and rhythm; no murmurs, clicks, rubs,  or gallops. Abdomen: Non-distended, normal bowel sounds.  Soft and nontender without appreciable mass or hepatosplenomegaly.  Pulses:  Normal pulses noted. Extremities:  Without clubbing or edema.  Impression/Plan: 64 year old lady on multiple medications with ongoing epigastric pain nausea GERD and dysphagia.  Chronic meds include significant doses of narcotics.  I have offered the patient a EGD with esophageal dilation as feasible/appropriate per plan. The risks, benefits, limitations, alternatives and imponderables have been reviewed with the patient. Potential for esophageal dilation, biopsy, etc. have also been reviewed.  Questions have been answered. All parties agreeable.      Notice: This dictation was prepared with Dragon dictation along with smaller phrase technology. Any transcriptional errors that result from this process are unintentional and may not be corrected upon review.

## 2020-07-25 NOTE — Anesthesia Preprocedure Evaluation (Addendum)
Anesthesia Evaluation  Patient identified by MRN, date of birth, ID band Patient awake    Reviewed: Allergy & Precautions, NPO status , Patient's Chart, lab work & pertinent test results  History of Anesthesia Complications Negative for: history of anesthetic complications  Airway Mallampati: II  TM Distance: >3 FB Neck ROM: Full    Dental  (+) Edentulous Upper, Edentulous Lower   Pulmonary shortness of breath, pneumonia, COPD, Current Smoker,    Pulmonary exam normal breath sounds clear to auscultation       Cardiovascular Exercise Tolerance: Good hypertension, Pt. on medications Normal cardiovascular exam+ Valvular Problems/Murmurs  Rhythm:Regular Rate:Normal     Neuro/Psych  Headaches, PSYCHIATRIC DISORDERS Anxiety Depression  Neuromuscular disease    GI/Hepatic GERD  Medicated and Controlled,(+) Hepatitis -, C  Endo/Other    Renal/GU      Musculoskeletal  (+) Arthritis , Fibromyalgia -, narcotic dependent  Abdominal   Peds  Hematology  (+) anemia ,   Anesthesia Other Findings   Reproductive/Obstetrics                            Anesthesia Physical Anesthesia Plan  ASA: III  Anesthesia Plan: General   Post-op Pain Management:    Induction: Intravenous  PONV Risk Score and Plan: TIVA  Airway Management Planned: Nasal Cannula and Natural Airway  Additional Equipment:   Intra-op Plan:   Post-operative Plan:   Informed Consent: I have reviewed the patients History and Physical, chart, labs and discussed the procedure including the risks, benefits and alternatives for the proposed anesthesia with the patient or authorized representative who has indicated his/her understanding and acceptance.       Plan Discussed with: CRNA and Surgeon  Anesthesia Plan Comments:         Anesthesia Quick Evaluation

## 2020-07-25 NOTE — Op Note (Signed)
[facility] Patient Name: Gloria PlumSharon Chapman Procedure Date: 07/25/2020 12:10 PM MRN: [id number] Date of Birth: 06-22-1956 Attending MD: Gennette Pacobert Michael Jayln Branscom , MD CSN: [id number] Age: [id number] Admit Type: Outpatient Procedure:                Upper GI endoscopy Indications:              Dysphagia Providers:                Gennette Pacobert Michael Maegan Buller, MD, Jannett CelestineAnitra Bell, RN, Dyann Ruddleonya                            Wilson Referring MD:              Medicines:                Propofol per Anesthesia Complications:            No immediate complications. No immediate                            complications. Estimated blood loss: Minimal. Estimated Blood Loss:     Estimated blood loss was minimal. Procedure:                Pre-Anesthesia Assessment:                           - Prior to the procedure, a History and Physical                            was performed, and patient medications and                            allergies were reviewed. The patient's tolerance of                            previous anesthesia was also reviewed. The risks                            and benefits of the procedure and the sedation                            options and risks were discussed with the patient.                            All questions were answered, and informed consent                            was obtained. Prior Anticoagulants: The patient has                            taken no previous anticoagulant or antiplatelet                            agents. ASA Grade Assessment: III - A patient with  severe systemic disease. After reviewing the risks                            and benefits, the patient was deemed in                            satisfactory condition to undergo the procedure.                           After obtaining informed consent, the endoscope was                            passed under direct vision. Throughout the                            procedure, the patient's  blood pressure, pulse, and                            oxygen saturations were monitored continuously. The                            GIF-H190 ([id number]) scope was introduced through the                            mouth, and advanced to the second part of duodenum.                            The upper GI endoscopy was accomplished without                            difficulty. The patient tolerated the procedure                            well. Scope In: 12:31:48 PM Scope Out: 12:37:25 PM Total Procedure Duration: 0 hours 5 minutes 37 seconds  Findings:      Thrush in patient's oropharynx. Few scattered plaques in the proximal       esophagus. Tubular esophagus appeared in entirely patent throughout its       course; otherwise normal.      The entire examined stomach was normal.      The duodenal bulb and second portion of the duodenum were normal. Scope       removed. 56 French Maloney dilator was passed to full insertion easily.       A look back revealed no apparent complications to this maneuver. Impression:               -Oral candidiasis. Possible early Candida                            esophagitis. Status post [facility] dilation. Normal                            stomach.                           -  Normal duodenal bulb and second portion of the                            duodenum.                           - No specimens collected. Patient needs to be                            treated for oral candidiasis and possible early                            Candida esophagitis. Ongoing nausea likely not to                            significantly improve until when, and if, patient                            can come off some of her antimotility medications                            including opioids. She needs to be on a twice daily                            PPI. Currently she is not. Moderate Sedation:      Moderate (conscious) sedation was personally administered by an        anesthesia professional. The following parameters were monitored: oxygen       saturation, heart rate, blood pressure, respiratory rate, EKG, adequacy       of pulmonary ventilation, and response to care. Recommendation:           - Patient has a contact number available for                            emergencies. The signs and symptoms of potential                            delayed complications were discussed with the                            patient. Return to normal activities tomorrow.                            Written discharge instructions were provided to the                            patient. Continue current medication regimen.                            Increase Protonix to 40 mg twice daily. Diflucan                            orally 200 mg now followed by 100 mg daily x7 days.  Office visit with [place] in 3 months Procedure Code(s):        --- Professional ---                           [phone], Esophagogastroduodenoscopy, flexible,                            transoral; diagnostic, including collection of                            specimen(s) by brushing or washing, when performed                            (separate procedure) Diagnosis Code(s):        --- Professional ---                           R13.10, Dysphagia, unspecified CPT copyright 2019 American Medical Association. All rights reserved. The codes documented in this report are preliminary and upon coder review may  be revised to meet current compliance requirements. Gerrit Friends. Deasia Chiu, MD Gennette Pac, MD 07/25/2020 12:51:50 PM This report has been signed electronically. Number of Addenda: 0

## 2020-07-29 ENCOUNTER — Encounter (HOSPITAL_COMMUNITY): Payer: Self-pay | Admitting: Internal Medicine

## 2020-08-10 DIAGNOSIS — B182 Chronic viral hepatitis C: Secondary | ICD-10-CM | POA: Diagnosis not present

## 2020-08-10 DIAGNOSIS — D649 Anemia, unspecified: Secondary | ICD-10-CM | POA: Diagnosis not present

## 2020-08-10 DIAGNOSIS — R7989 Other specified abnormal findings of blood chemistry: Secondary | ICD-10-CM | POA: Diagnosis not present

## 2020-08-10 DIAGNOSIS — Z79899 Other long term (current) drug therapy: Secondary | ICD-10-CM | POA: Diagnosis not present

## 2020-08-15 DIAGNOSIS — R262 Difficulty in walking, not elsewhere classified: Secondary | ICD-10-CM | POA: Diagnosis not present

## 2020-08-15 DIAGNOSIS — M6281 Muscle weakness (generalized): Secondary | ICD-10-CM | POA: Diagnosis not present

## 2020-08-15 DIAGNOSIS — I509 Heart failure, unspecified: Secondary | ICD-10-CM | POA: Diagnosis not present

## 2020-08-15 DIAGNOSIS — J309 Allergic rhinitis, unspecified: Secondary | ICD-10-CM | POA: Diagnosis not present

## 2020-08-22 DIAGNOSIS — B351 Tinea unguium: Secondary | ICD-10-CM | POA: Diagnosis not present

## 2020-08-22 DIAGNOSIS — E1159 Type 2 diabetes mellitus with other circulatory complications: Secondary | ICD-10-CM | POA: Diagnosis not present

## 2020-08-25 DIAGNOSIS — I509 Heart failure, unspecified: Secondary | ICD-10-CM | POA: Diagnosis not present

## 2020-08-25 DIAGNOSIS — F339 Major depressive disorder, recurrent, unspecified: Secondary | ICD-10-CM | POA: Diagnosis not present

## 2020-08-25 DIAGNOSIS — E785 Hyperlipidemia, unspecified: Secondary | ICD-10-CM | POA: Diagnosis not present

## 2020-08-25 DIAGNOSIS — J441 Chronic obstructive pulmonary disease with (acute) exacerbation: Secondary | ICD-10-CM | POA: Diagnosis not present

## 2020-08-25 DIAGNOSIS — E1169 Type 2 diabetes mellitus with other specified complication: Secondary | ICD-10-CM | POA: Diagnosis not present

## 2020-08-25 DIAGNOSIS — J449 Chronic obstructive pulmonary disease, unspecified: Secondary | ICD-10-CM | POA: Diagnosis not present

## 2020-08-25 DIAGNOSIS — E559 Vitamin D deficiency, unspecified: Secondary | ICD-10-CM | POA: Diagnosis not present

## 2020-08-25 DIAGNOSIS — F329 Major depressive disorder, single episode, unspecified: Secondary | ICD-10-CM | POA: Diagnosis not present

## 2020-08-27 DIAGNOSIS — E119 Type 2 diabetes mellitus without complications: Secondary | ICD-10-CM | POA: Diagnosis not present

## 2020-08-27 DIAGNOSIS — J449 Chronic obstructive pulmonary disease, unspecified: Secondary | ICD-10-CM | POA: Diagnosis not present

## 2020-08-27 DIAGNOSIS — R296 Repeated falls: Secondary | ICD-10-CM | POA: Diagnosis not present

## 2020-08-27 DIAGNOSIS — R52 Pain, unspecified: Secondary | ICD-10-CM | POA: Diagnosis not present

## 2020-08-27 DIAGNOSIS — K5900 Constipation, unspecified: Secondary | ICD-10-CM | POA: Diagnosis not present

## 2020-08-27 DIAGNOSIS — R1084 Generalized abdominal pain: Secondary | ICD-10-CM | POA: Diagnosis not present

## 2020-08-27 DIAGNOSIS — Z8614 Personal history of Methicillin resistant Staphylococcus aureus infection: Secondary | ICD-10-CM | POA: Diagnosis not present

## 2020-08-27 DIAGNOSIS — Z720 Tobacco use: Secondary | ICD-10-CM | POA: Diagnosis not present

## 2020-08-27 DIAGNOSIS — Z7401 Bed confinement status: Secondary | ICD-10-CM | POA: Diagnosis not present

## 2020-08-27 DIAGNOSIS — T8451XA Infection and inflammatory reaction due to internal right hip prosthesis, initial encounter: Secondary | ICD-10-CM | POA: Diagnosis not present

## 2020-08-29 DIAGNOSIS — R112 Nausea with vomiting, unspecified: Secondary | ICD-10-CM | POA: Diagnosis not present

## 2020-08-29 DIAGNOSIS — R100 Acute abdomen: Secondary | ICD-10-CM | POA: Diagnosis not present

## 2020-09-02 DIAGNOSIS — H6993 Unspecified Eustachian tube disorder, bilateral: Secondary | ICD-10-CM | POA: Diagnosis not present

## 2020-09-02 DIAGNOSIS — J019 Acute sinusitis, unspecified: Secondary | ICD-10-CM | POA: Diagnosis not present

## 2020-09-11 IMAGING — CT CT ABD-PELV W/ CM
2 of 10 series · 11 of 46 positions shown, 17 images · IV contrast (Omnipaque or Isovue)
Comparison: CT pelvis from 05/15/2015 and CT abdomen from
01/25/2015 (both from [REDACTED])

CLINICAL DATA: Epigastric pain with intermittent vomiting for 2
months. Chronic hepatitis-C.

EXAM:
CT ABDOMEN AND PELVIS WITH CONTRAST
TECHNIQUE: Multidetector CT imaging of the abdomen and pelvis was performed
using the standard protocol following bolus administration of
intravenous contrast.
CONTRAST:  100mL OMNIPAQUE IOHEXOL 300 MG/ML  SOLN

[Series 3: axial venous · axial · portal-venous · 0.88mm/px · z∈[+1053,+1413]mm · 8 of 156 slices shown, 13 images]
[im 18/156  soft-tissue]
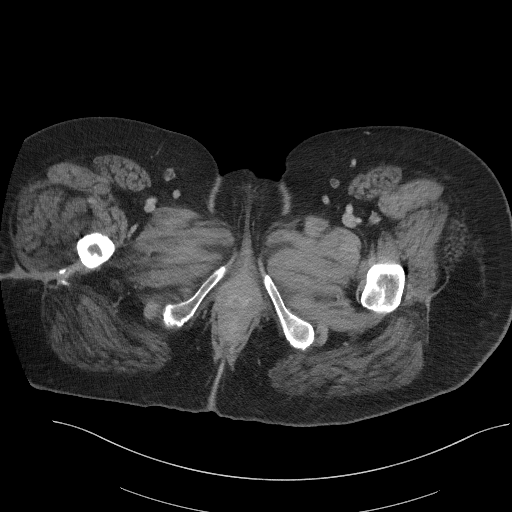
[im 18/156  bone]
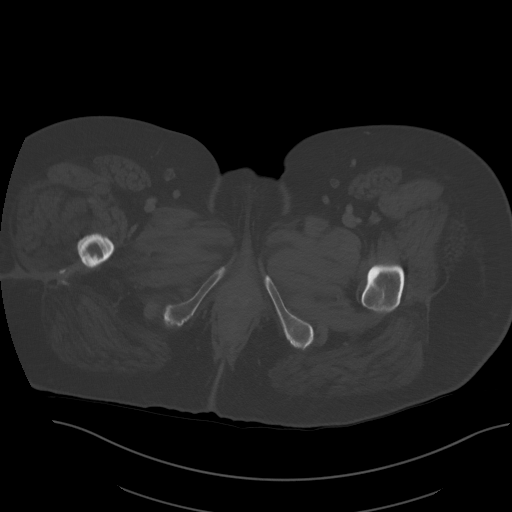
[im 35/156  soft-tissue]
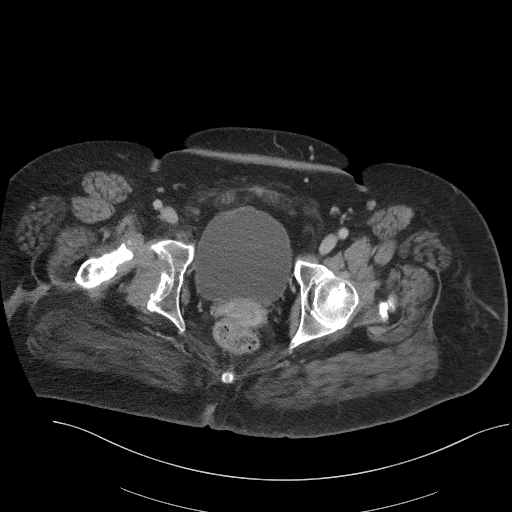
[im 52/156  soft-tissue]
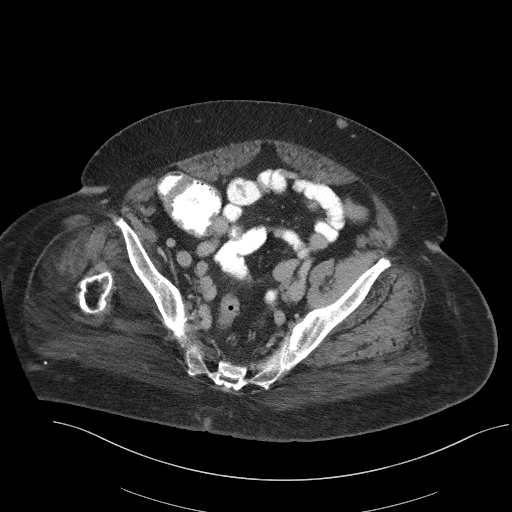
[im 69/156  soft-tissue]
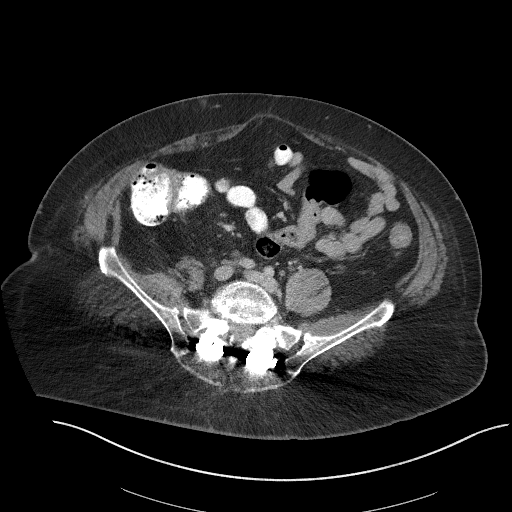
[im 87/156  soft-tissue]
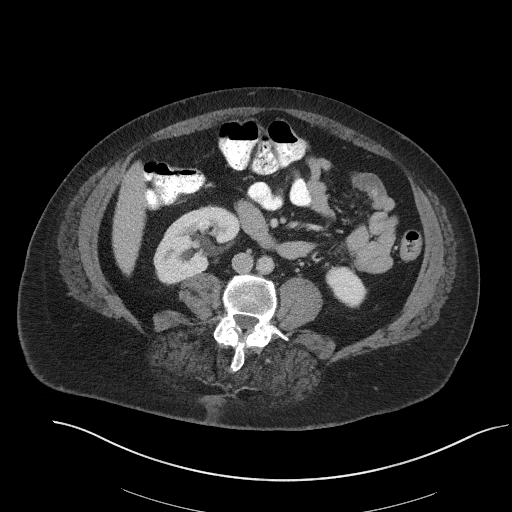
[im 87/156  lung]
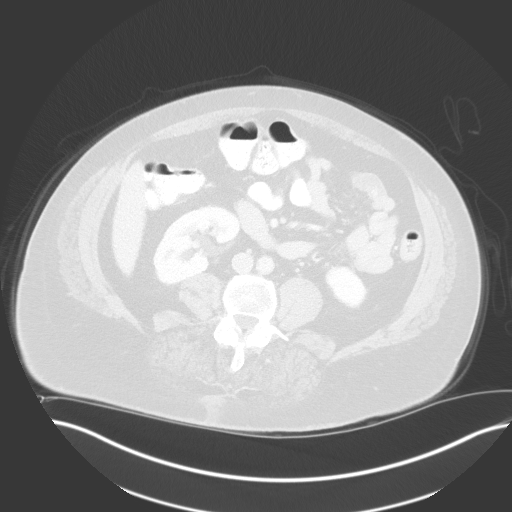
[im 104/156  soft-tissue]
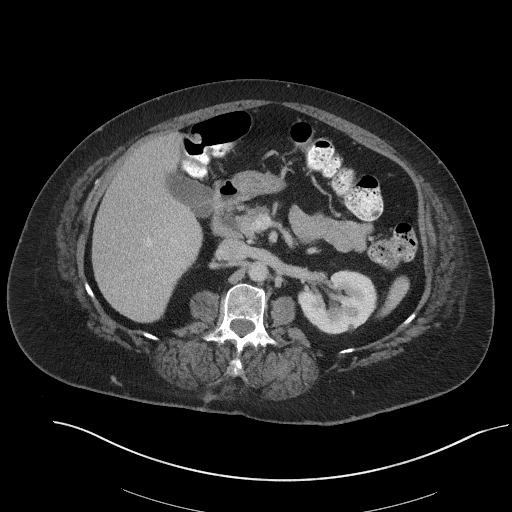
[im 104/156  lung]
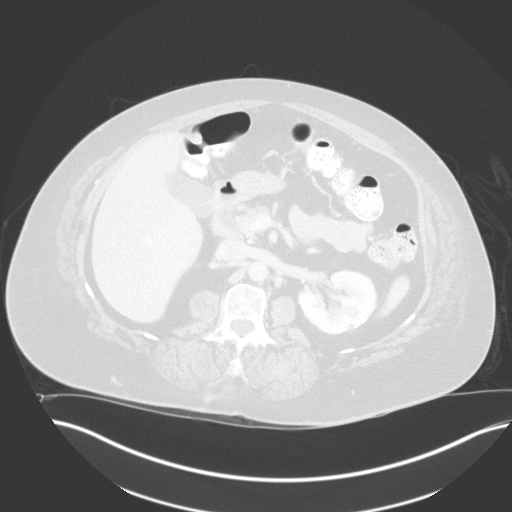
[im 121/156  soft-tissue]
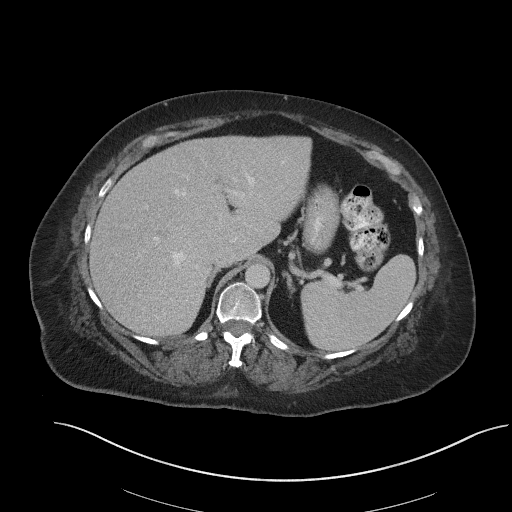
[im 121/156  lung]
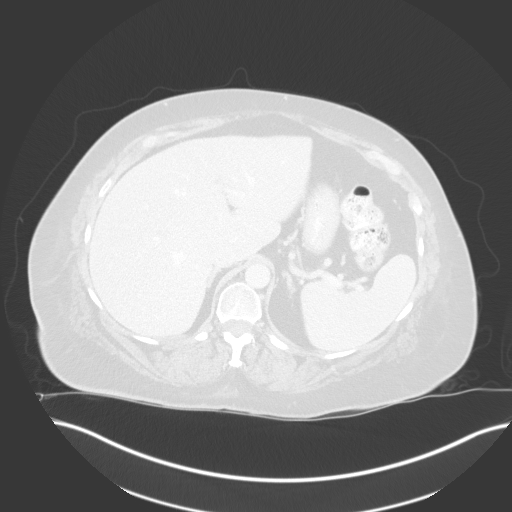
[im 138/156  soft-tissue]
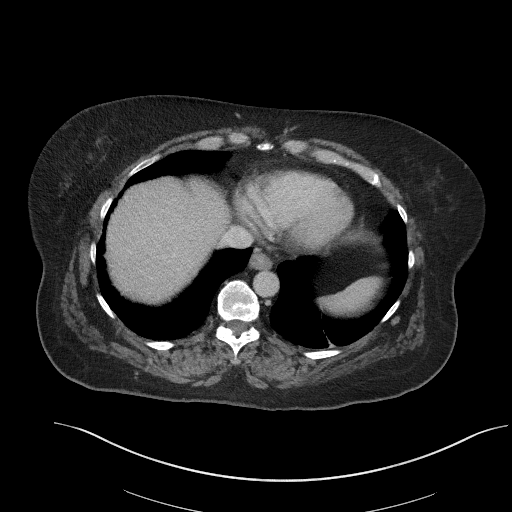
[im 138/156  lung]
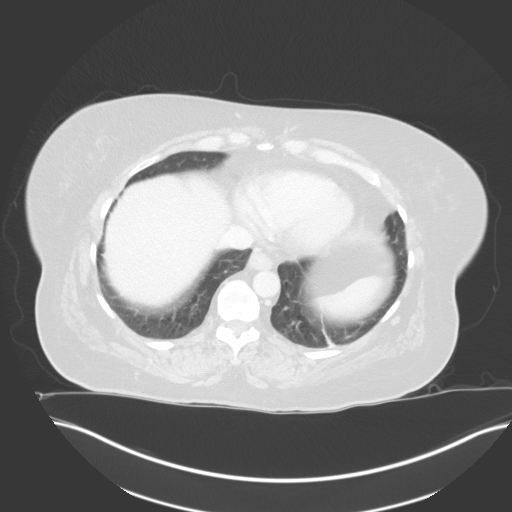

[Series 12: coronal venous · coronal · portal-venous · 0.86mm/px · 3 of 101 slices shown, 4 images]
[im 26/101  soft-tissue]
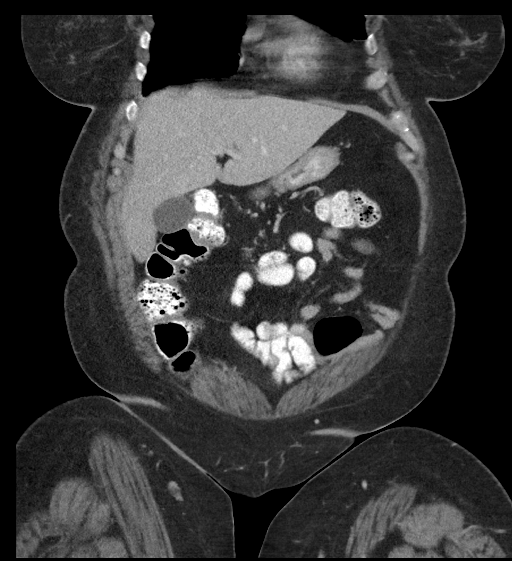
[im 51/101  soft-tissue]
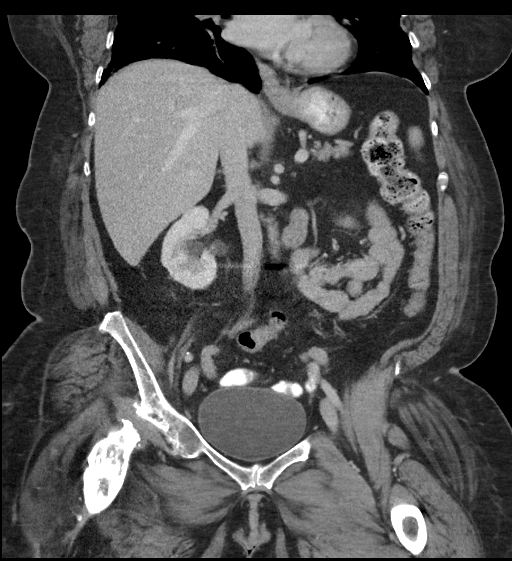
[im 51/101  bone]
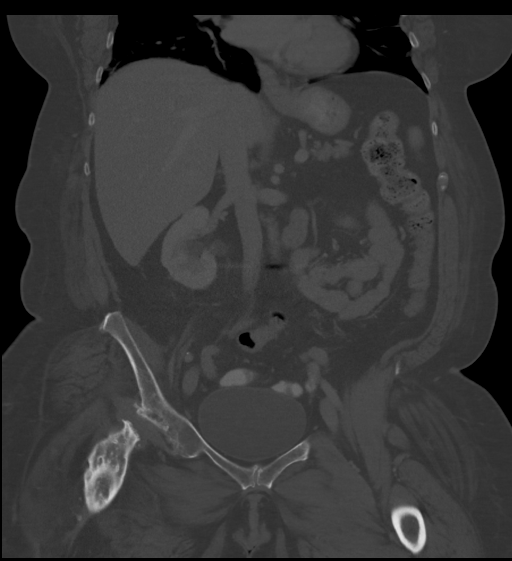
[im 76/101  soft-tissue]
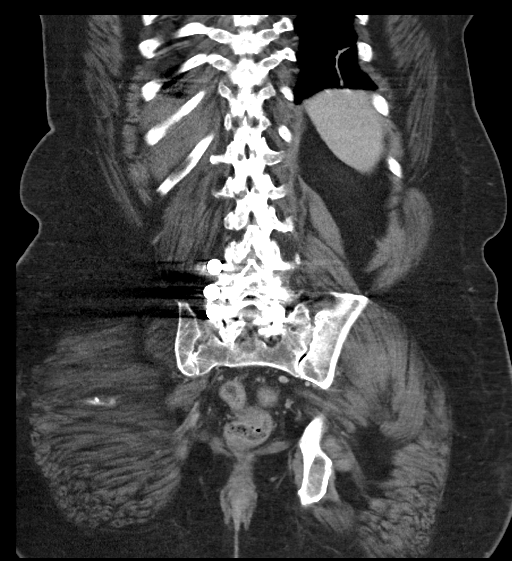

[11 of 46 positions shown; findings below may reference images not displayed]

FINDINGS: Lower chest: Bibasilar atelectasis versus scarring. Stable mild
distal esophageal wall thickening, esophagitis would be a common
cause. Mild descending thoracic aortic atherosclerotic
calcification.

Hepatobiliary: No significant abnormal arterial phase enhancement in
the liver to suggest hepatocellular carcinoma. There is some mild
prominence of the caudate lobe which can be a morphologic indicator
of early cirrhosis, but no overt nodularity of the liver is
observed. The gallbladder appears unremarkable. The common hepatic
duct is about 0.8 cm in diameter with the common bile duct about
cm in diameter, borderline dilated.

Pancreas: Unremarkable

Spleen: Unremarkable

Adrenals/Urinary Tract: 0.5 cm hypodense lesion of the left mid
kidney posteriorly on image 53/3 is likely cysts but technically too
small to characterize. The adrenal glands appear normal. Urinary
bladder unremarkable.

Stomach/Bowel: Unremarkable

Vascular/Lymphatic: Mild abdominal aortic atherosclerotic
calcification near the bifurcation, with atherosclerosis of the
iliac arteries. No pathologic adenopathy identified.

Reproductive: Unremarkable

Other: No supplemental non-categorized findings.

Musculoskeletal: Interval removal of the right hip prosthesis, with
cephalad migration of the right femur. Absent right femoral neck and
head. Irregular and flattened right acetabular region. Mildly
accentuated but otherwise nonspecific density along the left
pectineus muscle. Posterolateral rod and pedicle screw fixation at
L4-L5-S1 bilaterally. Chronic appearing compression fractures at L1
and L3.
IMPRESSION: 1. A specific cause for the patient's epigastric pain and vomiting
is not identified. There is some mild prominence of the caudate lobe
of the liver which can be a morphologic indicator of early
cirrhosis, but no overt nodularity of the liver is observed.
2. Stable mild distal esophageal wall thickening, esophagitis would
be a common cause.
3. Interval removal of the right hip prosthesis, with cephalad
migration of the right femur. Absent right femoral neck and head.
Irregular and flattened right acetabular region. Mildly accentuated
but otherwise nonspecific density along the left pectineus muscle.
4. Chronic compression fractures at L1 and L3.

Aortic Atherosclerosis ([id number]).

## 2020-09-13 DIAGNOSIS — F419 Anxiety disorder, unspecified: Secondary | ICD-10-CM | POA: Diagnosis not present

## 2020-09-13 DIAGNOSIS — M6281 Muscle weakness (generalized): Secondary | ICD-10-CM | POA: Diagnosis not present

## 2020-09-13 DIAGNOSIS — K219 Gastro-esophageal reflux disease without esophagitis: Secondary | ICD-10-CM | POA: Diagnosis not present

## 2020-09-13 DIAGNOSIS — R262 Difficulty in walking, not elsewhere classified: Secondary | ICD-10-CM | POA: Diagnosis not present

## 2020-09-13 DIAGNOSIS — B182 Chronic viral hepatitis C: Secondary | ICD-10-CM | POA: Diagnosis not present

## 2020-09-13 DIAGNOSIS — I509 Heart failure, unspecified: Secondary | ICD-10-CM | POA: Diagnosis not present

## 2020-09-13 DIAGNOSIS — E1169 Type 2 diabetes mellitus with other specified complication: Secondary | ICD-10-CM | POA: Diagnosis not present

## 2020-09-13 DIAGNOSIS — J309 Allergic rhinitis, unspecified: Secondary | ICD-10-CM | POA: Diagnosis not present

## 2020-09-13 DIAGNOSIS — E559 Vitamin D deficiency, unspecified: Secondary | ICD-10-CM | POA: Diagnosis not present

## 2020-09-21 ENCOUNTER — Ambulatory Visit (INDEPENDENT_AMBULATORY_CARE_PROVIDER_SITE_OTHER): Payer: Medicare Other | Admitting: Adult Health

## 2020-09-21 ENCOUNTER — Other Ambulatory Visit: Payer: Self-pay

## 2020-09-21 ENCOUNTER — Other Ambulatory Visit (HOSPITAL_COMMUNITY)
Admission: RE | Admit: 2020-09-21 | Discharge: 2020-09-21 | Disposition: A | Discharge status: Home / Self Care | Payer: Medicare Other | Source: Ambulatory Visit | Attending: Adult Health | Admitting: Adult Health

## 2020-09-21 ENCOUNTER — Encounter: Payer: Self-pay | Admitting: Adult Health

## 2020-09-21 VITALS — BP 140/79 | HR 81

## 2020-09-21 DIAGNOSIS — Z124 Encounter for screening for malignant neoplasm of cervix: Secondary | ICD-10-CM | POA: Insufficient documentation

## 2020-09-21 DIAGNOSIS — Z1151 Encounter for screening for human papillomavirus (HPV): Secondary | ICD-10-CM | POA: Insufficient documentation

## 2020-09-21 DIAGNOSIS — R102 Pelvic and perineal pain: Secondary | ICD-10-CM | POA: Diagnosis not present

## 2020-09-21 NOTE — Addendum Note (Signed)
Addended by: Malachy Mood S on: 09/21/2020 04:00 PM   Modules accepted: Orders

## 2020-09-21 NOTE — Progress Notes (Signed)
Patient ID: Gloria Chapman, female   DOB: 09-03-1956, 65 y.o.   MRN: [id number] History of Present Illness: Gloria Chapman is a 65 year old white female,divorced, PM in complaining of pelvic pain and had CT in [place] and said uterus is thick, about 3 weeks ago she says.  She says she wants a hysterectomy.  She resides at [facility] in [place]. PCP is Dr Brooke Dare     Current Medications, Allergies, Past Medical History, Past Surgical History, Family History and Social History were reviewed in [employer] electronic medical record.     Review of Systems: +pelvic pain for over 2 months, had CT at [facility] and she said uterus is thick Denies any vaginal bleeding  Denies any vaginal discharge or itching She is not sexually active    Physical Exam:BP 140/79 (BP Location: Left Arm, Patient Position: Sitting, Cuff Size: Large)   Pulse 81  General:  Well developed, well nourished, no acute distress Skin:  Warm and dry Lungs; Clear to auscultation bilaterally Cardiovascular: Regular rate and rhythm Abdomen:  Soft, non tender, obese Pelvic:  External genitalia is normal in appearance, no lesions.  The vagina is pale and dry. Urethra has no lesions or masses. The cervix is not visualized, but a pap with HRHPV genotyping performed, by blind swipe.  Uterus is difficult to feel due to body habitus but she says it is tender.  No adnexal masses or tenderness noted.Bladder is non tender, no masses felt. AA is 0 Fall risk is low, but she is in wheelchair PHQ 9 score is 6 GAD 7 score is 5   [facility] - 09/21/20 1516      Pregnancy Intention Screening   Does the patient want to become pregnant in the next year? N/A    Does the patient's partner want to become pregnant in the next year? N/A    Would the patient like to discuss contraceptive options today? N/A      Contraception Wrap Up   Current Method Abstinence    End Method Abstinence    Contraception Counseling Provided No          Examination chaperoned by Malachy Mood LPN   Impression and Plan  1. Pelvic pain Request copy of CT from [facility] was not on Care everywhere  Will get pelvic US to better assess uterus  Will get Pelvic US at [facility] 09/26/20 at 11:30 am be there at 11:15 am with full bladder, Daisy to check on precert.  We will talk when results back  2. Routine Papanicolaou smear Pap sent

## 2020-09-26 ENCOUNTER — Ambulatory Visit (HOSPITAL_COMMUNITY): Payer: Medicare Other

## 2020-09-27 ENCOUNTER — Other Ambulatory Visit: Payer: Self-pay | Admitting: Adult Health

## 2020-09-27 ENCOUNTER — Ambulatory Visit (HOSPITAL_COMMUNITY)
Admission: RE | Admit: 2020-09-27 | Discharge: 2020-09-27 | Disposition: A | Discharge status: Home / Self Care | Payer: Medicare Other | Source: Ambulatory Visit | Attending: Adult Health | Admitting: Adult Health

## 2020-09-27 ENCOUNTER — Other Ambulatory Visit: Payer: Self-pay

## 2020-09-27 DIAGNOSIS — R102 Pelvic and perineal pain: Secondary | ICD-10-CM | POA: Insufficient documentation

## 2020-09-28 LAB — CYTOLOGY - PAP
Comment: NEGATIVE
Diagnosis: NEGATIVE
High risk HPV: NEGATIVE

## 2020-09-29 ENCOUNTER — Telehealth: Payer: Self-pay | Admitting: Adult Health

## 2020-09-29 NOTE — Telephone Encounter (Signed)
[facility], and nurse never came to phone, if they call back I need to tell them about [place]

## 2020-10-04 ENCOUNTER — Encounter: Payer: Self-pay | Admitting: Adult Health

## 2020-10-04 ENCOUNTER — Telehealth: Payer: Self-pay | Admitting: Adult Health

## 2020-10-04 NOTE — Telephone Encounter (Signed)
No answer

## 2020-10-25 ENCOUNTER — Encounter: Payer: Self-pay | Admitting: Internal Medicine

## 2020-10-25 ENCOUNTER — Other Ambulatory Visit: Payer: Self-pay

## 2020-10-25 ENCOUNTER — Ambulatory Visit (INDEPENDENT_AMBULATORY_CARE_PROVIDER_SITE_OTHER): Payer: Medicare Other | Admitting: Internal Medicine

## 2020-10-25 VITALS — BP 140/84 | HR 87 | Temp 97.8°F | Ht 62.0 in | Wt 209.0 lb

## 2020-10-25 DIAGNOSIS — K3184 Gastroparesis: Secondary | ICD-10-CM

## 2020-10-25 DIAGNOSIS — R112 Nausea with vomiting, unspecified: Secondary | ICD-10-CM | POA: Diagnosis not present

## 2020-10-25 DIAGNOSIS — E1143 Type 2 diabetes mellitus with diabetic autonomic (poly)neuropathy: Secondary | ICD-10-CM | POA: Diagnosis not present

## 2020-10-25 DIAGNOSIS — K219 Gastro-esophageal reflux disease without esophagitis: Secondary | ICD-10-CM | POA: Diagnosis not present

## 2020-10-25 MED ORDER — METOCLOPRAMIDE HCL 5 MG PO TABS: ORAL_TABLET | ORAL | 1 refills | Status: DC

## 2020-10-25 NOTE — Progress Notes (Signed)
Primary Care Physician:  Audree Bane, DO Primary Gastroenterologist:  Dr. Jena Gauss  Pre-Procedure History & Physical: HPI:  Gloria Chapman is a 65 y.o. female here for follow-up dysphagia,  Candida esophagitis and nausea.  Patient has delayed gastric emptying on 4 hour study.  Marked delay.  Gastroparesis likely secondary to combination of potent opioid therapy and anticholinergics (oxybutynin).  She does have relatively good bowel function.  She is often nauseated and heaves.  Some upper abdominal soreness from time to time.  Typical reflux symptoms fairly well controlled on Protonix 40 mg twice daily.  No dysphagia now.  Weight has been relatively stable. Recent EGD with [facility] dilation seem to have helped.  She did take treatment for Candida esophagitis.  He is on low-dose Phenergan. Denies ever being tried on Reglan.  Partial treatment for hepatitis C previously.  1 PCR assay (done outside) demonstrated no evidence of persisting viremia.  Timeline uncertain based on the record.   Past Medical History:  Diagnosis Date  . Anemia   . Anxiety   . Arthritis   . COPD (chronic obstructive pulmonary disease) (HCC)   . Depression   . Diabetes mellitus without complication (HCC)   . Dyspnea   . Fibromyalgia   . GERD (gastroesophageal reflux disease)   . Headache   . Heart failure (HCC)   . Heart murmur   . Hepatitis   . History of Clostridium difficile infection   . History of methicillin resistant staphylococcus aureus (MRSA)   . Insomnia   . Overactive bladder   . Pneumonia     Past Surgical History:  Procedure Laterality Date  . APPENDECTOMY    . BACK SURGERY     twice  . DIAGNOSTIC LAPAROSCOPY     checking for endometriosis  . ESOPHAGOGASTRODUODENOSCOPY (EGD) WITH PROPOFOL N/A 11/21/2017   Dr. Jena Gauss: Erosive reflux esophagitis, low-grade narrowing Schatzki ring status post dilation, small hiatal hernia.  Marland Kitchen ESOPHAGOGASTRODUODENOSCOPY (EGD) WITH PROPOFOL N/A 07/25/2020    Procedure: ESOPHAGOGASTRODUODENOSCOPY (EGD) WITH PROPOFOL;  Surgeon: Corbin Ade, MD;  [facility];  Service: Endoscopy;  Laterality: N/A;  12:30pm. patient will arrive 3 hrs prior for rapid test since in nursing facility  . FEMUR FRACTURE SURGERY Right    Rods placed  . HEMORRHOID SURGERY    . INNER EAR SURGERY Left   . MALONEY DILATION N/A 11/21/2017   Procedure: Elease Hashimoto DILATION;  Surgeon: Corbin Ade, MD;  [facility];  Service: Endoscopy;  Laterality: N/A;  . Elease Hashimoto DILATION N/A 07/25/2020   Procedure: Elease Hashimoto DILATION;  Surgeon: Corbin Ade, MD;  [facility];  Service: Endoscopy;  Laterality: N/A;  . NASAL SINUS SURGERY    . right hip surgery     [place]: "took my hip out due to MRSA" occurred after hip replacements    Prior to Admission medications   Medication Sig Start Date End Date Taking? Authorizing Provider  acetaminophen (TYLENOL) 500 MG tablet Take 1,000 mg by mouth every 8 (eight) hours as needed.    [provider]  albuterol (VENTOLIN HFA) 108 (90 Base) MCG/ACT inhaler Inhale 2 puffs into the lungs every 4 (four) hours as needed for wheezing or shortness of breath. 05/30/20   [provider]  aspirin EC 81 MG tablet Take 81 mg by mouth daily. Swallow whole.    [provider]  azelastine (OPTIVAR) 0.05 % ophthalmic solution 1 drop 2 (two) times daily.    [provider]  Cranberry 125 MG TABS Take 125 mg by mouth every 12 (twelve) hours.    [provider]  diclofenac Sodium (VOLTAREN) 1 % GEL Apply 2 g topically 2 (two) times daily.     [provider]  diphenhydrAMINE (BENADRYL) 25 MG tablet Take 25 mg by mouth every 6 (six) hours as needed for itching.     [provider]  DULoxetine (CYMBALTA) 30 MG capsule Take 30 mg by mouth daily.    [provider]  DULoxetine (CYMBALTA) 60 MG capsule Take 60 mg by mouth daily.    [provider]  fluticasone  furoate-vilanterol (BREO ELLIPTA) 100-25 MCG/INH AEPB Inhale 1 puff into the lungs daily.    [provider]  furosemide (LASIX) 40 MG tablet Take 40 mg by mouth daily.    [provider]  Glucagon HCl 1 MG SOLR Inject 1 mg as directed daily as needed (low blood sugar).     [provider]  guaiFENesin (MUCINEX) 600 MG 12 hr tablet Take 600 mg by mouth daily.    [provider]  HYDROmorphone (DILAUDID) 2 MG tablet Take 2 mg by mouth every 12 (twelve) hours as needed (breakthrough pain).     [provider]  hydrOXYzine (ATARAX/VISTARIL) 25 MG tablet Take 25 mg by mouth 4 (four) times daily.    [provider]  insulin glargine (LANTUS) 100 UNIT/ML injection Inject 30 Units into the skin every evening.     [provider]  insulin lispro (HUMALOG) 100 UNIT/ML KwikPen Inject 0-10 Units into the skin See admin instructions. Inject as per sliding scale, blood sugar 60-200=0 units, 201-250= 2 units, 251-300=4 units, 301-350=6 units, 351-400=8 units, 401-450=10 units. If greater than 450 or less that 60 call onsite.    [provider]  LORazepam (ATIVAN) 1 MG tablet Take 1 mg by mouth 3 (three) times daily.     [provider]  Melatonin 5 MG TABS Take 10 mg by mouth at bedtime.     [provider]  morphine (MS CONTIN) 30 MG 12 hr tablet Take 30 mg by mouth in the morning and at bedtime.  02/18/18   [provider]  naloxone [facility]) nasal spray 4 mg/0.1 mL Place 1 spray into the nose once.     [provider]  oxybutynin (DITROPAN-XL) 10 MG 24 hr tablet Take 10 mg by mouth daily.     [provider]  pantoprazole (PROTONIX) 40 MG tablet Take 1 tablet (40 mg total) by mouth 2 (two) times daily before a meal. Patient taking differently: Take 40 mg by mouth 2 (two) times daily. 04/26/20   Tiffany Kocher, PA-C  Polyethyl Glycol-Propyl Glycol (SYSTANE) 0.4-0.3 % SOLN Place 1 drop into both eyes  every 12 (twelve) hours as needed (dry eyes).    [provider]  polyethylene glycol (MIRALAX / GLYCOLAX) packet Take 17 g by mouth every 12 (twelve) hours as needed for moderate constipation.     [provider]  potassium chloride (KLOR-CON) 10 MEQ tablet Take 10 mEq by mouth daily. 01/26/20   [provider]  promethazine (PHENERGAN) 12.5 MG tablet Take 1 tablet (12.5 mg total) by mouth every 8 (eight) hours as needed for nausea or vomiting. No more than twice dialy. Patient taking differently: Take 12.5 mg by mouth every 6 (six) hours as needed for nausea or vomiting. 04/26/20   Tiffany Kocher, PA-C  Saline 0.2 % SOLN Place 1 spray into  the nose every 4 (four) hours as needed (nasal dryness/irritation).     [provider]  senna (SENOKOT) 8.6 MG TABS tablet Take 1 tablet by mouth every 12 (twelve) hours.    [provider]  sitaGLIPtin (JANUVIA) 100 MG tablet Take 100 mg by mouth daily.    [provider]  Tiotropium Bromide Monohydrate (SPIRIVA RESPIMAT) 2.5 MCG/ACT AERS Inhale 2 puffs into the lungs daily.    [provider]  trazodone (DESYREL) 300 MG tablet Take 300 mg by mouth at bedtime.     [provider]  witch hazel-glycerin (TUCKS) pad Apply 1 application topically as needed for itching or irritation.    [provider]    Allergies as of 10/25/2020  . (No Known Allergies)    Family History  Problem Relation Age of Onset  . Cancer Father        unknown  . Cancer Mother   . Colon cancer Neg Hx     Social History   Socioeconomic History  . Marital status: Divorced    Spouse name: Not on file  . Number of children: Not on file  . Years of education: Not on file  . Highest education level: Not on file  Occupational History  . Not on file  Tobacco Use  . Smoking status: Current Every Day Smoker    Packs/day: 0.50    Types: Cigarettes  . Smokeless tobacco: Never Used  . Tobacco comment:  smokes 8 cig daily  Vaping Use  . Vaping Use: Never used  Substance and Sexual Activity  . Alcohol use: No  . Drug use: No  . Sexual activity: Not Currently    Birth control/protection: Post-menopausal  Other Topics Concern  . Not on file  Social History Narrative  . Not on file   Social Determinants of Health   Financial Resource Strain: Low Risk   . Difficulty of Paying Living Expenses: Not hard at all  Food Insecurity: No Food Insecurity  . Worried About Programme researcher, broadcasting/film/video in the Last Year: Never true  . Ran Out of Food in the Last Year: Never true  Transportation Needs: No Transportation Needs  . Lack of Transportation (Medical): No  . Lack of Transportation (Non-Medical): No  Physical Activity: Inactive  . Days of Exercise per Week: 0 days  . Minutes of Exercise per Session: 0 min  Stress: No Stress Concern Present  . Feeling of Stress : Only a little  Social Connections: Socially Isolated  . Frequency of Communication with Friends and Family: Twice a week  . Frequency of Social Gatherings with Friends and Family: Never  . Attends Religious Services: Never  . Active Member of Clubs or Organizations: No  . Attends Banker Meetings: Never  . Marital Status: Divorced  Catering manager Violence: Not At Risk  . Fear of Current or Ex-Partner: No  . Emotionally Abused: No  . Physically Abused: No  . Sexually Abused: No    Review of Systems: See HPI, otherwise negative ROS  Physical Exam: BP 140/84   Pulse 87   Temp 97.8 F (36.6 C) (Temporal)   Ht 5\' 2"  (1.575 m)   Wt 209 lb (94.8 kg)   BMI 38.23 kg/m  General: Debilitated, chronically ill wheelchair-bound obese lady resting comfortably.  Accompanied by her driver,  Coralee Rud.  Neck:  Supple; no masses or thyromegaly. No significant cervical adenopathy. Lungs:  Clear throughout to auscultation.   No wheezes, crackles, or rhonchi.  No acute distress. Heart:  Regular rate and rhythm; no murmurs, clicks,  rubs,  or gallops. Abdomen: Obese.  Positive bowel sounds soft nontender.  No obvious succussion splash.    Impression/Plan: 65 year old lady with chronic nausea and intermittent heaving in the setting of known gastroparesis secondary to longstanding diabetes and chronic opioid/anticholinergic medication use.   It is highly unlikely she will come off of opioids anytime soon. As long as she continues on these medications,  she would likely have a symptomatic gastroparesis.  At this point, feel we need to try another strategy i.e. adding low-dose Reglan to her regimen.  Had a lengthy discussion with the patient regarding Reglan including the risks of dystonia and tardive dyskinesia.  Side effects were explained in detail.   Recommendations:  Gastroparesis diet-multiple small meals daily 5-6.  Low-fat complex carbohydrates, low fiber  Continue Protonix 40 mg twice daily  Begin Reglan 5 mg before meals and at bedtime (limit to 3 times daily).  Dispense 90 with 1 refill)  If patient experiences any potential side effects of Reglan she is to stop this medication let me know  Office visit with [place] in 6 weeks.   Addendum: We will go ahead and get 1 more HCV PCR assay to document SVR    Notice: This dictation was prepared with Dragon dictation along with smaller phrase technology. Any transcriptional errors that result from this process are unintentional and may not be corrected upon review.

## 2020-10-25 NOTE — Patient Instructions (Addendum)
Gastroparesis diet information provided-5-6 small meals daily.  Low-fat, complex carbohydrates, low fiber.  Continue Protonix 40 mg twice daily  Begin Reglan 5 mg 20 minutes before breakfast and at bedtime (limit to 3 times daily) dispense 90 with 1 refill  If any side effects of Reglan are noted, stop the medication and call me.  Office visit with [place] in 6 weeks  Lets get a hepatitis C PCR done to document sustained virological response from prior treatment of hepatitis C.

## 2020-11-17 ENCOUNTER — Encounter: Payer: Self-pay | Admitting: Pulmonary Disease

## 2020-11-17 ENCOUNTER — Other Ambulatory Visit: Payer: Self-pay

## 2020-11-17 ENCOUNTER — Ambulatory Visit (INDEPENDENT_AMBULATORY_CARE_PROVIDER_SITE_OTHER): Payer: Medicare Other | Admitting: Pulmonary Disease

## 2020-11-17 VITALS — BP 144/78 | HR 60 | Temp 98.4°F | Ht 62.0 in | Wt 210.0 lb

## 2020-11-17 DIAGNOSIS — Z8616 Personal history of COVID-19: Secondary | ICD-10-CM

## 2020-11-17 DIAGNOSIS — Z79899 Other long term (current) drug therapy: Secondary | ICD-10-CM

## 2020-11-17 DIAGNOSIS — F17210 Nicotine dependence, cigarettes, uncomplicated: Secondary | ICD-10-CM

## 2020-11-17 DIAGNOSIS — E669 Obesity, unspecified: Secondary | ICD-10-CM | POA: Diagnosis not present

## 2020-11-17 DIAGNOSIS — J449 Chronic obstructive pulmonary disease, unspecified: Secondary | ICD-10-CM | POA: Diagnosis not present

## 2020-11-17 DIAGNOSIS — R0981 Nasal congestion: Secondary | ICD-10-CM | POA: Diagnosis not present

## 2020-11-17 MED ORDER — STIOLTO RESPIMAT 2.5-2.5 MCG/ACT IN AERS: 2.0000 | INHALATION_SPRAY | Freq: Every day | RESPIRATORY_TRACT | 11 refills | Status: DC

## 2020-11-17 NOTE — Progress Notes (Signed)
Subjective:    Patient ID: Alveria ApleySharon C Siegenthaler, female    DOB: [birth year], 65 y.o.   MRN: [id number]  HPI  The patient is a 65 year old current smoker (half PPD, 100-pack-year history) who presents for evaluation of COPD with ongoing tobacco use, she is kindly referred by Noel ChristmasJulonda Williams, NP.  The patient currently resides at the [facility] in [place] and is followed by [facility].  She is a very difficult historian, during the visit she spent most of the time playing a video game on her phone.  She is easily distracted and I could not get her to focus on the visit.  The question to address per the consult sheet was "COPD, noncompliance with inhaler use and increased use of albuterol inhaler".  It appears that the patient also has had increased symptoms after contracting COVID-19 several months back.  However when I asked her in what way her breathing has worsened she really cannot specify why.  She has recently been switched to Spiriva but seems to want to use albuterol exclusively.  She is using albuterol excessively.  She is currently smoking half a pack of cigarettes per day but was up to 2 packs a day previously.  She is not motivated to quit.  She has issues with chronic nasal congestion and states that nasal inhalers do not help her.  She has significant polypharmacy.  She does not endorse any fevers, chills or sweats.  She has not had any chest pain.  No tachypalpitations.  Her main complaints today are shortness of breath and chronic nasal congestion.  She states that she previously was evaluated by a physician in [place] "years ago" for pulmonary issues.  She underwent PFTs and states she will not have those again.   Review of Systems A 10 point review of systems was performed and it is as noted above otherwise negative.  Past Medical History:  Diagnosis Date  . Anemia   . Anxiety   . Arthritis   . COPD (chronic obstructive pulmonary disease) (HCC)   . Depression   . Diabetes  mellitus without complication (HCC)   . Dyspnea   . Fibromyalgia   . GERD (gastroesophageal reflux disease)   . Headache   . Heart failure (HCC)   . Heart murmur   . Hepatitis   . History of Clostridium difficile infection   . History of methicillin resistant staphylococcus aureus (MRSA)   . Insomnia   . Overactive bladder   . Pneumonia    Past Surgical History:  Procedure Laterality Date  . APPENDECTOMY    . BACK SURGERY     twice  . DIAGNOSTIC LAPAROSCOPY     checking for endometriosis  . ESOPHAGOGASTRODUODENOSCOPY (EGD) WITH PROPOFOL N/A 11/21/2017   Dr. Jena Gaussourk: Erosive reflux esophagitis, low-grade narrowing Schatzki ring status post dilation, small hiatal hernia.  Marland Kitchen. ESOPHAGOGASTRODUODENOSCOPY (EGD) WITH PROPOFOL N/A 07/25/2020   Procedure: ESOPHAGOGASTRODUODENOSCOPY (EGD) WITH PROPOFOL;  Surgeon: Corbin Adeourk, Robert M, MD;  [facility];  Service: Endoscopy;  Laterality: N/A;  12:30pm. patient will arrive 3 hrs prior for rapid test since in nursing facility  . FEMUR FRACTURE SURGERY Right    Rods placed  . HEMORRHOID SURGERY    . INNER EAR SURGERY Left   . MALONEY DILATION N/A 11/21/2017   Procedure: Elease HashimotoMALONEY DILATION;  Surgeon: Corbin Adeourk, Robert M, MD;  [facility];  Service: Endoscopy;  Laterality: N/A;  . MALONEY DILATION N/A 07/25/2020   Procedure: Elease HashimotoMALONEY DILATION;  Surgeon:  Rourk, Gerrit Friends, MD;  [facility];  Service: Endoscopy;  Laterality: N/A;  . NASAL SINUS SURGERY    . right hip surgery     [place]: "took my hip out due to MRSA" occurred after hip replacements   Patient Active Problem List   Diagnosis Date Noted  . Pelvic pain 09/21/2020  . Routine Papanicolaou smear 09/21/2020  . N&V (nausea and vomiting) 11/25/2019  . Loss of weight 11/25/2019  . Chronic hepatitis C (HCC) 06/16/2019  . Generalized abdominal pain 06/16/2019  . Hypertension 03/17/2018  . Abdominal pain, epigastric 10/28/2017  . Nausea without vomiting 10/28/2017  . GERD  (gastroesophageal reflux disease) 10/28/2017  . Colon cancer screening 10/28/2017   Family History  Problem Relation Age of Onset  . Cancer Father        unknown  . Cancer Mother   . Colon cancer Neg Hx     Social History   Tobacco Use  . Smoking status: Current Every Day Smoker    Packs/day: 2.00    Years: 50.00    Pack years: 100.00    Types: Cigarettes  . Smokeless tobacco: Never Used  . Tobacco comment: 0.5PPD- 11/17/2020  Substance Use Topics  . Alcohol use: No   No Known Allergies  Current Meds  Medication Sig  . acetaminophen (TYLENOL) 500 MG tablet Take 1,000 mg by mouth every 8 (eight) hours as needed.  Marland Kitchen albuterol (VENTOLIN HFA) 108 (90 Base) MCG/ACT inhaler Inhale 2 puffs into the lungs every 4 (four) hours as needed for wheezing or shortness of breath.  Marland Kitchen aspirin EC 81 MG tablet Take 81 mg by mouth daily. Swallow whole.  Marland Kitchen atorvastatin (LIPITOR) 40 MG tablet Take 40 mg by mouth daily.  Marland Kitchen azelastine (OPTIVAR) 0.05 % ophthalmic solution 1 drop 2 (two) times daily.  . Cholecalciferol (VITAMIN D) 50 MCG (2000 UT) tablet Take 2,000 Units by mouth daily.  . Cranberry 125 MG TABS Take 125 mg by mouth every 12 (twelve) hours.  . diclofenac Sodium (VOLTAREN) 1 % GEL Apply 2 g topically as needed.  . diphenhydrAMINE (BENADRYL) 25 MG tablet Take 25 mg by mouth every 6 (six) hours as needed for itching.   . DULoxetine (CYMBALTA) 30 MG capsule Take 30 mg by mouth daily.  . DULoxetine (CYMBALTA) 60 MG capsule Take 60 mg by mouth daily.  . furosemide (LASIX) 40 MG tablet Take 40 mg by mouth daily.  . Glucagon HCl 1 MG SOLR Inject 1 mg as directed daily as needed (low blood sugar).   Marland Kitchen guaiFENesin (MUCINEX) 600 MG 12 hr tablet Take 600 mg by mouth daily.  Marland Kitchen HYDROmorphone (DILAUDID) 2 MG tablet Take 2 mg by mouth every 12 (twelve) hours as needed (breakthrough pain).   . hydrOXYzine (ATARAX/VISTARIL) 25 MG tablet Take 25 mg by mouth 4 (four) times daily.  . insulin glargine  (LANTUS) 100 UNIT/ML injection Inject 32 Units into the skin every evening.  . insulin lispro (HUMALOG) 100 UNIT/ML KwikPen Inject 0-10 Units into the skin See admin instructions. Inject as per sliding scale, blood sugar 60-200=0 units, 201-250= 2 units, 251-300=4 units, 301-350=6 units, 351-400=8 units, 401-450=10 units. If greater than 450 or less that 60 call onsite.  Marland Kitchen LORazepam (ATIVAN) 1 MG tablet Take 1 mg by mouth 3 (three) times daily.   . Melatonin 5 MG TABS Take 10 mg by mouth at bedtime.   . metoCLOPramide (REGLAN) 5 MG tablet Take 1 5 mg tab before meals and bedtime (  limit to 3 times daily)  . morphine (MS CONTIN) 30 MG 12 hr tablet Take 30 mg by mouth in the morning and at bedtime.   . naloxone (NARCAN) nasal spray 4 mg/0.1 mL Place 1 spray into the nose as needed.  Marland Kitchen oxybutynin (DITROPAN-XL) 10 MG 24 hr tablet Take 10 mg by mouth daily.   . pantoprazole (PROTONIX) 40 MG tablet Take 1 tablet (40 mg total) by mouth 2 (two) times daily before a meal. (Patient taking differently: Take 40 mg by mouth 2 (two) times daily.)  . Polyethyl Glycol-Propyl Glycol (SYSTANE) 0.4-0.3 % SOLN Place 1 drop into both eyes every 12 (twelve) hours as needed (dry eyes).  . polyethylene glycol (MIRALAX / GLYCOLAX) packet Take 17 g by mouth every 12 (twelve) hours as needed for moderate constipation.   . potassium chloride (KLOR-CON) 10 MEQ tablet Take 10 mEq by mouth daily.  . promethazine (PHENERGAN) 12.5 MG tablet Take 1 tablet (12.5 mg total) by mouth every 8 (eight) hours as needed for nausea or vomiting. No more than twice dialy. (Patient taking differently: Take 12.5 mg by mouth every 6 (six) hours as needed for nausea or vomiting.)  . Saline 0.2 % SOLN Place 1 spray into the nose every 4 (four) hours as needed (nasal dryness/irritation).   Marland Kitchen senna (SENOKOT) 8.6 MG TABS tablet Take 1 tablet by mouth every 12 (twelve) hours.  . sitaGLIPtin (JANUVIA) 100 MG tablet Take 100 mg by mouth daily.  . Tiotropium  Bromide Monohydrate (SPIRIVA RESPIMAT) 2.5 MCG/ACT AERS Inhale 2 puffs into the lungs daily.  . trazodone (DESYREL) 300 MG tablet Take 300 mg by mouth at bedtime.   Marland Kitchen witch hazel-glycerin (TUCKS) pad Apply 1 application topically as needed for itching or irritation.   Immunizations per [facility].  Reviewed.     Objective:   Physical Exam BP (!) 144/78 (BP Location: Left Arm, Cuff Size: Normal)   Pulse 60   Temp 98.4 F (36.9 C) (Temporal)   Ht 5\' 2"  (1.575 m)   Wt 210 lb (95.3 kg) Comment: weight is per pt  SpO2 95%   BMI 38.41 kg/m  GENERAL: Obese woman somewhat disheveled.  Easily distracted, constantly looking at phone and playing phone games.  No conversational dyspnea.  No respiratory distress.  Presents in transport chair. HEAD: Normocephalic, atraumatic.  EYES: Pupils equal, round, reactive to light.  No scleral icterus.  MOUTH: Edentulous, oral mucosa moist. NECK: Supple. No thyromegaly. Trachea midline. No JVD.  No adenopathy. PULMONARY: Good air entry bilaterally.  Scattered wheezes throughout.  Scattered rhonchi. CARDIOVASCULAR: S1 and S2. Regular rate and rhythm.  No rubs, murmurs or gallops heard. ABDOMEN: Obese, otherwise benign. MUSCULOSKELETAL: No joint deformity, no clubbing, no edema.  NEUROLOGIC: No overt focal deficit.  Speech is fluent. SKIN: Intact,warm,dry.  Fingers of right hand stained with nicotine. PSYCH: Distractible.  Terse responses.  Cantankerous at times.       Assessment & Plan:     ICD-10-CM   1. COPD suggested by initial evaluation (HCC) -poorly compensated  J44.9    The patient declines PFTs Recommend switching Spiriva to Stiolto 2 puffs daily STOP SMOKING  2. Chronic nasal congestion  R09.81    May consider use of azelastine Defer to primary team  3. Personal history of COVID-19  Z86.16    She has sensation of increased dyspnea Dyspnea post COVID eventually resolves Needs to stay active QUIT SMOKING  4. Obesity, Class II,  BMI 35-39.9  E66.9  This issue adds complexity to her management She would greatly benefit from weight loss  5. Polypharmacy  Z79.899    This issue adds complexity to her management  6. Tobacco dependence due to cigarettes  F17.210    Patient was counseled regards to discontinuation of smoking Does not appear committed to quit    Meds ordered this encounter  Medications  . Tiotropium Bromide-Olodaterol (STIOLTO RESPIMAT) 2.5-2.5 MCG/ACT AERS    Sig: Inhale 2 puffs into the lungs daily.    Dispense:  4 g    Refill:  11    Discussion:  My clinical impression the patient has COPD likely on the basis of chronic bronchitis/emphysema.  Currently she is not enthusiastic about PFTs and given her distractibility and inability to focus I am not sure she can perform PFTs satisfactorily.  No distress though she was noted to have some bronchospasm today.  Recommend switching Spiriva to Stiolto as noted above.  2 puffs daily.  Consider use of azelastine for her chronic nasal congestion.  The most important interventions for her would be weight loss and to stop smoking.  She also needs to stay active as this is the best intervention post COVID for management of dyspnea.  There is little that we can do to make her comply with the use of the inhalers but hopefully the change in inhaler may help her use her albuterol less.  Her in follow-up in 3 months time please call sooner should any new difficulties arise.   Gailen Shelter, MD [facility]   *This note was dictated using voice recognition software/Dragon.  Despite best efforts to proofread, errors can occur which can change the meaning.  Any change was purely unintentional.

## 2020-11-17 NOTE — Patient Instructions (Addendum)
I am recommending that they change your inhaler from Spiriva to Stiolto.  That will be 2 puffs once a day.   YOU NEED TO STOP SMOKING.  YOU NEED TO USE YOUR INHALERS AS PRESCRIBED.   We will see you in follow-up in 3 months time call sooner should any new problems arise.

## 2020-11-22 ENCOUNTER — Encounter: Payer: Self-pay | Admitting: Pulmonary Disease

## 2020-12-12 ENCOUNTER — Encounter: Payer: Self-pay | Admitting: Gastroenterology

## 2020-12-12 ENCOUNTER — Other Ambulatory Visit: Payer: Self-pay

## 2020-12-12 ENCOUNTER — Ambulatory Visit (INDEPENDENT_AMBULATORY_CARE_PROVIDER_SITE_OTHER): Payer: Medicare Other | Admitting: Gastroenterology

## 2020-12-12 VITALS — BP 129/69 | HR 74 | Temp 97.5°F | Ht 62.0 in | Wt 208.0 lb

## 2020-12-12 DIAGNOSIS — K3184 Gastroparesis: Secondary | ICD-10-CM

## 2020-12-12 DIAGNOSIS — K219 Gastro-esophageal reflux disease without esophagitis: Secondary | ICD-10-CM | POA: Diagnosis not present

## 2020-12-12 DIAGNOSIS — B182 Chronic viral hepatitis C: Secondary | ICD-10-CM | POA: Diagnosis not present

## 2020-12-12 DIAGNOSIS — R110 Nausea: Secondary | ICD-10-CM | POA: Diagnosis not present

## 2020-12-12 MED ORDER — METOCLOPRAMIDE HCL 5 MG PO TABS: ORAL_TABLET | ORAL | 1 refills | Status: DC

## 2020-12-12 NOTE — Progress Notes (Signed)
Primary Care Physician: Audree Bane, DO  Primary Gastroenterologist:  Roetta Sessions, MD   Chief Complaint  Patient presents with  . Nausea    Every morning and if she has something on her stomach she will vomit  . Abdominal Pain    RUQ, comes/goes but constant recently    HPI: Gloria Chapman is a 65 y.o. female here for follow-up.  Patient seen back in February 2022 for follow-up dysphagia, Candida esophagitis, chronic nausea.  History of abnormal gastric emptying study, gastroparesis likely secondary to combination of opioid therapy and anticholinergics.  Patient also with history of GERD, hepatitis C status post treatment, constipation.  Previously refused colonoscopies.  Cologuard testing was not successful getting down through the nursing home, previous specimen determined indeterminant and facility provider told him not to be repeated.  Hepatitis C with difficult treatment through nursing facility with likely gap between bottles 2 and 3.  HCVRNA was neg April 2021.Marland Kitchen  After last visit just to confirm SVR of her records not available, have been requested.  Last office visit low-dose Reglan 5 mg before breakfast and at bedtime (limiting to 3 times daily) added to regimen as it was felt like patient's symptoms were never improved as long she required chronic opioid/anticholinergic medications.  Protonix 40 mg twice daily continued.  According to [facility], patient is getting Reglan in the morning and at bedtime.  EGD November 2021 with oral candidiasis, possible early Candida esophagitis.  Esophagus dilated for history of dysphagia.  CT abdomen pelvis with contrast December 2021 at outside facility with no acute findings.  Gastric emptying study October 2020 showing abnormal delayed gastric emptying.  Today: Patient states her worst nausea is in the morning as soon as she wakes up and again before her evening meal.  Denies vomiting at this point.  Seems to be tolerating Reglan so far.   Patient denies any side effects.  She typically cannot eat anything at breakfast time.  She may have small amount of food at lunch.  Typically eats the best at dinner.  She has had some heartburn but not as bad.  Bowel movements are regular.  Denies melena or rectal bleeding.  Currently on Bactrim for a "boil on her cheek".     Current Outpatient Medications  Medication Sig Dispense Refill  . acetaminophen (TYLENOL) 500 MG tablet Take 1,000 mg by mouth every 8 (eight) hours as needed.    Marland Kitchen albuterol (VENTOLIN HFA) 108 (90 Base) MCG/ACT inhaler Inhale 2 puffs into the lungs every 4 (four) hours as needed for wheezing or shortness of breath.    Marland Kitchen aspirin EC 81 MG tablet Take 81 mg by mouth daily. Swallow whole.    Marland Kitchen azelastine (OPTIVAR) 0.05 % ophthalmic solution 1 drop 2 (two) times daily.    . benzonatate (TESSALON) 100 MG capsule Take 100 mg by mouth every 6 (six) hours as needed for cough.    Marland Kitchen BREO ELLIPTA 100-25 MCG/INH AEPB Inhale 1 puff into the lungs daily.    . Cholecalciferol (VITAMIN D) 50 MCG (2000 UT) tablet Take 2,000 Units by mouth daily.    . Cranberry 125 MG TABS Take 125 mg by mouth every 12 (twelve) hours.    Marland Kitchen dextromethorphan-guaiFENesin (ROBITUSSIN-DM) 10-100 MG/5ML liquid Take 20 mLs by mouth every 4 (four) hours as needed for cough.    . diclofenac Sodium (VOLTAREN) 1 % GEL Apply 2 g topically as needed.    . diphenhydrAMINE (BENADRYL) 25 MG tablet  Take 25 mg by mouth every 6 (six) hours as needed for itching.     . DULoxetine (CYMBALTA) 30 MG capsule Take 30 mg by mouth daily.    . DULoxetine (CYMBALTA) 60 MG capsule Take 60 mg by mouth daily.    . furosemide (LASIX) 40 MG tablet Take 40 mg by mouth daily.    Marland Kitchen gabapentin (NEURONTIN) 300 MG capsule Take 1 capsule by mouth 2 (two) times daily.    . Glucagon HCl 1 MG SOLR Inject 1 mg as directed daily as needed (low blood sugar).     Marland Kitchen HYDROmorphone (DILAUDID) 2 MG tablet Take 2 mg by mouth every 12 (twelve) hours as  needed (breakthrough pain).     . hydrOXYzine (ATARAX/VISTARIL) 25 MG tablet Take 25 mg by mouth 4 (four) times daily.    . insulin glargine (LANTUS) 100 UNIT/ML injection Inject 35 Units into the skin every evening.    . insulin lispro (HUMALOG) 100 UNIT/ML KwikPen Inject 0-10 Units into the skin See admin instructions. Inject as per sliding scale, blood sugar 60-200=0 units, 201-250= 2 units, 251-300=4 units, 301-350=6 units, 351-400=8 units, 401-450=10 units. If greater than 450 or less that 60 call onsite.    Marland Kitchen LORazepam (ATIVAN) 1 MG tablet Take 1 mg by mouth 3 (three) times daily.     . Melatonin 5 MG TABS Take 10 mg by mouth at bedtime.     . metoCLOPramide (REGLAN) 5 MG tablet Take 1 5 mg tab before meals and bedtime (limit to 3 times daily) (Patient taking differently: in the morning and at bedtime. Take 1 5 mg tab before meals and bedtime (limit to 3 times daily)) 90 tablet 1  . morphine (MS CONTIN) 30 MG 12 hr tablet Take 30 mg by mouth 3 (three) times daily.    . naloxone (NARCAN) nasal spray 4 mg/0.1 mL Place 1 spray into the nose as needed.    Marland Kitchen oxybutynin (DITROPAN-XL) 10 MG 24 hr tablet Take 10 mg by mouth daily.     . pantoprazole (PROTONIX) 40 MG tablet Take 1 tablet (40 mg total) by mouth 2 (two) times daily before a meal. (Patient taking differently: Take 40 mg by mouth 2 (two) times daily.) 60 tablet 5  . Polyethyl Glycol-Propyl Glycol (SYSTANE) 0.4-0.3 % SOLN Place 1 drop into both eyes every 12 (twelve) hours as needed (dry eyes).    . polyethylene glycol (MIRALAX / GLYCOLAX) packet Take 17 g by mouth every 12 (twelve) hours as needed for moderate constipation.     . potassium chloride (KLOR-CON) 10 MEQ tablet Take 10 mEq by mouth daily.    . promethazine (PHENERGAN) 12.5 MG tablet Take 1 tablet (12.5 mg total) by mouth every 8 (eight) hours as needed for nausea or vomiting. No more than twice dialy. (Patient taking differently: Take 12.5 mg by mouth every 6 (six) hours as needed  for nausea or vomiting.) 60 tablet 0  . Saline 0.2 % SOLN Place 1 spray into the nose every 4 (four) hours as needed (nasal dryness/irritation).     Marland Kitchen senna (SENOKOT) 8.6 MG TABS tablet Take 1 tablet by mouth every 12 (twelve) hours.    . sitaGLIPtin (JANUVIA) 100 MG tablet Take 100 mg by mouth daily.    Marland Kitchen sulfamethoxazole-trimethoprim (BACTRIM DS) 800-160 MG tablet Take 1 tablet by mouth 2 (two) times daily.    . Tiotropium Bromide-Olodaterol (STIOLTO RESPIMAT) 2.5-2.5 MCG/ACT AERS Inhale 2 puffs into the lungs daily. 4 g 11  .  trazodone (DESYREL) 300 MG tablet Take 300 mg by mouth at bedtime.     Marland Kitchen witch hazel-glycerin (TUCKS) pad Apply 1 application topically as needed for itching or irritation.     No current facility-administered medications for this visit.    Allergies as of 12/12/2020  . (No Known Allergies)    ROS:  General: Negative for anorexia, weight loss, fever, chills, fatigue, weakness. ENT: Negative for hoarseness, difficulty swallowing , nasal congestion. CV: Negative for chest pain, angina, palpitations, dyspnea on exertion, peripheral edema.  Respiratory: Negative for dyspnea at rest, dyspnea on exertion, cough, sputum, wheezing.  GI: See history of present illness. GU:  Negative for dysuria, hematuria, urinary incontinence, urinary frequency, nocturnal urination.  Endo: Negative for unusual weight change.    Physical Examination:   BP 129/69   Pulse 74   Temp (!) 97.5 F (36.4 C)   Ht 5\' 2"  (1.575 m)   Wt 208 lb (94.3 kg)   BMI 38.04 kg/m   General: Well-nourished, well-developed in no acute distress. Patient was very demanding to staff and uncooperative at times.  Eyes: No icterus. Mouth: masked Abdomen: Bowel sounds are normal, nontender, nondistended, no hepatosplenomegaly or masses, no abdominal bruits or hernia , no rebound or guarding.  Difficult to examine in the wheelchair. Extremities: 1+bilateral lower extremity edema. No clubbing or  deformities. Neuro: Alert and oriented x 4   Skin: Warm and dry, no jaundice.   Psych: Alert and cooperative, normal mood and affect.  Labs:  Lab Results  Component Value Date   CREATININE 0.73 07/21/2020   BUN 8 07/21/2020   NA 133 (L) 07/21/2020   K 4.3 07/21/2020   CL 97 (L) 07/21/2020   CO2 25 07/21/2020   Lab Results  Component Value Date   ALT 12 07/21/2020   AST 15 07/21/2020   ALKPHOS 64 07/21/2020   BILITOT 0.4 07/21/2020   No results found for: TSH Lab Results  Component Value Date   WBC 10.3 07/21/2020   HGB 15.3 (H) 07/21/2020   HCT 46.4 (H) 07/21/2020   MCV 88.5 07/21/2020   PLT 229 07/21/2020     Imaging Studies: No results found.  Assessment/plan:  65 year old female with chronic nausea, chronic GERD, constipation in the setting of known gastroparesis secondary to longstanding diabetes and chronic opioid/anticholinergic medication use.  Patient notes some improvement on low-dose Reglan.  She denies any side effects.  She remembers potential side effects discussed by Dr. 64 and states she has not noticed any.  Her worst times for nausea in the morning and before evening meal.  We discussed adjusting dose somewhat to see if we can get this better controlled.  She request higher dose of Phenergan but I do not feel comfortable with that in the setting of polypharmacy and given her age.  Chronic constipation seems to be adequately controlled at this time.  History of hepatitis C status post treatment, fragmented therapy however.  HCVRNA was requested after February office visit, results not available to me at this time.  Will request from nursing home.  1. Change Reglan to 10 mg before breakfast, 5 mg before evening meal.  If patient develops uncontrollable movement of the mouth, tongue, face or other dystonia type symptoms, medication should be stopped immediately.  Written instructions provided to nursing home. 2. Requested copy of last HCVRNA results to  be sent to our office. 3. Return to the office in 4 months for follow-up.

## 2020-12-12 NOTE — Patient Instructions (Addendum)
1. Change Reglan (metoclopramide) to 10mg  before breakfast and 5mg  before evening meal. IF PATIENT DEVELOPS SIDE EFFECTS SUCH AS UNCONTROLLABLE MOVEMENT OF THE MOUTH, TONGUE, FACE, PLEASE STOP MEDICATION IMMEDIATELY. New RX sent to [facility].  2. Please fax copy of last Hep C RNA to at [phone].  3. Return to the office in four months.

## 2020-12-13 ENCOUNTER — Encounter: Payer: Self-pay | Admitting: Gastroenterology

## 2021-01-02 ENCOUNTER — Telehealth: Payer: Self-pay | Admitting: Gastroenterology

## 2021-01-02 NOTE — Telephone Encounter (Signed)
Please request last HCV RNA from [facility] in [place].

## 2021-01-05 ENCOUNTER — Telehealth: Payer: Self-pay | Admitting: Internal Medicine

## 2021-01-05 NOTE — Telephone Encounter (Signed)
TIFFANY WOOD UNIT MANAGER FROM THE [facility] IN [place] NEEDS TO SPEAK TO A NURSE ABOUT THE PLAN OF CARE AND RECOMMENDATIONS FOR THE PATIENT

## 2021-01-05 NOTE — Telephone Encounter (Signed)
Sent a request to [facility]

## 2021-01-09 NOTE — Telephone Encounter (Signed)
Received labs dated 06/18/2020: HCVRNA less than 15, not detected. No further work up needed given SVR.

## 2021-02-03 ENCOUNTER — Ambulatory Visit: Payer: Medicare Other | Admitting: Adult Health

## 2021-02-20 ENCOUNTER — Encounter: Payer: Self-pay | Admitting: Adult Health

## 2021-02-20 ENCOUNTER — Ambulatory Visit (INDEPENDENT_AMBULATORY_CARE_PROVIDER_SITE_OTHER): Payer: Medicare Other | Admitting: Adult Health

## 2021-02-20 ENCOUNTER — Other Ambulatory Visit: Payer: Self-pay

## 2021-02-20 VITALS — BP 112/71 | HR 82

## 2021-02-20 DIAGNOSIS — R102 Pelvic and perineal pain: Secondary | ICD-10-CM | POA: Diagnosis not present

## 2021-02-20 NOTE — Progress Notes (Signed)
  Subjective:     Patient ID: Gloria Chapman, female   DOB: 09-13-1956, 65 y.o.   MRN: [id number]  HPI Lace is a 65 year old white female,divorced, PM resides at [facility] in [place] in complaining of pelvic pain, it is chronic.  She had [place] 09/27/20 uterus normal, could not visualize EEC or ovaries, she had CT 08/27/20 showed possible mild endometrial thickness. She has had no vaginal bleeding. PCP is Dr Brooke Dare. Lab Results  Component Value Date   DIAGPAP  09/21/2020    - Negative for intraepithelial lesion or malignancy (NILM)   HPVHIGH Negative 09/21/2020    Review of Systems +chronic pelvic pain Denies any vaginal bleeding She is not sexually active. Reviewed past medical,surgical, social and family history. Reviewed medications and allergies.     Objective:   Physical Exam BP 112/71 (BP Location: Left Arm, Patient Position: Sitting, Cuff Size: Normal)   Pulse 82 Skin warm and dry.  Lungs: clear to ausculation bilaterally. Cardiovascular: regular rate and rhythm. She is in Wheelchair and says she hurts today, pelvic declined.     [facility] - 02/20/21 1531      Pregnancy Intention Screening   Does the patient want to become pregnant in the next year? No    Does the patient's partner want to become pregnant in the next year? No    Would the patient like to discuss contraceptive options today? No      Contraception Wrap Up   Current Method No Method - Other Reason   PM   End Method No Method - Other Reason   PM   Contraception Counseling Provided No          Assessment:     1. Pelvic pain,chronic    Plan:     Follow up with Dr Despina Hidden to discuss options, she wants hysterectomy she says

## 2021-02-27 ENCOUNTER — Encounter: Payer: Self-pay | Admitting: Gastroenterology

## 2021-03-02 ENCOUNTER — Ambulatory Visit: Payer: Medicare Other | Admitting: Obstetrics & Gynecology

## 2021-03-17 ENCOUNTER — Encounter: Payer: Self-pay | Admitting: Obstetrics & Gynecology

## 2021-03-17 ENCOUNTER — Other Ambulatory Visit: Payer: Self-pay

## 2021-03-17 ENCOUNTER — Ambulatory Visit (INDEPENDENT_AMBULATORY_CARE_PROVIDER_SITE_OTHER): Payer: Medicare Other | Admitting: Obstetrics & Gynecology

## 2021-03-17 VITALS — BP 118/77 | HR 76

## 2021-03-17 DIAGNOSIS — R109 Unspecified abdominal pain: Secondary | ICD-10-CM | POA: Diagnosis not present

## 2021-03-17 MED ORDER — GABAPENTIN 300 MG PO CAPS: 600.0000 mg | ORAL_CAPSULE | Freq: Three times a day (TID) | ORAL | 1 refills | Status: DC

## 2021-03-17 NOTE — Progress Notes (Signed)
Follow up appointment for results  Chief Complaint  Patient presents with   Pelvic Pain    Wants a hyst    Blood pressure 118/77, pulse 76.  Pt reports 1 year history of lower pelvic pain She denies urinary symptoms or bowel issues, no constipation No vaginal bleeding No fever chills diarrhea   CLINICAL DATA:  Pelvic pain, postmenopausal   EXAM: LIMITED ULTRASOUND OF PELVIS   TECHNIQUE: Limited transabdominal ultrasound examination of the pelvis was performed. Patient refused transvaginal imaging.   COMPARISON:  None   FINDINGS: Uterus:   Measurements: 4.8 x 2.9 x 3.7 cm = 23 mL. Poorly visualized due to body habitus and poor acoustic window. No gross mass.   Endometrium:   Thickness: Inadequately visualized   Ovaries/adnexa:   Neither ovary is visualized, likely obscured by bowel loops.   Other: No free pelvic fluid or adnexal masses were seen.   IMPRESSION: Limited exam, demonstrating grossly normal uterine size but otherwise inadequate visualization of endometrial complex and ovaries.     Electronically Signed   By: Ulyses Southward M.D.   On: 09/27/2020 16:27       Exam Tender bilateral lower abdominal wall, appears to be abdominal wall pain  MEDS ordered this encounter: Meds ordered this encounter  Medications   gabapentin (NEURONTIN) 300 MG capsule    Sig: Take 2 capsules (600 mg total) by mouth 3 (three) times daily.    Dispense:  540 capsule    Refill:  1    Orders for this encounter: No orders of the defined types were placed in this encounter.   Impression:   ICD-10-CM   1. Abdominal wall pain  R10.9        Plan: Increase neurontin 600 TID follow up in 3 months  Follow Up: Return in about 3 months (around 06/17/2021) for Follow up, with Dr Despina Hidden.     All questions were answered.  Past Medical History:  Diagnosis Date   Anemia    Anxiety    Arthritis    COPD (chronic obstructive pulmonary disease) (HCC)    Depression     Diabetes mellitus without complication (HCC)    Dyspnea    Fibromyalgia    GERD (gastroesophageal reflux disease)    Headache    Heart failure (HCC)    Heart murmur    Hepatitis    History of Clostridium difficile infection    History of methicillin resistant staphylococcus aureus (MRSA)    Insomnia    Overactive bladder    Pneumonia     Past Surgical History:  Procedure Laterality Date   APPENDECTOMY     BACK SURGERY     twice   DIAGNOSTIC LAPAROSCOPY     checking for endometriosis   ESOPHAGOGASTRODUODENOSCOPY (EGD) WITH PROPOFOL N/A 11/21/2017   Dr. Jena Gauss: Erosive reflux esophagitis, low-grade narrowing Schatzki ring status post dilation, small hiatal hernia.   ESOPHAGOGASTRODUODENOSCOPY (EGD) WITH PROPOFOL N/A 07/25/2020   Dr. Jena Gauss: Oral candidiasis, likely early Candida esophagitis, normal stomach and duodenum.  Esophagus dilated due to history of dysphagia.   FEMUR FRACTURE SURGERY Right    Rods placed   HEMORRHOID SURGERY     INNER EAR SURGERY Left    MALONEY DILATION N/A 11/21/2017   Procedure: Elease Hashimoto DILATION;  Surgeon: Corbin Ade, MD;  [facility]: [facility];  Service: Endoscopy;  Laterality: N/A;   MALONEY DILATION N/A 07/25/2020   Procedure: Elease Hashimoto DILATION;  Surgeon: Corbin Ade, MD;  Location: [facility];  Service: Endoscopy;  Laterality: N/A;   NASAL SINUS SURGERY     right hip surgery     [place]: "took my hip out due to MRSA" occurred after hip replacements    OB History     Gravida  3   Para  1   Term  1   Preterm      AB  2   Living  1      SAB      IAB      Ectopic      Multiple      Live Births  1           No Known Allergies  Social History   Socioeconomic History   Marital status: Divorced    Spouse name: Not on file   Number of children: Not on file   Years of education: Not on file   Highest education level: Not on file  Occupational History   Not on file  Tobacco Use   Smoking status: Every Day     Packs/day: 2.00    Years: 50.00    Pack years: 100.00    Types: Cigarettes   Smokeless tobacco: Never   Tobacco comments:    0.5PPD- 11/17/2020  Vaping Use   Vaping Use: Never used  Substance and Sexual Activity   Alcohol use: No   Drug use: No   Sexual activity: Not Currently    Birth control/protection: Post-menopausal  Other Topics Concern   Not on file  Social History Narrative   Not on file   Social Determinants of Health   Financial Resource Strain: Low Risk    Difficulty of Paying Living Expenses: Not hard at all  Food Insecurity: No Food Insecurity   Worried About Programme researcher, broadcasting/film/video in the Last Year: Never true   Ran Out of Food in the Last Year: Never true  Transportation Needs: No Transportation Needs   Lack of Transportation (Medical): No   Lack of Transportation (Non-Medical): No  Physical Activity: Inactive   Days of Exercise per Week: 0 days   Minutes of Exercise per Session: 0 min  Stress: No Stress Concern Present   Feeling of Stress : Only a little  Social Connections: Socially Isolated   Frequency of Communication with Friends and Family: Twice a week   Frequency of Social Gatherings with Friends and Family: Never   Attends Religious Services: Never   Database administrator or Organizations: No   Attends Engineer, structural: Never   Marital Status: Divorced    Family History  Problem Relation Age of Onset   Cancer Father        unknown   Cancer Mother    Colon cancer Neg Hx

## 2021-03-24 ENCOUNTER — Ambulatory Visit: Payer: Medicare Other | Admitting: Gastroenterology

## 2021-03-31 ENCOUNTER — Ambulatory Visit: Payer: Medicare Other | Admitting: Gastroenterology

## 2021-06-16 ENCOUNTER — Ambulatory Visit: Payer: Medicare Other | Admitting: Obstetrics & Gynecology

## 2021-06-21 ENCOUNTER — Other Ambulatory Visit: Payer: Self-pay

## 2021-06-21 ENCOUNTER — Ambulatory Visit (INDEPENDENT_AMBULATORY_CARE_PROVIDER_SITE_OTHER): Payer: Medicare Other | Admitting: Gastroenterology

## 2021-06-21 ENCOUNTER — Encounter: Payer: Self-pay | Admitting: Gastroenterology

## 2021-06-21 ENCOUNTER — Telehealth: Payer: Self-pay | Admitting: *Deleted

## 2021-06-21 VITALS — BP 139/81 | HR 83 | Temp 97.7°F | Ht 62.0 in | Wt 218.0 lb

## 2021-06-21 DIAGNOSIS — K219 Gastro-esophageal reflux disease without esophagitis: Secondary | ICD-10-CM | POA: Diagnosis not present

## 2021-06-21 DIAGNOSIS — K3184 Gastroparesis: Secondary | ICD-10-CM | POA: Diagnosis not present

## 2021-06-21 DIAGNOSIS — B182 Chronic viral hepatitis C: Secondary | ICD-10-CM

## 2021-06-21 DIAGNOSIS — R1013 Epigastric pain: Secondary | ICD-10-CM | POA: Diagnosis not present

## 2021-06-21 MED ORDER — PANTOPRAZOLE SODIUM 40 MG PO TBEC: 40.0000 mg | DELAYED_RELEASE_TABLET | Freq: Two times a day (BID) | ORAL | 3 refills | Status: DC

## 2021-06-21 NOTE — Progress Notes (Signed)
Primary Care Physician: Renata Caprice, DO  Primary Gastroenterologist:  Garfield Cornea, MD   Chief Complaint  Patient presents with   Hiatal Hernia    Bothering her a lot. Facility doctor changed Protonix to nightly    HPI: Gloria Chapman is a 65 y.o. female here for follow up. Last seen in 11/2020. follow-up. She has h/o gastroparesis likely secondary to combination of opioid therapy and anticholinergics.  History of chronic GERD, hepatitis C status post treatment with SVR, constipation.  Previously refused colonoscopies.  Cologuard testing was not successful getting down through the nursing home, previous specimen determined indeterminant and facility provider told him not to be repeated.  EGD November 2021 with oral candidiasis, possible early Candida esophagitis.  Esophagus dilated for history of dysphagia.   CT abdomen pelvis with contrast December 2021 at outside facility with no acute findings.   Gastric emptying study October 2020 showing abnormal delayed gastric emptying.  Today: Patient states she has worsening epigastric pain and nausea that she was seen last.  No longer on Reglan.  She states she developed movement of her tongue and mouth which was uncontrollable.  We have now listed that on her allergies.  She complains of frequent pain in the epigastrium.  Worse with meals.  Tends to tolerate her evening meals better if she is able to get trazodone before she eats.  Denies vomiting.  She does have some regurgitation.  Belching relieves some of her nausea.  Drinks carbonated beverages to assist with belching.  Uses promethazine about twice daily, states she can never get more because nursing staff slow at rounding.  Did not appreciate improvement with Zofran.  Denies weight loss.  Recently started on gabapentin for suspected abdominal wall pain, evaluated and managed by gynecology.  Having some difficulty swallowing pills but not solid foods or liquids.     Current  Outpatient Medications  Medication Sig Dispense Refill   acetaminophen (TYLENOL) 500 MG tablet Take 1,000 mg by mouth every 8 (eight) hours as needed.     albuterol (VENTOLIN HFA) 108 (90 Base) MCG/ACT inhaler Inhale 2 puffs into the lungs every 4 (four) hours as needed for wheezing or shortness of breath.     aspirin EC 81 MG tablet Take 81 mg by mouth daily. Swallow whole.     benzonatate (TESSALON) 100 MG capsule Take 100 mg by mouth every 6 (six) hours as needed for cough.     bisacodyl (DULCOLAX) 5 MG EC tablet Take 15 mg by mouth daily as needed for moderate constipation.     Cholecalciferol (VITAMIN D) 50 MCG (2000 UT) tablet Take 2,000 Units by mouth daily.     Cranberry 125 MG TABS Take 125 mg by mouth every 12 (twelve) hours.     cyclobenzaprine (FLEXERIL) 10 MG tablet Take 1 tablet by mouth every 8 (eight) hours as needed.     diclofenac Sodium (VOLTAREN) 1 % GEL Apply 2 g topically as needed.     diphenhydrAMINE (BENADRYL) 25 MG tablet Take 25 mg by mouth every 6 (six) hours as needed for itching.      DULoxetine (CYMBALTA) 60 MG capsule Take 60 mg by mouth daily.     furosemide (LASIX) 40 MG tablet Take 40 mg by mouth daily.     gabapentin (NEURONTIN) 300 MG capsule Take 2 capsules (600 mg total) by mouth 3 (three) times daily. (Patient taking differently: Take 300 mg by mouth 2 (two) times daily.) 540 capsule 1  Glucagon HCl 1 MG SOLR Inject 1 mg as directed daily as needed (low blood sugar).      insulin glargine (LANTUS) 100 UNIT/ML injection Inject 43 Units into the skin every evening.     LORazepam (ATIVAN) 1 MG tablet Take 1 mg by mouth every 8 (eight) hours as needed.     Melatonin 5 MG TABS Take 10 mg by mouth at bedtime.      naloxone (NARCAN) nasal spray 4 mg/0.1 mL Place 1 spray into the nose as needed.     oxybutynin (DITROPAN-XL) 10 MG 24 hr tablet Take 10 mg by mouth daily.      oxyCODONE-acetaminophen (PERCOCET) 10-325 MG tablet Take 1 tablet by mouth every 8 (eight)  hours as needed for pain.     pantoprazole (PROTONIX) 40 MG tablet Take 1 tablet (40 mg total) by mouth 2 (two) times daily before a meal. (Patient taking differently: Take 40 mg by mouth daily.) 60 tablet 5   Polyethyl Glycol-Propyl Glycol (SYSTANE) 0.4-0.3 % SOLN Place 1 drop into both eyes every 12 (twelve) hours as needed (dry eyes).     polyethylene glycol (MIRALAX / GLYCOLAX) packet Take 17 g by mouth 2 (two) times daily.     potassium chloride (KLOR-CON) 10 MEQ tablet Take 10 mEq by mouth daily.     promethazine (PHENERGAN) 12.5 MG tablet Take 1 tablet (12.5 mg total) by mouth every 8 (eight) hours as needed for nausea or vomiting. No more than twice dialy. (Patient taking differently: Take 12.5 mg by mouth every 6 (six) hours as needed for nausea or vomiting.) 60 tablet 0   Saline 0.2 % SOLN Place 1 spray into the nose every 4 (four) hours as needed (nasal dryness/irritation).      senna (SENOKOT) 8.6 MG TABS tablet Take 1 tablet by mouth every 12 (twelve) hours.     sitaGLIPtin (JANUVIA) 100 MG tablet Take 100 mg by mouth daily.     Tiotropium Bromide-Olodaterol (STIOLTO RESPIMAT) 2.5-2.5 MCG/ACT AERS Inhale 2 puffs into the lungs daily.     traZODone (DESYREL) 100 MG tablet Take 200 mg by mouth at bedtime.     witch hazel-glycerin (TUCKS) pad Apply 1 application topically as needed for itching or irritation.     dextromethorphan-guaiFENesin (ROBITUSSIN-DM) 10-100 MG/5ML liquid Take 20 mLs by mouth every 4 (four) hours as needed for cough.     No current facility-administered medications for this visit.    Allergies as of 06/21/2021   (No Known Allergies)    ROS:  General: Negative for anorexia, weight loss, fever, chills, fatigue, weakness. ENT: Negative for hoarseness, difficulty swallowing , nasal congestion.  See HPI CV: Negative for chest pain, angina, palpitations, dyspnea on exertion, peripheral edema.  Respiratory: Negative for dyspnea at rest, dyspnea on exertion, cough,  sputum, wheezing.  GI: See history of present illness. GU:  Negative for dysuria, hematuria, urinary incontinence, urinary frequency, nocturnal urination.  Endo: Negative for unusual weight change.    Physical Examination:   BP 139/81   Pulse 83   Temp 97.7 F (36.5 C) (Temporal)   Ht _0  (1.575 m)   Wt 218 lb (98.9 kg)   BMI 39.87 kg/m   General: Well-nourished, well-developed in no acute distress.  Eyes: No icterus. Mouth: masked Lungs: Clear to auscultation bilaterally.  Heart: Regular rate and rhythm, no murmurs rubs or gallops.  Abdomen: Bowel sounds are normal, nontender, nondistended, no hepatosplenomegaly or masses, no abdominal bruits, no rebound or guarding.  Small umbilical hernia easily reducible, nontender. Extremities: No lower extremity edema. No clubbing or deformities. Neuro: Alert and oriented x 4   Skin: Warm and dry, no jaundice.   Psych: Alert and cooperative, normal mood and affect.     Assessment:  65 year old female with chronic nausea, GERD, constipation with known gastroparesis in the setting of longstanding diabetes and chronic opioid/anticholinergic medication use.  Gastroparesis: Increased nausea without vomiting.  Patient reports developing tardive dyskinesia with Reglan, medication was stopped.  We have added it to her allergies.  No weight loss.  Nausea may be exacerbated by poorly controlled reflux.  GERD: Dose increased symptoms with change in PPI therapy.  Previously did better on pantoprazole twice daily but with recent facility changes, her medications were altered.  She is receiving pantoprazole 40 mg at bedtime with control of nocturnal symptoms but continues to have frequent daytime epigastric pain and nausea worsened with change in PPI.  Epigastric/right upper quadrant pain: Tends to have chronic abdominal pain.  EGD and CT last year as outlined previously.  Gallbladder remains in situ.  Update labs.  Abdominal ultrasound.  History of  hepatitis C status posttreatment, confirmed eradication.  Prior CT imaging last year with questionable early cirrhosis.  Update present at this time.   Plan: Abdominal ultrasound. C-Met, CBC, lipase Increase pantoprazole to 40 mg 30 minutes before breakfast and 30 minutes before evening meal Return office visit in 6 months.

## 2021-06-21 NOTE — Progress Notes (Deleted)
Primary Care Physician: Audree Bane, DO  Primary Gastroenterologist:  Roetta Sessions, MD   Chief Complaint  Patient presents with   Hiatal Hernia    Bothering her a lot. Facility doctor changed Protonix to nightly    HPI: Gloria Chapman is a 65 y.o. female here    Reglan, stopped due to mouth motions. Having a lot of epigastric pain during the day and nausea. No heartburn. Phenergan about two times per day. No vomiting. Some regurgitations with burping. Some relief with belching, drinks carbonated beverages to help. BM regular. No melena, brbpr.    Trazadone before dinner helps.    Current Outpatient Medications  Medication Sig Dispense Refill   acetaminophen (TYLENOL) 500 MG tablet Take 1,000 mg by mouth every 8 (eight) hours as needed.     albuterol (VENTOLIN HFA) 108 (90 Base) MCG/ACT inhaler Inhale 2 puffs into the lungs every 4 (four) hours as needed for wheezing or shortness of breath.     aspirin EC 81 MG tablet Take 81 mg by mouth daily. Swallow whole.     benzonatate (TESSALON) 100 MG capsule Take 100 mg by mouth every 6 (six) hours as needed for cough.     bisacodyl (DULCOLAX) 5 MG EC tablet Take 15 mg by mouth daily as needed for moderate constipation.     Cholecalciferol (VITAMIN D) 50 MCG (2000 UT) tablet Take 2,000 Units by mouth daily.     Cranberry 125 MG TABS Take 125 mg by mouth every 12 (twelve) hours.     cyclobenzaprine (FLEXERIL) 10 MG tablet Take 1 tablet by mouth every 8 (eight) hours as needed.     diclofenac Sodium (VOLTAREN) 1 % GEL Apply 2 g topically as needed.     diphenhydrAMINE (BENADRYL) 25 MG tablet Take 25 mg by mouth every 6 (six) hours as needed for itching.      DULoxetine (CYMBALTA) 60 MG capsule Take 60 mg by mouth daily.     furosemide (LASIX) 40 MG tablet Take 40 mg by mouth daily.     gabapentin (NEURONTIN) 300 MG capsule Take 2 capsules (600 mg total) by mouth 3 (three) times daily. (Patient taking differently: Take 300 mg by  mouth 2 (two) times daily.) 540 capsule 1   Glucagon HCl 1 MG SOLR Inject 1 mg as directed daily as needed (low blood sugar).      insulin glargine (LANTUS) 100 UNIT/ML injection Inject 43 Units into the skin every evening.     LORazepam (ATIVAN) 1 MG tablet Take 1 mg by mouth every 8 (eight) hours as needed.     Melatonin 5 MG TABS Take 10 mg by mouth at bedtime.      naloxone (NARCAN) nasal spray 4 mg/0.1 mL Place 1 spray into the nose as needed.     oxybutynin (DITROPAN-XL) 10 MG 24 hr tablet Take 10 mg by mouth daily.      oxyCODONE-acetaminophen (PERCOCET) 10-325 MG tablet Take 1 tablet by mouth every 8 (eight) hours as needed for pain.     pantoprazole (PROTONIX) 40 MG tablet Take 1 tablet (40 mg total) by mouth 2 (two) times daily before a meal. (Patient taking differently: Take 40 mg by mouth daily.) 60 tablet 5   Polyethyl Glycol-Propyl Glycol (SYSTANE) 0.4-0.3 % SOLN Place 1 drop into both eyes every 12 (twelve) hours as needed (dry eyes).     polyethylene glycol (MIRALAX / GLYCOLAX) packet Take 17 g by mouth 2 (two) times daily.  potassium chloride (KLOR-CON) 10 MEQ tablet Take 10 mEq by mouth daily.     promethazine (PHENERGAN) 12.5 MG tablet Take 1 tablet (12.5 mg total) by mouth every 8 (eight) hours as needed for nausea or vomiting. No more than twice dialy. (Patient taking differently: Take 12.5 mg by mouth every 6 (six) hours as needed for nausea or vomiting.) 60 tablet 0   Saline 0.2 % SOLN Place 1 spray into the nose every 4 (four) hours as needed (nasal dryness/irritation).      senna (SENOKOT) 8.6 MG TABS tablet Take 1 tablet by mouth every 12 (twelve) hours.     sitaGLIPtin (JANUVIA) 100 MG tablet Take 100 mg by mouth daily.     Tiotropium Bromide-Olodaterol (STIOLTO RESPIMAT) 2.5-2.5 MCG/ACT AERS Inhale 2 puffs into the lungs daily.     traZODone (DESYREL) 100 MG tablet Take 200 mg by mouth at bedtime.     witch hazel-glycerin (TUCKS) pad Apply 1 application topically as  needed for itching or irritation.     dextromethorphan-guaiFENesin (ROBITUSSIN-DM) 10-100 MG/5ML liquid Take 20 mLs by mouth every 4 (four) hours as needed for cough.     No current facility-administered medications for this visit.    Allergies as of 06/21/2021 - Review Complete 06/21/2021  Allergen Reaction Noted   Reglan [metoclopramide]  06/21/2021    ROS:  General: Negative for anorexia, weight loss, fever, chills, fatigue, weakness. ENT: Negative for hoarseness, difficulty swallowing , nasal congestion. CV: Negative for chest pain, angina, palpitations, dyspnea on exertion, peripheral edema.  Respiratory: Negative for dyspnea at rest, dyspnea on exertion, cough, sputum, wheezing.  GI: See history of present illness. GU:  Negative for dysuria, hematuria, urinary incontinence, urinary frequency, nocturnal urination.  Endo: Negative for unusual weight change.    Physical Examination:   BP 139/81   Pulse 83   Temp 97.7 F (36.5 C) (Temporal)   Ht 5\' 2"  (1.575 m)   Wt 218 lb (98.9 kg)   BMI 39.87 kg/m   General: Well-nourished, well-developed in no acute distress.  Eyes: No icterus. Mouth: masked Lungs: Clear to auscultation bilaterally.  Heart: Regular rate and rhythm, no murmurs rubs or gallops.  Abdomen: Bowel sounds are normal, nontender, nondistended, no hepatosplenomegaly or masses, no abdominal bruits or hernia , no rebound or guarding.   Extremities: No lower extremity edema. No clubbing or deformities. Neuro: Alert and oriented x 4   Skin: Warm and dry, no jaundice.   Psych: Alert and cooperative, normal mood and affect.     Assessment:     Plan:

## 2021-06-21 NOTE — Progress Notes (Deleted)
Primary Care Physician: Audree Bane, DO  Primary Gastroenterologist:  Roetta Sessions, MD   Chief Complaint  Patient presents with   Hiatal Hernia    Bothering her a lot. Facility doctor changed Protonix to nightly    HPI: Gloria Chapman is a 65 y.o. female here    Reglan, stopped due to mouth motions. Having a lot of epigastric pain during the day and nausea. No heartburn. Phenergan about two times per day. No vomiting. Some regurgitations with burping. Some relief with belching, drinks carbonated beverages to help. BM regular. No melena, brbpr.    Trazadone before dinner helps.    Current Outpatient Medications  Medication Sig Dispense Refill   acetaminophen (TYLENOL) 500 MG tablet Take 1,000 mg by mouth every 8 (eight) hours as needed.     albuterol (VENTOLIN HFA) 108 (90 Base) MCG/ACT inhaler Inhale 2 puffs into the lungs every 4 (four) hours as needed for wheezing or shortness of breath.     aspirin EC 81 MG tablet Take 81 mg by mouth daily. Swallow whole.     benzonatate (TESSALON) 100 MG capsule Take 100 mg by mouth every 6 (six) hours as needed for cough.     bisacodyl (DULCOLAX) 5 MG EC tablet Take 15 mg by mouth daily as needed for moderate constipation.     Cholecalciferol (VITAMIN D) 50 MCG (2000 UT) tablet Take 2,000 Units by mouth daily.     Cranberry 125 MG TABS Take 125 mg by mouth every 12 (twelve) hours.     cyclobenzaprine (FLEXERIL) 10 MG tablet Take 1 tablet by mouth every 8 (eight) hours as needed.     diclofenac Sodium (VOLTAREN) 1 % GEL Apply 2 g topically as needed.     diphenhydrAMINE (BENADRYL) 25 MG tablet Take 25 mg by mouth every 6 (six) hours as needed for itching.      DULoxetine (CYMBALTA) 60 MG capsule Take 60 mg by mouth daily.     furosemide (LASIX) 40 MG tablet Take 40 mg by mouth daily.     gabapentin (NEURONTIN) 300 MG capsule Take 2 capsules (600 mg total) by mouth 3 (three) times daily. (Patient taking differently: Take 300 mg by  mouth 2 (two) times daily.) 540 capsule 1   Glucagon HCl 1 MG SOLR Inject 1 mg as directed daily as needed (low blood sugar).      insulin glargine (LANTUS) 100 UNIT/ML injection Inject 43 Units into the skin every evening.     LORazepam (ATIVAN) 1 MG tablet Take 1 mg by mouth every 8 (eight) hours as needed.     Melatonin 5 MG TABS Take 10 mg by mouth at bedtime.      naloxone (NARCAN) nasal spray 4 mg/0.1 mL Place 1 spray into the nose as needed.     oxybutynin (DITROPAN-XL) 10 MG 24 hr tablet Take 10 mg by mouth daily.      oxyCODONE-acetaminophen (PERCOCET) 10-325 MG tablet Take 1 tablet by mouth every 8 (eight) hours as needed for pain.     Polyethyl Glycol-Propyl Glycol (SYSTANE) 0.4-0.3 % SOLN Place 1 drop into both eyes every 12 (twelve) hours as needed (dry eyes).     polyethylene glycol (MIRALAX / GLYCOLAX) packet Take 17 g by mouth 2 (two) times daily.     potassium chloride (KLOR-CON) 10 MEQ tablet Take 10 mEq by mouth daily.     promethazine (PHENERGAN) 12.5 MG tablet Take 1 tablet (12.5 mg total) by mouth every  8 (eight) hours as needed for nausea or vomiting. No more than twice dialy. (Patient taking differently: Take 12.5 mg by mouth every 6 (six) hours as needed for nausea or vomiting.) 60 tablet 0   Saline 0.2 % SOLN Place 1 spray into the nose every 4 (four) hours as needed (nasal dryness/irritation).      senna (SENOKOT) 8.6 MG TABS tablet Take 1 tablet by mouth every 12 (twelve) hours.     sitaGLIPtin (JANUVIA) 100 MG tablet Take 100 mg by mouth daily.     Tiotropium Bromide-Olodaterol (STIOLTO RESPIMAT) 2.5-2.5 MCG/ACT AERS Inhale 2 puffs into the lungs daily.     traZODone (DESYREL) 100 MG tablet Take 200 mg by mouth at bedtime.     witch hazel-glycerin (TUCKS) pad Apply 1 application topically as needed for itching or irritation.     dextromethorphan-guaiFENesin (ROBITUSSIN-DM) 10-100 MG/5ML liquid Take 20 mLs by mouth every 4 (four) hours as needed for cough.      pantoprazole (PROTONIX) 40 MG tablet Take 1 tablet (40 mg total) by mouth 2 (two) times daily before a meal. 180 tablet 3   No current facility-administered medications for this visit.    Allergies as of 06/21/2021 - Review Complete 06/21/2021  Allergen Reaction Noted   Reglan [metoclopramide]  06/21/2021    ROS:  General: Negative for anorexia, weight loss, fever, chills, fatigue, weakness. ENT: Negative for hoarseness, difficulty swallowing , nasal congestion. CV: Negative for chest pain, angina, palpitations, dyspnea on exertion, peripheral edema.  Respiratory: Negative for dyspnea at rest, dyspnea on exertion, cough, sputum, wheezing.  GI: See history of present illness. GU:  Negative for dysuria, hematuria, urinary incontinence, urinary frequency, nocturnal urination.  Endo: Negative for unusual weight change.    Physical Examination:   BP 139/81   Pulse 83   Temp 97.7 F (36.5 C) (Temporal)   Ht 5\' 2"  (1.575 m)   Wt 218 lb (98.9 kg)   BMI 39.87 kg/m   General: Well-nourished, well-developed in no acute distress.  Eyes: No icterus. Mouth: masked Lungs: Clear to auscultation bilaterally.  Heart: Regular rate and rhythm, no murmurs rubs or gallops.  Abdomen: Bowel sounds are normal, nontender, nondistended, no hepatosplenomegaly or masses, no abdominal bruits or hernia , no rebound or guarding.   Extremities: No lower extremity edema. No clubbing or deformities. Neuro: Alert and oriented x 4   Skin: Warm and dry, no jaundice.   Psych: Alert and cooperative, normal mood and affect.     Assessment:     Plan:

## 2021-06-21 NOTE — Telephone Encounter (Signed)
[facility] and spoke with Tresa Endo. Gave appt details for abd [place] at [facility] for 10/13 at 10:30am, arrival 10:15am, npo midnight. Also provided with # if needs to r/s.

## 2021-06-21 NOTE — Patient Instructions (Signed)
Complete ultrasound of abdomen at [facility] (not facility) to evaluate epigastric/right upper quadrant abdominal pain, prior history of hepatitis C with questionable early cirrhosis on previous imaging. Obtain labs including CBC, CMet, lipase. Increase pantoprazole to 40 mg 30 minutes before breakfast and 30 minutes before evening meal. Return to the office in 6 months or call sooner if abdominal pain does not improve.

## 2021-06-29 ENCOUNTER — Other Ambulatory Visit (HOSPITAL_COMMUNITY): Payer: Medicare Other

## 2021-07-04 ENCOUNTER — Ambulatory Visit (HOSPITAL_COMMUNITY)
Admission: RE | Admit: 2021-07-04 | Discharge: 2021-07-04 | Disposition: A | Discharge status: Home / Self Care | Payer: Medicare Other | Source: Ambulatory Visit | Attending: Gastroenterology | Admitting: Gastroenterology

## 2021-07-04 ENCOUNTER — Other Ambulatory Visit: Payer: Self-pay

## 2021-07-04 DIAGNOSIS — B182 Chronic viral hepatitis C: Secondary | ICD-10-CM | POA: Diagnosis present

## 2021-07-04 DIAGNOSIS — K3184 Gastroparesis: Secondary | ICD-10-CM | POA: Insufficient documentation

## 2021-07-04 DIAGNOSIS — R1013 Epigastric pain: Secondary | ICD-10-CM | POA: Diagnosis present

## 2021-07-04 DIAGNOSIS — K219 Gastro-esophageal reflux disease without esophagitis: Secondary | ICD-10-CM | POA: Insufficient documentation

## 2021-07-07 ENCOUNTER — Encounter (HOSPITAL_COMMUNITY): Payer: Self-pay | Admitting: Emergency Medicine

## 2021-07-07 ENCOUNTER — Other Ambulatory Visit: Payer: Self-pay

## 2021-07-07 ENCOUNTER — Emergency Department (HOSPITAL_COMMUNITY)
Admission: EM | Admit: 2021-07-07 | Discharge: 2021-07-08 | Disposition: A | Discharge status: Skilled Nursing Facility | Payer: Medicare Other | Attending: Emergency Medicine | Admitting: Emergency Medicine

## 2021-07-07 DIAGNOSIS — Z794 Long term (current) use of insulin: Secondary | ICD-10-CM | POA: Diagnosis not present

## 2021-07-07 DIAGNOSIS — J449 Chronic obstructive pulmonary disease, unspecified: Secondary | ICD-10-CM | POA: Insufficient documentation

## 2021-07-07 DIAGNOSIS — I10 Essential (primary) hypertension: Secondary | ICD-10-CM | POA: Insufficient documentation

## 2021-07-07 DIAGNOSIS — Z7982 Long term (current) use of aspirin: Secondary | ICD-10-CM | POA: Insufficient documentation

## 2021-07-07 DIAGNOSIS — R059 Cough, unspecified: Secondary | ICD-10-CM | POA: Diagnosis present

## 2021-07-07 DIAGNOSIS — E119 Type 2 diabetes mellitus without complications: Secondary | ICD-10-CM | POA: Insufficient documentation

## 2021-07-07 DIAGNOSIS — F17210 Nicotine dependence, cigarettes, uncomplicated: Secondary | ICD-10-CM | POA: Diagnosis not present

## 2021-07-07 DIAGNOSIS — Z79899 Other long term (current) drug therapy: Secondary | ICD-10-CM | POA: Insufficient documentation

## 2021-07-07 DIAGNOSIS — R519 Headache, unspecified: Secondary | ICD-10-CM | POA: Diagnosis not present

## 2021-07-07 DIAGNOSIS — Z7951 Long term (current) use of inhaled steroids: Secondary | ICD-10-CM | POA: Insufficient documentation

## 2021-07-07 DIAGNOSIS — J9801 Acute bronchospasm: Secondary | ICD-10-CM | POA: Diagnosis not present

## 2021-07-07 NOTE — ED Triage Notes (Signed)
Pt from [facility] with c/o cough. Per RRN @ facility, pt was seen by MD today for this via tele-visit and cough medicine was increased and chest xray was performed which was "clear". Pt continues to smoke and has done so multiple times today at facility.

## 2021-07-07 NOTE — ED Notes (Signed)
Pt repeatedly asking for pain medication. Per pt the new MD is not giving her any pain medication any more.

## 2021-07-08 ENCOUNTER — Emergency Department (HOSPITAL_COMMUNITY): Payer: Medicare Other

## 2021-07-08 DIAGNOSIS — J9801 Acute bronchospasm: Secondary | ICD-10-CM | POA: Diagnosis not present

## 2021-07-08 LAB — CBG MONITORING, ED: Glucose-Capillary: 277 mg/dL — ABNORMAL HIGH (ref 70–99)

## 2021-07-08 MED ORDER — IPRATROPIUM BROMIDE 0.02 % IN SOLN
0.5000 mg | Freq: Once | RESPIRATORY_TRACT | Status: AC
  Administered 2021-07-08: 0.5 mg via RESPIRATORY_TRACT
  Filled 2021-07-08: qty 2.5

## 2021-07-08 MED ORDER — ALBUTEROL SULFATE (2.5 MG/3ML) 0.083% IN NEBU
5.0000 mg | INHALATION_SOLUTION | Freq: Once | RESPIRATORY_TRACT | Status: AC
  Administered 2021-07-08: 5 mg via RESPIRATORY_TRACT
  Filled 2021-07-08: qty 6

## 2021-07-08 MED ORDER — PREDNISONE 50 MG PO TABS: ORAL_TABLET | ORAL | 0 refills | Status: DC

## 2021-07-08 MED ORDER — PREDNISONE 50 MG PO TABS
60.0000 mg | ORAL_TABLET | Freq: Once | ORAL | Status: AC
  Administered 2021-07-08: 60 mg via ORAL
  Filled 2021-07-08: qty 1

## 2021-07-08 MED ORDER — OXYCODONE-ACETAMINOPHEN 5-325 MG PO TABS
2.0000 | ORAL_TABLET | Freq: Once | ORAL | Status: AC
  Administered 2021-07-08: 2 via ORAL
  Filled 2021-07-08: qty 2

## 2021-07-08 NOTE — ED Provider Notes (Signed)
[facility] EMERGENCY DEPARTMENT Provider Note   CSN: [id number] Arrival date & time: 07/07/21  2300     History Chief Complaint  Patient presents with   Cough    Gloria Chapman is a 65 y.o. female.  The history is provided by the patient.  Cough Cough characteristics:  Productive Sputum characteristics:  Green Severity:  Moderate Onset quality:  Gradual Duration:  3 days Timing:  Intermittent Progression:  Worsening Chronicity:  New Smoker: yes   Relieved by:  Nothing Worsened by:  Nothing Associated symptoms: headaches, shortness of breath and sinus congestion   Associated symptoms: no fever   Associated symptoms comment:  Chest tightness Patient history of COPD, diabetes, fibromyalgia, chronic pain presents from nursing facility for cough.  Patient reports increasing cough for the past 3 days.  She reports she is coughing up green sputum.  No hemoptysis.  She reports chest tightness, wheezing and shortness of breath.  She also reports headache and chest wall pain from coughing    Past Medical History:  Diagnosis Date   Anemia    Anxiety    Arthritis    COPD (chronic obstructive pulmonary disease) (HCC)    Depression    Diabetes mellitus without complication (HCC)    Dyspnea    Fibromyalgia    GERD (gastroesophageal reflux disease)    Headache    Heart failure (HCC)    Heart murmur    Hepatitis    History of Clostridium difficile infection    History of methicillin resistant staphylococcus aureus (MRSA)    Insomnia    Overactive bladder    Pneumonia     Patient Active Problem List   Diagnosis Date Noted   Gastroparesis 12/12/2020   Pelvic pain 09/21/2020   Routine Papanicolaou smear 09/21/2020   N&V (nausea and vomiting) 11/25/2019   Loss of weight 11/25/2019   Chronic hepatitis C (HCC) 06/16/2019   Generalized abdominal pain 06/16/2019   Hypertension 03/17/2018   Abdominal pain, epigastric 10/28/2017   Nausea without vomiting 10/28/2017   GERD  (gastroesophageal reflux disease) 10/28/2017   Colon cancer screening 10/28/2017    Past Surgical History:  Procedure Laterality Date   APPENDECTOMY     BACK SURGERY     twice   DIAGNOSTIC LAPAROSCOPY     checking for endometriosis   ESOPHAGOGASTRODUODENOSCOPY (EGD) WITH PROPOFOL N/A 11/21/2017   Dr. Jena Gauss: Erosive reflux esophagitis, low-grade narrowing Schatzki ring status post dilation, small hiatal hernia.   ESOPHAGOGASTRODUODENOSCOPY (EGD) WITH PROPOFOL N/A 07/25/2020   Dr. Jena Gauss: Oral candidiasis, likely early Candida esophagitis, normal stomach and duodenum.  Esophagus dilated due to history of dysphagia.   FEMUR FRACTURE SURGERY Right    Rods placed   HEMORRHOID SURGERY     INNER EAR SURGERY Left    MALONEY DILATION N/A 11/21/2017   Procedure: Elease Hashimoto DILATION;  Surgeon: Corbin Ade, MD;  Location: [facility];  Service: Endoscopy;  Laterality: N/A;   MALONEY DILATION N/A 07/25/2020   Procedure: Elease Hashimoto DILATION;  Surgeon: Corbin Ade, MD;  Location: [facility];  Service: Endoscopy;  Laterality: N/A;   NASAL SINUS SURGERY     right hip surgery     [place]: "took my hip out due to MRSA" occurred after hip replacements     OB History     Gravida  3   Para  1   Term  1   Preterm      AB  2   Living  1  SAB      IAB      Ectopic      Multiple      Live Births  1           Family History  Problem Relation Age of Onset   Cancer Father        unknown   Cancer Mother    Colon cancer Neg Hx     Social History   Tobacco Use   Smoking status: Every Day    Packs/day: 2.00    Years: 50.00    Pack years: 100.00    Types: Cigarettes   Smokeless tobacco: Never   Tobacco comments:    0.5PPD- 11/17/2020  Vaping Use   Vaping Use: Never used  Substance Use Topics   Alcohol use: No   Drug use: No    Home Medications Prior to Admission medications   Medication Sig Start Date End Date Taking? Authorizing Provider  predniSONE  (DELTASONE) 50 MG tablet 1 tablet PO QD X4 days 07/08/21  Yes Zadie Rhine, MD  acetaminophen (TYLENOL) 500 MG tablet Take 1,000 mg by mouth every 8 (eight) hours as needed.    [provider]  albuterol (VENTOLIN HFA) 108 (90 Base) MCG/ACT inhaler Inhale 2 puffs into the lungs every 4 (four) hours as needed for wheezing or shortness of breath. 05/30/20   [provider]  aspirin EC 81 MG tablet Take 81 mg by mouth daily. Swallow whole.    [provider]  benzonatate (TESSALON) 100 MG capsule Take 100 mg by mouth every 6 (six) hours as needed for cough.    [provider]  bisacodyl (DULCOLAX) 5 MG EC tablet Take 15 mg by mouth daily as needed for moderate constipation.    [provider]  Cholecalciferol (VITAMIN D) 50 MCG (2000 UT) tablet Take 2,000 Units by mouth daily.    [provider]  Cranberry 125 MG TABS Take 125 mg by mouth every 12 (twelve) hours.    [provider]  cyclobenzaprine (FLEXERIL) 10 MG tablet Take 1 tablet by mouth every 8 (eight) hours as needed. 01/07/21   [provider]  dextromethorphan-guaiFENesin (ROBITUSSIN-DM) 10-100 MG/5ML liquid Take 20 mLs by mouth every 4 (four) hours as needed for cough.    [provider]  diclofenac Sodium (VOLTAREN) 1 % GEL Apply 2 g topically as needed.    [provider]  diphenhydrAMINE (BENADRYL) 25 MG tablet Take 25 mg by mouth every 6 (six) hours as needed for itching.     [provider]  DULoxetine (CYMBALTA) 60 MG capsule Take 60 mg by mouth daily.    [provider]  furosemide (LASIX) 40 MG tablet Take 40 mg by mouth daily.    [provider]  gabapentin (NEURONTIN) 300 MG capsule Take 2 capsules (600 mg total) by mouth 3 (three) times daily. Patient taking differently: Take 300 mg by mouth 2 (two) times daily. 03/17/21   Lazaro Arms, MD  Glucagon HCl 1 MG SOLR Inject 1 mg as directed daily as needed (low blood  sugar).     [provider]  insulin glargine (LANTUS) 100 UNIT/ML injection Inject 43 Units into the skin every evening.    [provider]  LORazepam (ATIVAN) 1 MG tablet Take 1 mg by mouth every 8 (eight) hours as needed.    [provider]  Melatonin 5 MG TABS Take 10 mg by mouth at bedtime.     [provider]  naloxone (NARCAN) nasal spray 4 mg/0.1 mL Place 1 spray into the nose as needed.    [provider]  oxybutynin (DITROPAN-XL) 10 MG 24 hr tablet Take 10 mg by mouth daily.     [provider]  oxyCODONE-acetaminophen (PERCOCET) 10-325 MG tablet Take 1 tablet by mouth every 8 (eight) hours as needed for pain.    [provider]  pantoprazole (PROTONIX) 40 MG tablet Take 1 tablet (40 mg total) by mouth 2 (two) times daily before a meal. 06/21/21   Tiffany Kocher, PA-C  Polyethyl Glycol-Propyl Glycol (SYSTANE) 0.4-0.3 % SOLN Place 1 drop into both eyes every 12 (twelve) hours as needed (dry eyes).    [provider]  polyethylene glycol (MIRALAX / GLYCOLAX) packet Take 17 g by mouth 2 (two) times daily.    [provider]  potassium chloride (KLOR-CON) 10 MEQ tablet Take 10 mEq by mouth daily. 01/26/20   [provider]  promethazine (PHENERGAN) 12.5 MG tablet Take 1 tablet (12.5 mg total) by mouth every 8 (eight) hours as needed for nausea or vomiting. No more than twice dialy. Patient taking differently: Take 12.5 mg by mouth every 6 (six) hours as needed for nausea or vomiting. 04/26/20   Tiffany Kocher, PA-C  Saline 0.2 % SOLN Place 1 spray into the nose every 4 (four) hours as needed (nasal dryness/irritation).     [provider]  senna (SENOKOT) 8.6 MG TABS tablet Take 1 tablet by mouth every 12 (twelve) hours.    [provider]  sitaGLIPtin (JANUVIA) 100 MG tablet Take 100 mg by mouth daily.    [provider]  Tiotropium Bromide-Olodaterol (STIOLTO RESPIMAT) 2.5-2.5  MCG/ACT AERS Inhale 2 puffs into the lungs daily.    [provider]  traZODone (DESYREL) 100 MG tablet Take 200 mg by mouth at bedtime.    [provider]  witch hazel-glycerin (TUCKS) pad Apply 1 application topically as needed for itching or irritation.    [provider]    Allergies    Reglan [metoclopramide]  Review of Systems   Review of Systems  Constitutional:  Negative for fever.  Respiratory:  Positive for cough and shortness of breath.   Gastrointestinal:  Negative for abdominal pain.  Neurological:  Positive for headaches.  All other systems reviewed and are negative.  Physical Exam Updated Vital Signs BP 120/73   Pulse 87   Temp 98.2 F (36.8 C) (Oral)   Resp 17   Ht 1.575 m (5\' 2" )   Wt 99 kg   SpO2 96%   BMI 39.92 kg/m   Physical Exam CONSTITUTIONAL: Chronically ill-appearing HEAD: Normocephalic/atraumatic EYES: EOMI/PERRL ENMT: Mucous membranes moist NECK: supple no meningeal signs SPINE/BACK:entire spine nontender CV: S1/S2 noted, no murmurs/rubs/gallops noted LUNGS: Coarse wheezing bilaterally, no acute distress ABDOMEN: soft, nontender, no rebound or guarding, bowel sounds noted throughout abdomen GU:no cva tenderness NEURO: Pt is awake/alert/appropriate, moves all extremitiesx4.  No facial droop.   EXTREMITIES: pulses normal/equal, full ROM SKIN: warm, color normal PSYCH: no abnormalities of mood noted, alert and oriented to situation  ED Results / Procedures / Treatments   Labs (all labs ordered are listed, but only abnormal results are displayed) Labs Reviewed  CBG MONITORING, ED - Abnormal; Notable for the following components:      Result Value   Glucose-Capillary 277 (*)    All other components within normal limits    EKG EKG Interpretation  Date/Time:  Saturday July 08 2021 00:27:17 EDT Ventricular Rate:  80 PR Interval:  168 QRS Duration: 79 QT Interval:  453 QTC Calculation: 523 R  Axis:   18 Text Interpretation: Sinus rhythm Low voltage, extremity and precordial leads Prolonged QT interval Confirmed by Zadie Rhine ([id number]) on 07/08/2021 12:32:06 AM  Radiology DG Chest Port 1 View  Result Date: 07/08/2021 CLINICAL DATA:  Cough. EXAM: PORTABLE CHEST 1 VIEW COMPARISON:  Chest radiograph dated 01/10/2015. FINDINGS: Focal consolidation, pleural effusion or pneumothorax. Cardiac silhouette is within limits. No acute osseous pathology. Lower cervical ACDF. Degenerative changes of the left shoulder. IMPRESSION: No active disease. Electronically Signed   By: Elgie Collard M.D.   On: 07/08/2021 00:24    Procedures Procedures   Medications Ordered in ED Medications  albuterol (PROVENTIL) (2.5 MG/3ML) 0.083% nebulizer solution 5 mg (5 mg Nebulization Given 07/08/21 0036)  ipratropium (ATROVENT) nebulizer solution 0.5 mg (0.5 mg Nebulization Given 07/08/21 0035)  predniSONE (DELTASONE) tablet 60 mg (60 mg Oral Given 07/08/21 0021)  oxyCODONE-acetaminophen (PERCOCET/ROXICET) 5-325 MG per tablet 2 tablet (2 tablets Oral Given 07/08/21 0021)    ED Course  I have reviewed the triage vital signs and the nursing notes.  Pertinent labs & imaging results that were available during my care of the patient were reviewed by me and considered in my medical decision making (see chart for details).    MDM Rules/Calculators/A&P                           Patient presents for nursing facility for cough.  Patient reports she has a history of difficulty walking due to issues with her right hip She reports the cough has been worsening and is not improving even though she is on doxycycline.  Apparently she is still smoking Patient reports frustration with nursing staff at facility. Patient is demanding pain medications and is requesting Dilaudid I advised her that I would follow her MAR and give her Percocet as prescribed at nursing home She also be given albuterol/Atrovent and  prednisone. Chest x-ray and EKG have been ordered 1:23 AM I have personally reviewed the chest x-ray is negative. Patient is stable, no hypoxia.  Will send back to nursing home.  We will add on prednisone for 4 days and advised patient to check her glucose frequently.  Final Clinical Impression(s) / ED Diagnoses Final diagnoses:  Acute bronchospasm    Rx / DC Orders ED Discharge Orders          Ordered    predniSONE (DELTASONE) 50 MG tablet        07/08/21 0109             Zadie Rhine, MD 07/08/21 [id number]

## 2021-07-10 ENCOUNTER — Encounter: Payer: Self-pay | Admitting: Gastroenterology

## 2021-07-10 ENCOUNTER — Encounter: Payer: Self-pay | Admitting: Internal Medicine

## 2021-07-18 ENCOUNTER — Telehealth: Payer: Self-pay | Admitting: Gastroenterology

## 2021-07-18 NOTE — Telephone Encounter (Signed)
Tequila from [facility] called after receiving letter regarding pt's [place] results. Nurse Brendia Sacks was given the results over the phone and has requested a copy be sent to the [facility] to be put in the pt's chart there for the Dr. Christella Hartigan to [place] to fax copy of recent US to the [employer] at fax # [phone].

## 2021-07-25 ENCOUNTER — Ambulatory Visit: Payer: Medicare Other | Admitting: Obstetrics & Gynecology

## 2021-07-26 ENCOUNTER — Other Ambulatory Visit: Payer: Self-pay

## 2021-07-26 DIAGNOSIS — R1013 Epigastric pain: Secondary | ICD-10-CM

## 2021-07-26 DIAGNOSIS — R110 Nausea: Secondary | ICD-10-CM

## 2021-07-31 ENCOUNTER — Encounter: Payer: Self-pay | Admitting: Family Medicine

## 2021-08-17 ENCOUNTER — Ambulatory Visit: Payer: Medicare Other | Admitting: General Surgery

## 2021-08-21 ENCOUNTER — Ambulatory Visit: Payer: Medicare Other | Admitting: Obstetrics & Gynecology

## 2021-08-29 ENCOUNTER — Encounter: Payer: Self-pay | Admitting: General Surgery

## 2021-08-29 ENCOUNTER — Other Ambulatory Visit: Payer: Self-pay

## 2021-08-29 ENCOUNTER — Ambulatory Visit (INDEPENDENT_AMBULATORY_CARE_PROVIDER_SITE_OTHER): Payer: Medicare Other | Admitting: General Surgery

## 2021-08-29 VITALS — BP 134/81 | HR 76 | Temp 98.6°F | Resp 18 | Ht 62.0 in | Wt 222.0 lb

## 2021-08-29 DIAGNOSIS — K8020 Calculus of gallbladder without cholecystitis without obstruction: Secondary | ICD-10-CM | POA: Insufficient documentation

## 2021-08-29 DIAGNOSIS — K828 Other specified diseases of gallbladder: Secondary | ICD-10-CM | POA: Insufficient documentation

## 2021-08-29 NOTE — Progress Notes (Signed)
[facility] History and Physical  Reason for Referral: Gallstones  Referring Physician: Renata Caprice, DO   Chief Complaint   New Patient (Initial Visit)     Gloria Chapman is a 65 y.o. female.  HPI:  Gloria Chapman I a 65 yo who comes in from her assisted living with severe RUQ pain that she says has been worsening in the last few weeks. She has multiple medical issues including fibromyalgia on chronic pain meds, COPD and issue with her R hip after repair and MRSA infection. She is wheelchair bound and can pivot but does not walk. She has been followed by GI for some time for gastroparesis, unable to tolerate reglan it sounds like, GERD and Hep C that was treated. She had EGD in the past with candidiasis and esophagitis, and gastric emptying studying with gastroparesis.   She says that she has been having worsening pain and tenderness in the RUQ. She has some nausea and says the pain is sharp and stabbing at times. She use to smoke more but now only smoke a few cigarettes a day.   Past Medical History:  Diagnosis Date   Anemia    Anxiety    Arthritis    COPD (chronic obstructive pulmonary disease) (HCC)    Depression    Diabetes mellitus without complication (HCC)    Dyspnea    Fibromyalgia    GERD (gastroesophageal reflux disease)    Headache    Heart failure (HCC)    Heart murmur    Hepatitis    History of Clostridium difficile infection    History of methicillin resistant staphylococcus aureus (MRSA)    Insomnia    Overactive bladder    Pneumonia     Past Surgical History:  Procedure Laterality Date   APPENDECTOMY     BACK SURGERY     twice   DIAGNOSTIC LAPAROSCOPY     checking for endometriosis   ESOPHAGOGASTRODUODENOSCOPY (EGD) WITH PROPOFOL N/A 11/21/2017   Dr. Gala Romney: Erosive reflux esophagitis, low-grade narrowing Schatzki ring status post dilation, small hiatal hernia.   ESOPHAGOGASTRODUODENOSCOPY (EGD) WITH PROPOFOL N/A 07/25/2020   Dr. Gala Romney: Oral  candidiasis, likely early Candida esophagitis, normal stomach and duodenum.  Esophagus dilated due to history of dysphagia.   FEMUR FRACTURE SURGERY Right    Rods placed   HEMORRHOID SURGERY     INNER EAR SURGERY Left    MALONEY DILATION N/A 11/21/2017   Procedure: Venia Minks DILATION;  Surgeon: Daneil Dolin, MD;  [facility];  Service: Endoscopy;  Laterality: N/A;   MALONEY DILATION N/A 07/25/2020   Procedure: Venia Minks DILATION;  Surgeon: Daneil Dolin, MD;  Location: [facility];  Service: Endoscopy;  Laterality: N/A;   NASAL SINUS SURGERY     right hip surgery     [place]: "took my hip out due to MRSA" occurred after hip replacements    Family History  Problem Relation Age of Onset   Cancer Father        unknown   Cancer Mother    Colon cancer Neg Hx     Social History   Tobacco Use   Smoking status: Every Day    Packs/day: 2.00    Years: 50.00    Pack years: 100.00    Types: Cigarettes   Smokeless tobacco: Never   Tobacco comments:    0.5PPD- 11/17/2020  Vaping Use   Vaping Use: Never used  Substance Use Topics   Alcohol use: No   Drug  use: No  ° ° °Medications: I have reviewed the patient's current medications. °Allergies as of 08/29/2021   ° °   Reactions  ° Reglan [metoclopramide]   ° Tardive dyskinesia per patient  ° °  ° °  °Medication List  °  ° °  ° Accurate as of August 29, 2021 11:13 AM. If you have any questions, ask your nurse or doctor.  °  °  ° °  ° °acetaminophen 500 MG tablet °Commonly known as: TYLENOL °Take 1,000 mg by mouth every 8 (eight) hours as needed. °  °albuterol 108 (90 Base) MCG/ACT inhaler °Commonly known as: VENTOLIN HFA °Inhale 2 puffs into the lungs every 4 (four) hours as needed for wheezing or shortness of breath. °  °aspirin EC 81 MG tablet °Take 81 mg by mouth daily. Swallow whole. °  °benzonatate 100 MG capsule °Commonly known as: TESSALON °Take 100 mg by mouth every 6 (six) hours as needed for cough. °  °bisacodyl 5 MG EC  tablet °Commonly known as: DULCOLAX °Take 15 mg by mouth daily as needed for moderate constipation. °  °Cranberry 125 MG Tabs °Take 125 mg by mouth every 12 (twelve) hours. °  °cyclobenzaprine 10 MG tablet °Commonly known as: FLEXERIL °Take 1 tablet by mouth every 8 (eight) hours as needed. °  °dextromethorphan-guaiFENesin 10-100 MG/5ML liquid °Commonly known as: ROBITUSSIN-DM °Take 20 mLs by mouth every 4 (four) hours as needed for cough. °  °diclofenac Sodium 1 % Gel °Commonly known as: VOLTAREN °Apply 2 g topically as needed. °  °diphenhydrAMINE 25 MG tablet °Commonly known as: BENADRYL °Take 25 mg by mouth every 6 (six) hours as needed for itching. °  °DULoxetine 60 MG capsule °Commonly known as: CYMBALTA °Take 60 mg by mouth daily. °  °furosemide 40 MG tablet °Commonly known as: LASIX °Take 40 mg by mouth daily. °  °gabapentin 300 MG capsule °Commonly known as: NEURONTIN °Take 2 capsules (600 mg total) by mouth 3 (three) times daily. °What changed:  °how much to take °when to take this °  °Glucagon HCl 1 MG Solr °Inject 1 mg as directed daily as needed (low blood sugar). °  °insulin glargine 100 UNIT/ML injection °Commonly known as: LANTUS °Inject 43 Units into the skin every evening. °  °LORazepam 1 MG tablet °Commonly known as: ATIVAN °Take 1 mg by mouth every 8 (eight) hours as needed. °  °melatonin 5 MG Tabs °Take 10 mg by mouth at bedtime. °  °naloxone 4 MG/0.1ML Liqd nasal spray kit °Commonly known as: NARCAN °Place 1 spray into the nose as needed. °  °oxybutynin 10 MG 24 hr tablet °Commonly known as: DITROPAN-XL °Take 10 mg by mouth daily. °  °oxyCODONE-acetaminophen 10-325 MG tablet °Commonly known as: PERCOCET °Take 1 tablet by mouth every 8 (eight) hours as needed for pain. °  °pantoprazole 40 MG tablet °Commonly known as: PROTONIX °Take 1 tablet (40 mg total) by mouth 2 (two) times daily before a meal. °  °polyethylene glycol 17 g packet °Commonly known as: MIRALAX / GLYCOLAX °Take 17 g by mouth 2  (two) times daily. °  °potassium chloride 10 MEQ tablet °Commonly known as: KLOR-CON M °Take 10 mEq by mouth daily. °  °predniSONE 50 MG tablet °Commonly known as: DELTASONE °1 tablet PO QD X4 days °  °promethazine 12.5 MG tablet °Commonly known as: PHENERGAN °Take 1 tablet (12.5 mg total) by mouth every 8 (eight) hours as needed for nausea or vomiting. No more than twice   dialy. What changed:  when to take this additional instructions   Saline Spray 0.2 % Soln Place 1 spray into the nose every 4 (four) hours as needed (nasal dryness/irritation).   senna 8.6 MG Tabs tablet Commonly known as: SENOKOT Take 1 tablet by mouth every 12 (twelve) hours.   sitaGLIPtin 100 MG tablet Commonly known as: JANUVIA Take 100 mg by mouth daily.   Stiolto Respimat 2.5-2.5 MCG/ACT Aers Generic drug: Tiotropium Bromide-Olodaterol Inhale 2 puffs into the lungs daily.   Systane 0.4-0.3 % Soln Generic drug: Polyethyl Glycol-Propyl Glycol Place 1 drop into both eyes every 12 (twelve) hours as needed (dry eyes).   traZODone 100 MG tablet Commonly known as: DESYREL Take 200 mg by mouth at bedtime.   Vitamin D 50 MCG (2000 UT) tablet Take 2,000 Units by mouth daily.   witch hazel-glycerin pad Commonly known as: TUCKS Apply 1 application topically as needed for itching or irritation.         ROS:  A comprehensive review of systems was negative except for: Ears, nose, mouth, throat, and face: positive for sinus issues Respiratory: positive for wheezing and COPD Gastrointestinal: positive for abdominal pain and nausea Musculoskeletal: positive for back pain, neck pain, and joint pain  Blood pressure 134/81, pulse 76, temperature 98.6 F (37 C), temperature source Other (Comment), resp. rate 18, height _0  (1.575 m), weight 222 lb (100.7 kg), SpO2 94 %. Physical Exam Vitals reviewed.  Constitutional:      Appearance: Normal appearance.  HENT:     Head: Normocephalic.  Eyes:     Extraocular  Movements: Extraocular movements intact.  Cardiovascular:     Rate and Rhythm: Normal rate and regular rhythm.  Pulmonary:     Effort: Pulmonary effort is normal.     Breath sounds: Normal breath sounds.  Abdominal:     General: There is no distension.     Palpations: Abdomen is soft.     Tenderness: There is abdominal tenderness in the right upper quadrant and epigastric area.  Musculoskeletal:        General: Tenderness present.     Cervical back: Normal range of motion.     Comments: In wheelchair  Skin:    General: Skin is warm.  Neurological:     General: No focal deficit present.     Mental Status: She is alert and oriented to person, place, and time.  Psychiatric:        Mood and Affect: Mood normal.        Behavior: Behavior normal.    Results: CLINICAL DATA:  Abdominal pain, nausea and reflux for more than 1 year, took Zofran the morning of this exam   EXAM: NUCLEAR MEDICINE GASTRIC EMPTYING SCAN   TECHNIQUE: After oral ingestion of radiolabeled meal, sequential abdominal images were obtained for 4 hours. Percentage of activity emptying the stomach was calculated at 1 hour, 2 hour, 3 hour, and 4 hours.   RADIOPHARMACEUTICALS:  2.1 mCi Tc-61msulfur colloid in standardized meal   COMPARISON:  None   FINDINGS: Expected location of the stomach in the left upper quadrant.   Ingested meal empties the stomach slowly and incompletely over the course of the study.   22% emptied at 1 hr ( normal >= 10%)   27% emptied at 2 hr ( normal >= 40%)   32% emptied at 3 hr ( normal >= 70%)   55% emptied at 4 hr ( normal >= 90%)   IMPRESSION: Abnormal delayed gastric  emptying.     Electronically Signed   By: Lavonia Dana M.D.   On: 06/25/2019 14:05  CLINICAL DATA:  Epigastric pain, history of hepatitis C, questionable early cirrhosis on prior CT.   EXAM: ABDOMEN ULTRASOUND COMPLETE   COMPARISON:  CT December 10, 2019   FINDINGS: Gallbladder: Biliary sludge. No  gallstones or wall thickening visualized. No sonographic Murphy sign noted by sonographer.   Common bile duct: Diameter: 6 mm.   Liver: No focal lesion identified. Diffusely increased parenchymal echogenicity. No discrete contour nodularity. Portal vein is patent on color Doppler imaging with normal direction of blood flow towards the liver.   IVC: No abnormality visualized.   Pancreas: Visualized portion unremarkable.   Spleen: Size and appearance within normal limits.   Right Kidney: Length: 9.5 cm. Echogenicity within normal limits. No mass or hydronephrosis visualized.   Left Kidney: Length: 10.8 cm. Echogenicity within normal limits. No mass or hydronephrosis visualized.   Abdominal aorta: No aneurysm visualized.   Other findings: None.   IMPRESSION: 1. Increased liver echogenicity, no discrete contour nodularity. This is a nonspecific finding but is most commonly seen with fatty infiltration of the liver or hepatocellular disease. No overt hepatic lesion visualized. 2. Biliary sludge. No cholelithiasis or findings of acute cholecystitis.     Electronically Signed   By: Dahlia Bailiff M.D.   On: 07/04/2021 21:55   CLINICAL DATA:  Epigastric pain, history of hepatitis C, questionable early cirrhosis on prior CT.   EXAM: ABDOMEN ULTRASOUND COMPLETE   COMPARISON:  CT December 10, 2019   FINDINGS: Gallbladder: Biliary sludge. No gallstones or wall thickening visualized. No sonographic Murphy sign noted by sonographer.   Common bile duct: Diameter: 6 mm.   Liver: No focal lesion identified. Diffusely increased parenchymal echogenicity. No discrete contour nodularity. Portal vein is patent on color Doppler imaging with normal direction of blood flow towards the liver.   IVC: No abnormality visualized.   Pancreas: Visualized portion unremarkable.   Spleen: Size and appearance within normal limits.   Right Kidney: Length: 9.5 cm. Echogenicity within normal  limits. No mass or hydronephrosis visualized.   Left Kidney: Length: 10.8 cm. Echogenicity within normal limits. No mass or hydronephrosis visualized.   Abdominal aorta: No aneurysm visualized.   Other findings: None.   IMPRESSION: 1. Increased liver echogenicity, no discrete contour nodularity. This is a nonspecific finding but is most commonly seen with fatty infiltration of the liver or hepatocellular disease. No overt hepatic lesion visualized. 2. Biliary sludge. No cholelithiasis or findings of acute cholecystitis.     Electronically Signed   By: Dahlia Bailiff M.D.   On: 07/04/2021 21:55  CLINICAL DATA:  Epigastric pain with intermittent vomiting for 2 months. Chronic hepatitis-C.   EXAM: CT ABDOMEN AND PELVIS WITH CONTRAST   TECHNIQUE: Multidetector CT imaging of the abdomen and pelvis was performed using the standard protocol following bolus administration of intravenous contrast.   CONTRAST:  176m OMNIPAQUE IOHEXOL 300 MG/ML  SOLN   COMPARISON:  CT pelvis from 05/15/2015 and CT abdomen [facility]   FINDINGS: Lower chest: Bibasilar atelectasis versus scarring. Stable mild distal esophageal wall thickening, esophagitis would be a common cause. Mild descending thoracic aortic atherosclerotic calcification.   Hepatobiliary: No significant abnormal arterial phase enhancement in the liver to suggest hepatocellular carcinoma. There is some mild prominence of the caudate lobe which can be a morphologic indicator of early cirrhosis, but no overt nodularity of the liver is  observed. The gallbladder appears unremarkable. The common hepatic duct is about 0.8 cm in diameter with the common bile duct about 0.6 cm in diameter, borderline dilated.   Pancreas: Unremarkable   Spleen: Unremarkable   Adrenals/Urinary Tract: 0.5 cm hypodense lesion of the left mid kidney posteriorly on image 53/3 is likely cysts but technically  too small to characterize. The adrenal glands appear normal. Urinary bladder unremarkable.   Stomach/Bowel: Unremarkable   Vascular/Lymphatic: Mild abdominal aortic atherosclerotic calcification near the bifurcation, with atherosclerosis of the iliac arteries. No pathologic adenopathy identified.   Reproductive: Unremarkable   Other: No supplemental non-categorized findings.   Musculoskeletal: Interval removal of the right hip prosthesis, with cephalad migration of the right femur. Absent right femoral neck and head. Irregular and flattened right acetabular region. Mildly accentuated but otherwise nonspecific density along the left pectineus muscle. Posterolateral rod and pedicle screw fixation at L4-L5-S1 bilaterally. Chronic appearing compression fractures at L1 and L3.   IMPRESSION: 1. A specific cause for the patient's epigastric pain and vomiting is not identified. There is some mild prominence of the caudate lobe of the liver which can be a morphologic indicator of early cirrhosis, but no overt nodularity of the liver is observed. 2. Stable mild distal esophageal wall thickening, esophagitis would be a common cause. 3. Interval removal of the right hip prosthesis, with cephalad migration of the right femur. Absent right femoral neck and head. Irregular and flattened right acetabular region. Mildly accentuated but otherwise nonspecific density along the left pectineus muscle. 4. Chronic compression fractures at L1 and L3.   Aortic Atherosclerosis (ICD10-I70.0).     Electronically Signed   By: Van Clines M.D.   On: 12/11/2019 09:11  Assessment & Plan:  Gloria Chapman is a 65 y.o. female with gastroparesis, prior esophagitis and some chronic pain on percocet 10 she reports. She does have RUQ pain that is worsening and nausea. She has stones on her [place]. Discussed with her that all her pain may not be from the gallstones. Discussed removal of the gallbladder to see  if that will help but no guarantees of pain relief given other issues.  PLAN: I counseled the patient about the indication, risks and benefits of laparoscopic cholecystectomy.  She understands there is a very small chance for bleeding, infection, injury to normal structures (including common bile duct), conversion to open surgery, persistent symptoms, evolution of postcholecystectomy diarrhea, need for secondary interventions, anesthesia reaction, cardiopulmonary issues and other risks not specifically detailed here. I described the expected recovery, the plan for follow-up and the restrictions during the recovery phase.  All questions were answered.   All questions were answered to the satisfaction of the patient.  Future Appointments  Date Time Provider [place]  09/15/2021  2:15 PM [facility] PAT 2 [facility] None  01/08/2022 10:00 AM Mahala Menghini, PA-C [facility]      Virl Cagey 08/29/2021, 11:13 AM

## 2021-08-29 NOTE — Patient Instructions (Signed)
Minimally Invasive Cholecystectomy °Minimally invasive cholecystectomy is surgery to remove the gallbladder. The gallbladder is a pear-shaped organ that lies beneath the liver on the right side of the body. The gallbladder stores bile, which is a fluid that helps the body digest fats. Cholecystectomy is often done to treat inflammation (irritation and swelling) of the gallbladder (cholecystitis). This condition is usually caused by a buildup of gallstones (cholelithiasis) in the gallbladder or when the fluid in the gall bladder becomes stagnant because gallstones get stuck in the ducts (tubes) and block the flow of bile. This can result in inflammation and pain. In severe cases, emergency surgery may be required. °This procedure is done through small incisions in the abdomen, instead of one large incision. It is also called laparoscopic surgery. A thin scope with a camera (laparoscope) is inserted through one incision. Then surgical instruments are inserted through the other incisions. In some cases, a minimally invasive surgery may need to be changed to a surgery that is done through a larger incision. This is called open surgery. °Tell a health care provider about: °Any allergies you have. °All medicines you are taking, including vitamins, herbs, eye drops, creams, and over-the-counter medicines. °Any problems you or family members have had with anesthetic medicines. °Any bleeding problems you have. °Any surgeries you have had. °Any medical conditions you have. °Whether you are pregnant or may be pregnant. °What are the risks? °Generally, this is a safe procedure. However, problems may occur, including: °Infection. °Bleeding. °Allergic reactions to medicines. °Damage to nearby structures or organs. °A gallstone remaining in the common bile duct. The common bile duct carries bile from the gallbladder to the small intestine. °A bile leak from the liver or cystic duct after your gallbladder is removed. °What happens  before the procedure? °Medicines °Ask your health care provider about: °Changing or stopping your regular medicines. This is especially important if you are taking diabetes medicines or blood thinners. °Taking medicines such as aspirin and ibuprofen. These medicines can thin your blood. Do not take these medicines unless your health care provider tells you to take them. °Taking over-the-counter medicines, vitamins, herbs, and supplements. °General instructions °If you will be going home right after the procedure, plan to have a responsible adult: °Take you home from the hospital or clinic. You will not be allowed to drive. °Care for you for the time you are told. °Do not use any products that contain nicotine or tobacco for at least 4 weeks before the procedure. These products include cigarettes, chewing tobacco, and vaping devices, such as e-cigarettes. If you need help quitting, ask your health care provider. °Ask your health care provider: °How your surgery site will be marked. °What steps will be taken to help prevent infection. These may include: °Removing hair at the surgery site. °Washing skin with a germ-killing soap. °Taking antibiotic medicine. °What happens during the procedure? ° °An IV will be inserted into one of your veins. °You will be given one or both of the following: °A medicine to help you relax (sedative). °A medicine to make you fall asleep (general anesthetic). °Your surgeon will make several small incisions in your abdomen. °The laparoscope will be inserted through one of the small incisions. The camera on the laparoscope will send images to a monitor in the operating room. This lets your surgeon see inside your abdomen. °A gas will be pumped into your abdomen. This will expand your abdomen to give the surgeon more room to perform the surgery. °Other tools that   are needed for the procedure will be inserted through the other incisions. The gallbladder will be removed through one of the  incisions. Your common bile duct may be examined. If stones are found in the common bile duct, they may be removed. After your gallbladder has been removed, the incisions will be closed with stitches (sutures), staples, or skin glue. Your incisions will be covered with a bandage (dressing). The procedure may vary among health care providers and hospitals. What happens after the procedure? Your blood pressure, heart rate, breathing rate, and blood oxygen level will be monitored until you leave the hospital or clinic. You will be given medicines as needed to control your pain. You may have a drain placed in the incision. The drain will be removed a day or two after the procedure. Summary Minimally invasive cholecystectomy, also called laparoscopic cholecystectomy, is surgery to remove the gallbladder using small incisions. Tell your health care provider about all the medical conditions you have and all the medicines you are taking for those conditions. Before the procedure, follow instructions about when to stop eating and drinking and changing or stopping medicines. Plan to have a responsible adult care for you for the time you are told after you leave the hospital or clinic. This information is not intended to replace advice given to you by your health care provider. Make sure you discuss any questions you have with your health care provider.  Cholelithiasis Cholelithiasis is a disease in which gallstones form in the gallbladder. The gallbladder is an organ that stores bile. Bile is a fluid that helps to digest fats. Gallstones begin as small crystals and can slowly grow into stones. They may cause no symptoms until they block the gallbladder duct, or cystic duct, when the gallbladder tightens (contracts) after food is eaten. This can cause pain and is known as a gallbladder attack, or biliary colic. There are two main types of gallstones: Cholesterol stones. These are the most common type of  gallstone. These stones are made of hardened cholesterol and are usually yellow-green in color. Cholesterol is a fat-like substance that is made in the liver. Pigment stones. These are dark in color and are made of a red-yellow substance, called bilirubin,that forms when hemoglobin from red blood cells breaks down. What are the causes? This condition may be caused by an imbalance in the different parts that make bile. This can happen if the bile: Has too much bilirubin. This can happen in certain blood diseases, such as sickle cell anemia. Has too much cholesterol. Does not have enough bile salts. These salts help the body absorb and digest fats. In some cases, this condition can also be caused by the gallbladder not emptying completely or often enough. This is common during pregnancy. What increases the risk? The following factors may make you more likely to develop this condition: Being female. Having multiple pregnancies. Health care providers sometimes advise removing diseased gallbladders before future pregnancies. Eating a diet that is heavy in fried foods, fat, and refined carbohydrates, such as white bread and white rice. Being obese. Being older than age 58. Using medicines that contain female hormones (estrogen) for a long time. Losing weight quickly. Having a family history of gallstones. Having certain medical problems, such as: Diabetes mellitus. Cystic fibrosis. Crohn's disease. Cirrhosis or other long-term (chronic) liver disease. Certain blood diseases, such as sickle cell anemia or leukemia. What are the signs or symptoms? In many cases, having gallstones causes no symptoms. When you have gallstones but  do not have symptoms, you have silent gallstones. If a gallstone blocks your bile duct, it can cause a gallbladder attack. The main symptom of a gallbladder attack is sudden pain in the upper right part of the abdomen. The pain: Usually comes at night or after eating. Can  last for one hour or more. Can spread to your right shoulder, back, or chest. Can feel like indigestion. This is discomfort, burning, or fullness in your upper abdomen. If the bile duct is blocked for more than a few hours, it can cause an infection or inflammation of your gallbladder (cholecystitis), liver, or pancreas. This can cause: Nausea or vomiting. Bloating. Pain in your abdomen that lasts for 5 hours or longer. Tenderness in your upper abdomen, often in the upper right section and under your rib cage. Fever or chills. Skin or the white parts of your eyes turning yellow (jaundice). This usually happens when a stone has blocked bile from passing through the common bile duct. Dark urine or light-colored stools. How is this diagnosed? This condition may be diagnosed based on: A physical exam. Your medical history. Ultrasound. CT scan. MRI. You may also have other tests, including: Blood tests to check for signs of an infection or inflammation. Cholescintigraphy, or HIDA scan. This is a scan of your gallbladder and bile ducts (biliary system) using non-harmful radioactive material and special cameras that can see the radioactive material. Endoscopic retrograde cholangiopancreatogram. This involves inserting a small tube with a camera on the end (endoscope) through your mouth to look at bile ducts and check for blockages. How is this treated? Treatment for this condition depends on the severity of the condition. Silent gallstones do not need treatment. Treatment may be needed if a blockage causes a gallbladder attack or other symptoms. Treatment may include: Home care, if symptoms are not severe. During a simple gallbladder attack, stop eating and drinking for 12-24 hours (except for water and clear liquids). This helps to "cool down" your gallbladder. After 1 or 2 days, you can start to eat a diet of simple or clear foods, such as broths and crackers. You may also need medicines for  pain or nausea or both. If you have cholecystitis and an infection, you will need antibiotics. A hospital stay, if needed for pain control or for cholecystitis with severe infection. Cholecystectomy, or surgery to remove your gallbladder. This is the most common treatment if all other treatments have not worked. Medicines to break up gallstones. These are most effective at treating small gallstones. Medicines may be used for up to 6-12 months. Endoscopic retrograde cholangiopancreatogram. A small basket can be attached to the endoscope and used to capture and remove gallstones, mainly those that are in the common bile duct. Follow these instructions at home: Medicines Take over-the-counter and prescription medicines only as told by your health care provider. If you were prescribed an antibiotic medicine, take it as told by your health care provider. Do not stop taking the antibiotic even if you start to feel better. Ask your health care provider if the medicine prescribed to you requires you to avoid driving or using machinery. Eating and drinking Drink enough fluid to keep your urine pale yellow. This is important during a gallbladder attack. Water and clear liquids are preferred. Follow a healthy diet. This includes: Reducing fatty foods, such as fried food and foods high in cholesterol. Reducing refined carbohydrates, such as white bread and white rice. Eating more fiber. Aim for foods such as almonds, fruit, and  beans. Alcohol use If you drink alcohol: Limit how much you use to: 0-1 drink a day for nonpregnant women. 0-2 drinks a day for men. Be aware of how much alcohol is in your drink. In the [place] one drink equals one 12 oz bottle of beer (355 mL), one 5 oz glass of wine (148 mL), or one 1 oz glass of hard liquor (44 mL). General instructions Do not use any products that contain nicotine or tobacco, such as cigarettes, e-cigarettes, and chewing tobacco. If you need help quitting, ask  your health care provider. Maintain a healthy weight. Keep all follow-up visits as told by your health care provider. These may include consultations with a surgeon or specialist. This is important. Where to find more information [employer] [facility] [employer] [facility] [employer] [employer]: [email or web address] Contact a health care provider if: You think you have had a gallbladder attack. You have been diagnosed with silent gallstones and you develop pain in your abdomen or indigestion. You begin to have attacks more often. You have dark urine or light-colored stools. Get help right away if: You have pain from a gallbladder attack that lasts for more than 2 hours. You have pain in your abdomen that lasts for more than 5 hours or is getting worse. You have a fever or chills. You have nausea and vomiting that do not go away. You develop jaundice. Summary Cholelithiasis is a disease in which gallstones form in the gallbladder. This condition may be caused by an imbalance in the different parts that make bile. This can happen if your bile has too much bilirubin or cholesterol, or does not have enough bile salts. Treatment for gallstones depends on the severity of the condition. Silent gallstones do not need treatment. If gallstones cause a gallbladder attack or other symptoms, treatment usually involves not eating or drinking anything. Treatment may also include pain medicines and antibiotics, and it sometimes includes a hospital stay. Surgery to remove the gallbladder is common if all other treatments have not worked. This information is not intended to replace advice given to you by your health care provider. Make sure you discuss any questions you have with your health care provider. Document Revised: 07/27/2019 Document Reviewed: 07/27/2019 [employer] Patient Education  2022 [employer]  Document Revised: 03/07/2021 Document Reviewed: 03/07/2021 [employer] Patient Education  2022  [employer].

## 2021-09-01 NOTE — H&P (Signed)
[facility] History and Physical  Reason for Referral: Gallstones  Referring Physician: Renata Caprice, DO   Chief Complaint   New Patient (Initial Visit)     Gloria Chapman is a 65 y.o. female.  HPI:  Gloria Chapman a 65 yo who comes in from her assisted living with severe RUQ pain that she says has been worsening in the last few weeks. She has multiple medical issues including fibromyalgia on chronic pain meds, COPD and issue with her R hip after repair and MRSA infection. She is wheelchair bound and can pivot but does not walk. She has been followed by GI for some time for gastroparesis, unable to tolerate reglan it sounds like, GERD and Hep C that was treated. She had EGD in the past with candidiasis and esophagitis, and gastric emptying studying with gastroparesis.   She says that she has been having worsening pain and tenderness in the RUQ. She has some nausea and says the pain is sharp and stabbing at times. She use to smoke more but now only smoke a few cigarettes a day.   Past Medical History:  Diagnosis Date   Anemia    Anxiety    Arthritis    COPD (chronic obstructive pulmonary disease) (HCC)    Depression    Diabetes mellitus without complication (HCC)    Dyspnea    Fibromyalgia    GERD (gastroesophageal reflux disease)    Headache    Heart failure (HCC)    Heart murmur    Hepatitis    History of Clostridium difficile infection    History of methicillin resistant staphylococcus aureus (MRSA)    Insomnia    Overactive bladder    Pneumonia     Past Surgical History:  Procedure Laterality Date   APPENDECTOMY     BACK SURGERY     twice   DIAGNOSTIC LAPAROSCOPY     checking for endometriosis   ESOPHAGOGASTRODUODENOSCOPY (EGD) WITH PROPOFOL N/A 11/21/2017   Dr. Gala Romney: Erosive reflux esophagitis, low-grade narrowing Schatzki ring status post dilation, small hiatal hernia.   ESOPHAGOGASTRODUODENOSCOPY (EGD) WITH PROPOFOL N/A 07/25/2020   Dr. Gala Romney: Oral  candidiasis, likely early Candida esophagitis, normal stomach and duodenum.  Esophagus dilated due to history of dysphagia.   FEMUR FRACTURE SURGERY Right    Rods placed   HEMORRHOID SURGERY     INNER EAR SURGERY Left    MALONEY DILATION N/A 11/21/2017   Procedure: Venia Minks DILATION;  Surgeon: Daneil Dolin, MD;  [facility]: [facility];  Service: Endoscopy;  Laterality: N/A;   MALONEY DILATION N/A 07/25/2020   Procedure: Venia Minks DILATION;  Surgeon: Daneil Dolin, MD;  Location: [facility];  Service: Endoscopy;  Laterality: N/A;   NASAL SINUS SURGERY     right hip surgery     [place]: "took my hip out due to MRSA" occurred after hip replacements    Family History  Problem Relation Age of Onset   Cancer Father        unknown   Cancer Mother    Colon cancer Neg Hx     Social History   Tobacco Use   Smoking status: Every Day    Packs/day: 2.00    Years: 50.00    Pack years: 100.00    Types: Cigarettes   Smokeless tobacco: Never   Tobacco comments:    0.5PPD- 11/17/2020  Vaping Use   Vaping Use: Never used  Substance Use Topics   Alcohol use: No   Drug  use: No    Medications: Chapman have reviewed the patient's current medications. Allergies as of 08/29/2021       Reactions   Reglan [metoclopramide]    Tardive dyskinesia per patient        Medication List        Accurate as of August 29, 2021 11:13 AM. If you have any questions, ask your nurse or doctor.          acetaminophen 500 MG tablet Commonly known as: TYLENOL Take 1,000 mg by mouth every 8 (eight) hours as needed.   albuterol 108 (90 Base) MCG/ACT inhaler Commonly known as: VENTOLIN HFA Inhale 2 puffs into the lungs every 4 (four) hours as needed for wheezing or shortness of breath.   aspirin EC 81 MG tablet Take 81 mg by mouth daily. Swallow whole.   benzonatate 100 MG capsule Commonly known as: TESSALON Take 100 mg by mouth every 6 (six) hours as needed for cough.   bisacodyl 5 MG EC  tablet Commonly known as: DULCOLAX Take 15 mg by mouth daily as needed for moderate constipation.   Cranberry 125 MG Tabs Take 125 mg by mouth every 12 (twelve) hours.   cyclobenzaprine 10 MG tablet Commonly known as: FLEXERIL Take 1 tablet by mouth every 8 (eight) hours as needed.   dextromethorphan-guaiFENesin 10-100 MG/5ML liquid Commonly known as: ROBITUSSIN-DM Take 20 mLs by mouth every 4 (four) hours as needed for cough.   diclofenac Sodium 1 % Gel Commonly known as: VOLTAREN Apply 2 g topically as needed.   diphenhydrAMINE 25 MG tablet Commonly known as: BENADRYL Take 25 mg by mouth every 6 (six) hours as needed for itching.   DULoxetine 60 MG capsule Commonly known as: CYMBALTA Take 60 mg by mouth daily.   furosemide 40 MG tablet Commonly known as: LASIX Take 40 mg by mouth daily.   gabapentin 300 MG capsule Commonly known as: NEURONTIN Take 2 capsules (600 mg total) by mouth 3 (three) times daily. What changed:  how much to take when to take this   Glucagon HCl 1 MG Solr Inject 1 mg as directed daily as needed (low blood sugar).   insulin glargine 100 UNIT/ML injection Commonly known as: LANTUS Inject 43 Units into the skin every evening.   LORazepam 1 MG tablet Commonly known as: ATIVAN Take 1 mg by mouth every 8 (eight) hours as needed.   melatonin 5 MG Tabs Take 10 mg by mouth at bedtime.   naloxone 4 MG/0.1ML Liqd nasal spray kit Commonly known as: NARCAN Place 1 spray into the nose as needed.   oxybutynin 10 MG 24 hr tablet Commonly known as: DITROPAN-XL Take 10 mg by mouth daily.   oxyCODONE-acetaminophen 10-325 MG tablet Commonly known as: PERCOCET Take 1 tablet by mouth every 8 (eight) hours as needed for pain.   pantoprazole 40 MG tablet Commonly known as: PROTONIX Take 1 tablet (40 mg total) by mouth 2 (two) times daily before a meal.   polyethylene glycol 17 g packet Commonly known as: MIRALAX / GLYCOLAX Take 17 g by mouth 2  (two) times daily.   potassium chloride 10 MEQ tablet Commonly known as: KLOR-CON M Take 10 mEq by mouth daily.   predniSONE 50 MG tablet Commonly known as: DELTASONE 1 tablet PO QD X4 days   promethazine 12.5 MG tablet Commonly known as: PHENERGAN Take 1 tablet (12.5 mg total) by mouth every 8 (eight) hours as needed for nausea or vomiting. No more than twice  dialy. What changed:  when to take this additional instructions   Saline Spray 0.2 % Soln Place 1 spray into the nose every 4 (four) hours as needed (nasal dryness/irritation).   senna 8.6 MG Tabs tablet Commonly known as: SENOKOT Take 1 tablet by mouth every 12 (twelve) hours.   sitaGLIPtin 100 MG tablet Commonly known as: JANUVIA Take 100 mg by mouth daily.   Stiolto Respimat 2.5-2.5 MCG/ACT Aers Generic drug: Tiotropium Bromide-Olodaterol Inhale 2 puffs into the lungs daily.   Systane 0.4-0.3 % Soln Generic drug: Polyethyl Glycol-Propyl Glycol Place 1 drop into both eyes every 12 (twelve) hours as needed (dry eyes).   traZODone 100 MG tablet Commonly known as: DESYREL Take 200 mg by mouth at bedtime.   Vitamin D 50 MCG (2000 UT) tablet Take 2,000 Units by mouth daily.   witch hazel-glycerin pad Commonly known as: TUCKS Apply 1 application topically as needed for itching or irritation.         ROS:  A comprehensive review of systems was negative except for: Ears, nose, mouth, throat, and face: positive for sinus issues Respiratory: positive for wheezing and COPD Gastrointestinal: positive for abdominal pain and nausea Musculoskeletal: positive for back pain, neck pain, and joint pain  Blood pressure 134/81, pulse 76, temperature 98.6 F (37 C), temperature source Other (Comment), resp. rate 18, height _0  (1.575 m), weight 222 lb (100.7 kg), SpO2 94 %. Physical Exam Vitals reviewed.  Constitutional:      Appearance: Normal appearance.  HENT:     Head: Normocephalic.  Eyes:     Extraocular  Movements: Extraocular movements intact.  Cardiovascular:     Rate and Rhythm: Normal rate and regular rhythm.  Pulmonary:     Effort: Pulmonary effort is normal.     Breath sounds: Normal breath sounds.  Abdominal:     General: There is no distension.     Palpations: Abdomen is soft.     Tenderness: There is abdominal tenderness in the right upper quadrant and epigastric area.  Musculoskeletal:        General: Tenderness present.     Cervical back: Normal range of motion.     Comments: In wheelchair  Skin:    General: Skin is warm.  Neurological:     General: No focal deficit present.     Mental Status: She is alert and oriented to person, place, and time.  Psychiatric:        Mood and Affect: Mood normal.        Behavior: Behavior normal.    Results: CLINICAL DATA:  Abdominal pain, nausea and reflux for more than 1 year, took Zofran the morning of this exam   EXAM: NUCLEAR MEDICINE GASTRIC EMPTYING SCAN   TECHNIQUE: After oral ingestion of radiolabeled meal, sequential abdominal images were obtained for 4 hours. Percentage of activity emptying the stomach was calculated at 1 hour, 2 hour, 3 hour, and 4 hours.   RADIOPHARMACEUTICALS:  2.1 mCi Tc-61msulfur colloid in standardized meal   COMPARISON:  None   FINDINGS: Expected location of the stomach in the left upper quadrant.   Ingested meal empties the stomach slowly and incompletely over the course of the study.   22% emptied at 1 hr ( normal >= 10%)   27% emptied at 2 hr ( normal >= 40%)   32% emptied at 3 hr ( normal >= 70%)   55% emptied at 4 hr ( normal >= 90%)   IMPRESSION: Abnormal delayed gastric  emptying.     Electronically Signed   By: Lavonia Dana M.D.   On: 06/25/2019 14:05  CLINICAL DATA:  Epigastric pain, history of hepatitis C, questionable early cirrhosis on prior CT.   EXAM: ABDOMEN ULTRASOUND COMPLETE   COMPARISON:  CT December 10, 2019   FINDINGS: Gallbladder: Biliary sludge. No  gallstones or wall thickening visualized. No sonographic Murphy sign noted by sonographer.   Common bile duct: Diameter: 6 mm.   Liver: No focal lesion identified. Diffusely increased parenchymal echogenicity. No discrete contour nodularity. Portal vein is patent on color Doppler imaging with normal direction of blood flow towards the liver.   IVC: No abnormality visualized.   Pancreas: Visualized portion unremarkable.   Spleen: Size and appearance within normal limits.   Right Kidney: Length: 9.5 cm. Echogenicity within normal limits. No mass or hydronephrosis visualized.   Left Kidney: Length: 10.8 cm. Echogenicity within normal limits. No mass or hydronephrosis visualized.   Abdominal aorta: No aneurysm visualized.   Other findings: None.   IMPRESSION: 1. Increased liver echogenicity, no discrete contour nodularity. This is a nonspecific finding but is most commonly seen with fatty infiltration of the liver or hepatocellular disease. No overt hepatic lesion visualized. 2. Biliary sludge. No cholelithiasis or findings of acute cholecystitis.     Electronically Signed   By: Dahlia Bailiff M.D.   On: 07/04/2021 21:55   CLINICAL DATA:  Epigastric pain, history of hepatitis C, questionable early cirrhosis on prior CT.   EXAM: ABDOMEN ULTRASOUND COMPLETE   COMPARISON:  CT December 10, 2019   FINDINGS: Gallbladder: Biliary sludge. No gallstones or wall thickening visualized. No sonographic Murphy sign noted by sonographer.   Common bile duct: Diameter: 6 mm.   Liver: No focal lesion identified. Diffusely increased parenchymal echogenicity. No discrete contour nodularity. Portal vein is patent on color Doppler imaging with normal direction of blood flow towards the liver.   IVC: No abnormality visualized.   Pancreas: Visualized portion unremarkable.   Spleen: Size and appearance within normal limits.   Right Kidney: Length: 9.5 cm. Echogenicity within normal  limits. No mass or hydronephrosis visualized.   Left Kidney: Length: 10.8 cm. Echogenicity within normal limits. No mass or hydronephrosis visualized.   Abdominal aorta: No aneurysm visualized.   Other findings: None.   IMPRESSION: 1. Increased liver echogenicity, no discrete contour nodularity. This is a nonspecific finding but is most commonly seen with fatty infiltration of the liver or hepatocellular disease. No overt hepatic lesion visualized. 2. Biliary sludge. No cholelithiasis or findings of acute cholecystitis.     Electronically Signed   By: Dahlia Bailiff M.D.   On: 07/04/2021 21:55  CLINICAL DATA:  Epigastric pain with intermittent vomiting for 2 months. Chronic hepatitis-C.   EXAM: CT ABDOMEN AND PELVIS WITH CONTRAST   TECHNIQUE: Multidetector CT imaging of the abdomen and pelvis was performed using the standard protocol following bolus administration of intravenous contrast.   CONTRAST:  176m OMNIPAQUE IOHEXOL 300 MG/ML  SOLN   COMPARISON:  CT pelvis from 05/15/2015 and CT abdomen [facility]   FINDINGS: Lower chest: Bibasilar atelectasis versus scarring. Stable mild distal esophageal wall thickening, esophagitis would be a common cause. Mild descending thoracic aortic atherosclerotic calcification.   Hepatobiliary: No significant abnormal arterial phase enhancement in the liver to suggest hepatocellular carcinoma. There is some mild prominence of the caudate lobe which can be a morphologic indicator of early cirrhosis, but no overt nodularity of the liver is  observed. The gallbladder appears unremarkable. The common hepatic duct is about 0.8 cm in diameter with the common bile duct about 0.6 cm in diameter, borderline dilated.   Pancreas: Unremarkable   Spleen: Unremarkable   Adrenals/Urinary Tract: 0.5 cm hypodense lesion of the left mid kidney posteriorly on image 53/3 is likely cysts but technically  too small to characterize. The adrenal glands appear normal. Urinary bladder unremarkable.   Stomach/Bowel: Unremarkable   Vascular/Lymphatic: Mild abdominal aortic atherosclerotic calcification near the bifurcation, with atherosclerosis of the iliac arteries. No pathologic adenopathy identified.   Reproductive: Unremarkable   Other: No supplemental non-categorized findings.   Musculoskeletal: Interval removal of the right hip prosthesis, with cephalad migration of the right femur. Absent right femoral neck and head. Irregular and flattened right acetabular region. Mildly accentuated but otherwise nonspecific density along the left pectineus muscle. Posterolateral rod and pedicle screw fixation at L4-L5-S1 bilaterally. Chronic appearing compression fractures at L1 and L3.   IMPRESSION: 1. A specific cause for the patient's epigastric pain and vomiting is not identified. There is some mild prominence of the caudate lobe of the liver which can be a morphologic indicator of early cirrhosis, but no overt nodularity of the liver is observed. 2. Stable mild distal esophageal wall thickening, esophagitis would be a common cause. 3. Interval removal of the right hip prosthesis, with cephalad migration of the right femur. Absent right femoral neck and head. Irregular and flattened right acetabular region. Mildly accentuated but otherwise nonspecific density along the left pectineus muscle. 4. Chronic compression fractures at L1 and L3.   Aortic Atherosclerosis (ICD10-I70.0).     Electronically Signed   By: Van Clines M.D.   On: 12/11/2019 09:11  Assessment & Plan:  ANNLEE GLANDON is a 65 y.o. female with gastroparesis, prior esophagitis and some chronic pain on percocet 10 she reports. She does have RUQ pain that is worsening and nausea. She has stones on her [place]. Discussed with her that all her pain may not be from the gallstones. Discussed removal of the gallbladder to see  if that will help but no guarantees of pain relief given other issues.  PLAN: Chapman counseled the patient about the indication, risks and benefits of laparoscopic cholecystectomy.  She understands there is a very small chance for bleeding, infection, injury to normal structures (including common bile duct), conversion to open surgery, persistent symptoms, evolution of postcholecystectomy diarrhea, need for secondary interventions, anesthesia reaction, cardiopulmonary issues and other risks not specifically detailed here. Chapman described the expected recovery, the plan for follow-up and the restrictions during the recovery phase.  All questions were answered.   All questions were answered to the satisfaction of the patient.  Future Appointments  Date Time Provider [place]  09/15/2021  2:15 PM [facility] PAT 2 [facility] None  01/08/2022 10:00 AM Mahala Menghini, PA-C [facility]      Virl Cagey 08/29/2021, 11:13 AM

## 2021-09-13 NOTE — Patient Instructions (Signed)
Gloria Chapman  09/13/2021     @PREFPERIOPPHARMACY @   Your procedure is scheduled on  09/20/2021.   Report to 11/18/2021 at  [phone]  A.M.   Call this number if you have problems the morning of surgery:  [phone]   Remember:  Do not eat or drink after midnight.     Take 21.5 units of lantus the night before your procedure.    Use your inhaler before you come and bring your rescue inhaler with you.    Take these medicines the morning of surgery with A SIP OF WATER        flexeril (if needed), cymbalta, gabapentin, ativan (if needed), ditropan, oxycodone(if needed), protonix.     Do not wear jewelry, make-up or nail polish.  Do not wear lotions, powders, or perfumes, or deodorant.  Do not shave 48 hours prior to surgery.  Men may shave face and neck.  Do not bring valuables to the hospital.  [facility] is not responsible for any belongings or valuables.  Contacts, dentures or bridgework may not be worn into surgery.  Leave your suitcase in the car.  After surgery it may be brought to your room.  For patients admitted to the hospital, discharge time will be determined by your treatment team.  Patients discharged the day of surgery will not be allowed to drive home and must have someone with them for 24 hours.    Special instructions:   DO NOT smoke tobacco or vape for 24 hours before your procedure.  Please read over the following fact sheets that you were given. Coughing and Deep Breathing, Surgical Site Infection Prevention, Anesthesia Post-op Instructions, and Care and Recovery After Surgery      Minimally Invasive Cholecystectomy, Care After The following information offers guidance on how to care for yourself after your procedure. Your health care provider may also give you more specific instructions. If you have problems or questions, contact your health care provider. What can I expect after the procedure? After the procedure, it is common to  have: Pain at your incision sites. You will be given medicines to control this pain. Mild nausea or vomiting. Bloating and possible shoulder pain from the gas that was used during the procedure. Follow these instructions at home: Medicines Take over-the-counter and prescription medicines only as told by your health care provider. If you were prescribed an antibiotic medicine, take it as told by your health care provider. Do not stop using the antibiotic even if you start to feel better. Ask your health care provider if the medicine prescribed to you: Requires you to avoid driving or using machinery. Can cause constipation. You may need to take these actions to prevent or treat constipation: Drink enough fluid to keep your urine pale yellow. Take over-the-counter or prescription medicines. Eat foods that are high in fiber, such as beans, whole grains, and fresh fruits and vegetables. Limit foods that are high in fat and processed sugars, such as fried or sweet foods. Incision care  Follow instructions from your health care provider about how to take care of your incisions. Make sure you: Wash your hands with soap and water for at least 20 seconds before and after you change your bandage (dressing). If soap and water are not available, use hand sanitizer. Change your dressing as told by your health care provider. Leave stitches (sutures), skin glue, or adhesive strips in place. These skin closures may need to  be in place for 2 weeks or longer. If adhesive strip edges start to loosen and curl up, you may trim the loose edges. Do not remove adhesive strips completely unless your health care provider tells you to do that. Do not take baths, swim, or use a hot tub until your health care provider approves. Ask your health care provider if you may take showers. You may only be allowed to take sponge baths. Check your incision area every day for signs of infection. Check for: More redness, swelling, or  pain. Fluid or blood. Warmth. Pus or a bad smell. Activity Rest as told by your health care provider. Do not do activities that require a lot of effort. Avoid sitting for a long time without moving. Get up to take short walks every 1-2 hours. This is important to improve blood flow and breathing. Ask for help if you feel weak or unsteady. Do not lift anything that is heavier than 10 lb (4.5 kg), or the limit that you are told, until your health care provider says that it is safe. Do not play contact sports until your health care provider approves. Do not return to work or school until your health care provider approves. Return to your normal activities as told by your health care provider. Ask your health care provider what activities are safe for you. General instructions If you were given a sedative during the procedure, it can affect you for several hours. Do not drive or operate machinery until your health care provider says that it is safe. Keep all follow-up visits. This is important. Contact a health care provider if: You develop a rash. You have more redness, swelling, or pain around your incisions. You have fluid or blood coming from your incisions. Your incisions feel warm to the touch. You have pus or a bad smell coming from your incisions. You have a fever. One or more of your incisions breaks open. Get help right away if: You have trouble breathing. You have chest pain. You have more pain in your shoulders. You faint or feel dizzy when you stand. You have severe pain in your abdomen. You have nausea or vomiting that lasts for more than one day. You have leg pain that is new or unusual, or if it is localized to one specific spot. These symptoms may represent a serious problem that is an emergency. Do not wait to see if the symptoms will go away. Get medical help right away. Call your local emergency services (911 in the [place] Do not drive yourself to the  hospital. Summary After your procedure, it is common to have pain at the incision sites. You may also have nausea or bloating. Follow your health care provider's instructions about medicine, activity restrictions, and caring for your incision areas. Do not do activities that require a lot of effort. Contact a health care provider if you have a fever or other signs of infection, such as more redness, swelling, or pain around the incisions. Get help right away if you have chest pain, increasing pain in the shoulders, or trouble breathing. This information is not intended to replace advice given to you by your health care provider. Make sure you discuss any questions you have with your health care provider. Document Revised: 03/07/2021 Document Reviewed: 03/07/2021 Elsevier Patient Education  2022 [employer] General Anesthesia, Adult, Care After This sheet gives you information about how to care for yourself after your procedure. Your health care provider may also give you more  specific instructions. If you have problems or questions, contact your health care provider. What can I expect after the procedure? After the procedure, the following side effects are common: Pain or discomfort at the IV site. Nausea. Vomiting. Sore throat. Trouble concentrating. Feeling cold or chills. Feeling weak or tired. Sleepiness and fatigue. Soreness and body aches. These side effects can affect parts of the body that were not involved in surgery. Follow these instructions at home: For the time period you were told by your health care provider:  Rest. Do not participate in activities where you could fall or become injured. Do not drive or use machinery. Do not drink alcohol. Do not take sleeping pills or medicines that cause drowsiness. Do not make important decisions or sign legal documents. Do not take care of children on your own. Eating and drinking Follow any instructions from your health care  provider about eating or drinking restrictions. When you feel hungry, start by eating small amounts of foods that are soft and easy to digest (bland), such as toast. Gradually return to your regular diet. Drink enough fluid to keep your urine pale yellow. If you vomit, rehydrate by drinking water, juice, or clear broth. General instructions If you have sleep apnea, surgery and certain medicines can increase your risk for breathing problems. Follow instructions from your health care provider about wearing your sleep device: Anytime you are sleeping, including during daytime naps. While taking prescription pain medicines, sleeping medicines, or medicines that make you drowsy. Have a responsible adult stay with you for the time you are told. It is important to have someone help care for you until you are awake and alert. Return to your normal activities as told by your health care provider. Ask your health care provider what activities are safe for you. Take over-the-counter and prescription medicines only as told by your health care provider. If you smoke, do not smoke without supervision. Keep all follow-up visits as told by your health care provider. This is important. Contact a health care provider if: You have nausea or vomiting that does not get better with medicine. You cannot eat or drink without vomiting. You have pain that does not get better with medicine. You are unable to pass urine. You develop a skin rash. You have a fever. You have redness around your IV site that gets worse. Get help right away if: You have difficulty breathing. You have chest pain. You have blood in your urine or stool, or you vomit blood. Summary After the procedure, it is common to have a sore throat or nausea. It is also common to feel tired. Have a responsible adult stay with you for the time you are told. It is important to have someone help care for you until you are awake and alert. When you feel  hungry, start by eating small amounts of foods that are soft and easy to digest (bland), such as toast. Gradually return to your regular diet. Drink enough fluid to keep your urine pale yellow. Return to your normal activities as told by your health care provider. Ask your health care provider what activities are safe for you. This information is not intended to replace advice given to you by your health care provider. Make sure you discuss any questions you have with your health care provider. Document Revised: 05/19/2020 Document Reviewed: 12/17/2019 Elsevier Patient Education  2022 Elsevier Inc. How to Use Chlorhexidine for Bathing Chlorhexidine gluconate (CHG) is a germ-killing (antiseptic) solution that is used to  clean the skin. It can get rid of the bacteria that normally live on the skin and can keep them away for about 24 hours. To clean your skin with CHG, you may be given: A CHG solution to use in the shower or as part of a sponge bath. A prepackaged cloth that contains CHG. Cleaning your skin with CHG may help lower the risk for infection: While you are staying in the intensive care unit of the hospital. If you have a vascular access, such as a central line, to provide short-term or long-term access to your veins. If you have a catheter to drain urine from your bladder. If you are on a ventilator. A ventilator is a machine that helps you breathe by moving air in and out of your lungs. After surgery. What are the risks? Risks of using CHG include: A skin reaction. Hearing loss, if CHG gets in your ears and you have a perforated eardrum. Eye injury, if CHG gets in your eyes and is not rinsed out. The CHG product catching fire. Make sure that you avoid smoking and flames after applying CHG to your skin. Do not use CHG: If you have a chlorhexidine allergy or have previously reacted to chlorhexidine. On babies younger than 51 months of age. How to use CHG solution Use CHG only as  told by your health care provider, and follow the instructions on the label. Use the full amount of CHG as directed. Usually, this is one bottle. During a shower Follow these steps when using CHG solution during a shower (unless your health care provider gives you different instructions): Start the shower. Use your normal soap and shampoo to wash your face and hair. Turn off the shower or move out of the shower stream. Pour the CHG onto a clean washcloth. Do not use any type of brush or rough-edged sponge. Starting at your neck, lather your body down to your toes. Make sure you follow these instructions: If you will be having surgery, pay special attention to the part of your body where you will be having surgery. Scrub this area for at least 1 minute. Do not use CHG on your head or face. If the solution gets into your ears or eyes, rinse them well with water. Avoid your genital area. Avoid any areas of skin that have broken skin, cuts, or scrapes. Scrub your back and under your arms. Make sure to wash skin folds. Let the lather sit on your skin for 1-2 minutes or as long as told by your health care provider. Thoroughly rinse your entire body in the shower. Make sure that all body creases and crevices are rinsed well. Dry off with a clean towel. Do not put any substances on your body afterward--such as powder, lotion, or perfume--unless you are told to do so by your health care provider. Only use lotions that are recommended by the manufacturer. Put on clean clothes or pajamas. If it is the night before your surgery, sleep in clean sheets.  During a sponge bath Follow these steps when using CHG solution during a sponge bath (unless your health care provider gives you different instructions): Use your normal soap and shampoo to wash your face and hair. Pour the CHG onto a clean washcloth. Starting at your neck, lather your body down to your toes. Make sure you follow these instructions: If  you will be having surgery, pay special attention to the part of your body where you will be having surgery. Scrub this  area for at least 1 minute. Do not use CHG on your head or face. If the solution gets into your ears or eyes, rinse them well with water. Avoid your genital area. Avoid any areas of skin that have broken skin, cuts, or scrapes. Scrub your back and under your arms. Make sure to wash skin folds. Let the lather sit on your skin for 1-2 minutes or as long as told by your health care provider. Using a different clean, wet washcloth, thoroughly rinse your entire body. Make sure that all body creases and crevices are rinsed well. Dry off with a clean towel. Do not put any substances on your body afterward--such as powder, lotion, or perfume--unless you are told to do so by your health care provider. Only use lotions that are recommended by the manufacturer. Put on clean clothes or pajamas. If it is the night before your surgery, sleep in clean sheets. How to use CHG prepackaged cloths Only use CHG cloths as told by your health care provider, and follow the instructions on the label. Use the CHG cloth on clean, dry skin. Do not use the CHG cloth on your head or face unless your health care provider tells you to. When washing with the CHG cloth: Avoid your genital area. Avoid any areas of skin that have broken skin, cuts, or scrapes. Before surgery Follow these steps when using a CHG cloth to clean before surgery (unless your health care provider gives you different instructions): Using the CHG cloth, vigorously scrub the part of your body where you will be having surgery. Scrub using a back-and-forth motion for 3 minutes. The area on your body should be completely wet with CHG when you are done scrubbing. Do not rinse. Discard the cloth and let the area air-dry. Do not put any substances on the area afterward, such as powder, lotion, or perfume. Put on clean clothes or pajamas. If it  is the night before your surgery, sleep in clean sheets.  For general bathing Follow these steps when using CHG cloths for general bathing (unless your health care provider gives you different instructions). Use a separate CHG cloth for each area of your body. Make sure you wash between any folds of skin and between your fingers and toes. Wash your body in the following order, switching to a new cloth after each step: The front of your neck, shoulders, and chest. Both of your arms, under your arms, and your hands. Your stomach and groin area, avoiding the genitals. Your right leg and foot. Your left leg and foot. The back of your neck, your back, and your buttocks. Do not rinse. Discard the cloth and let the area air-dry. Do not put any substances on your body afterward--such as powder, lotion, or perfume--unless you are told to do so by your health care provider. Only use lotions that are recommended by the manufacturer. Put on clean clothes or pajamas. Contact a health care provider if: Your skin gets irritated after scrubbing. You have questions about using your solution or cloth. You swallow any chlorhexidine. Call your local poison control center [phone] 572-1296 in the [place] Get help right away if: Your eyes itch badly, or they become very red or swollen. Your skin itches badly and is red or swollen. Your hearing changes. You have trouble seeing. You have swelling or tingling in your mouth or throat. You have trouble breathing. These symptoms may represent a serious problem that is an emergency. Do not wait to see if the  symptoms will go away. Get medical help right away. Call your local emergency services (911 in the [place] Do not drive yourself to the hospital. Summary Chlorhexidine gluconate (CHG) is a germ-killing (antiseptic) solution that is used to clean the skin. Cleaning your skin with CHG may help to lower your risk for infection. You may be given CHG to use for bathing. It  may be in a bottle or in a prepackaged cloth to use on your skin. Carefully follow your health care provider's instructions and the instructions on the product label. Do not use CHG if you have a chlorhexidine allergy. Contact your health care provider if your skin gets irritated after scrubbing. This information is not intended to replace advice given to you by your health care provider. Make sure you discuss any questions you have with your health care provider. Document Revised: 11/14/2020 Document Reviewed: 11/14/2020 Elsevier Patient Education  2022 [employer].

## 2021-09-15 ENCOUNTER — Other Ambulatory Visit: Payer: Self-pay

## 2021-09-15 ENCOUNTER — Encounter (HOSPITAL_COMMUNITY): Payer: Self-pay

## 2021-09-15 ENCOUNTER — Encounter (HOSPITAL_COMMUNITY)
Admission: RE | Admit: 2021-09-15 | Discharge: 2021-09-15 | Disposition: A | Discharge status: Home / Self Care | Payer: Medicare Other | Source: Ambulatory Visit | Attending: General Surgery | Admitting: General Surgery

## 2021-09-15 VITALS — BP 136/78 | HR 84 | Temp 98.6°F | Resp 18 | Ht 62.0 in | Wt 222.0 lb

## 2021-09-15 DIAGNOSIS — B182 Chronic viral hepatitis C: Secondary | ICD-10-CM | POA: Diagnosis not present

## 2021-09-15 DIAGNOSIS — Z01812 Encounter for preprocedural laboratory examination: Secondary | ICD-10-CM | POA: Insufficient documentation

## 2021-09-15 HISTORY — DX: Other specified postprocedural states: Z98.890

## 2021-09-15 HISTORY — DX: Other specified postprocedural states: R11.2

## 2021-09-15 LAB — CBC WITH DIFFERENTIAL/PLATELET
Abs Immature Granulocytes: 0.02 10*3/uL (ref 0.00–0.07)
Basophils Absolute: 0 10*3/uL (ref 0.0–0.1)
Basophils Relative: 0 %
Eosinophils Absolute: 0.1 10*3/uL (ref 0.0–0.5)
Eosinophils Relative: 2 %
HCT: 44.2 % (ref 36.0–46.0)
Hemoglobin: 14.6 g/dL (ref 12.0–15.0)
Immature Granulocytes: 0 %
Lymphocytes Relative: 49 %
Lymphs Abs: 3.2 10*3/uL (ref 0.7–4.0)
MCH: 29.1 pg (ref 26.0–34.0)
MCHC: 33 g/dL (ref 30.0–36.0)
MCV: 88 fL (ref 80.0–100.0)
Monocytes Absolute: 0.4 10*3/uL (ref 0.1–1.0)
Monocytes Relative: 6 %
Neutro Abs: 2.9 10*3/uL (ref 1.7–7.7)
Neutrophils Relative %: 43 %
Platelets: 157 10*3/uL (ref 150–400)
RBC: 5.02 MIL/uL (ref 3.87–5.11)
RDW: 14.1 % (ref 11.5–15.5)
WBC: 6.6 10*3/uL (ref 4.0–10.5)
nRBC: 0 % (ref 0.0–0.2)

## 2021-09-20 ENCOUNTER — Encounter (HOSPITAL_COMMUNITY)
Admission: RE | Disposition: A | Discharge status: Home / Self Care | Payer: Self-pay | Source: Home / Self Care | Attending: General Surgery

## 2021-09-20 ENCOUNTER — Encounter (HOSPITAL_COMMUNITY): Payer: Self-pay | Admitting: General Surgery

## 2021-09-20 ENCOUNTER — Ambulatory Visit (HOSPITAL_COMMUNITY)
Admission: RE | Admit: 2021-09-20 | Discharge: 2021-09-20 | Disposition: A | Discharge status: Home / Self Care | Payer: Medicare Other | Attending: General Surgery | Admitting: General Surgery

## 2021-09-20 ENCOUNTER — Ambulatory Visit (HOSPITAL_COMMUNITY): Payer: Medicare Other | Admitting: Certified Registered Nurse Anesthetist

## 2021-09-20 DIAGNOSIS — M797 Fibromyalgia: Secondary | ICD-10-CM | POA: Diagnosis not present

## 2021-09-20 DIAGNOSIS — Z8614 Personal history of Methicillin resistant Staphylococcus aureus infection: Secondary | ICD-10-CM | POA: Diagnosis not present

## 2021-09-20 DIAGNOSIS — Z6841 Body Mass Index (BMI) 40.0 and over, adult: Secondary | ICD-10-CM | POA: Diagnosis not present

## 2021-09-20 DIAGNOSIS — Z993 Dependence on wheelchair: Secondary | ICD-10-CM | POA: Diagnosis not present

## 2021-09-20 DIAGNOSIS — K3184 Gastroparesis: Secondary | ICD-10-CM | POA: Insufficient documentation

## 2021-09-20 DIAGNOSIS — F418 Other specified anxiety disorders: Secondary | ICD-10-CM | POA: Diagnosis not present

## 2021-09-20 DIAGNOSIS — R519 Headache, unspecified: Secondary | ICD-10-CM | POA: Diagnosis not present

## 2021-09-20 DIAGNOSIS — J449 Chronic obstructive pulmonary disease, unspecified: Secondary | ICD-10-CM | POA: Insufficient documentation

## 2021-09-20 DIAGNOSIS — E1143 Type 2 diabetes mellitus with diabetic autonomic (poly)neuropathy: Secondary | ICD-10-CM | POA: Diagnosis not present

## 2021-09-20 DIAGNOSIS — F17210 Nicotine dependence, cigarettes, uncomplicated: Secondary | ICD-10-CM | POA: Insufficient documentation

## 2021-09-20 DIAGNOSIS — K219 Gastro-esophageal reflux disease without esophagitis: Secondary | ICD-10-CM | POA: Insufficient documentation

## 2021-09-20 DIAGNOSIS — I10 Essential (primary) hypertension: Secondary | ICD-10-CM | POA: Insufficient documentation

## 2021-09-20 DIAGNOSIS — R011 Cardiac murmur, unspecified: Secondary | ICD-10-CM | POA: Insufficient documentation

## 2021-09-20 DIAGNOSIS — K8020 Calculus of gallbladder without cholecystitis without obstruction: Secondary | ICD-10-CM

## 2021-09-20 DIAGNOSIS — K811 Chronic cholecystitis: Secondary | ICD-10-CM | POA: Diagnosis not present

## 2021-09-20 DIAGNOSIS — D649 Anemia, unspecified: Secondary | ICD-10-CM | POA: Diagnosis not present

## 2021-09-20 HISTORY — PX: CHOLECYSTECTOMY: SHX55

## 2021-09-20 LAB — COMPREHENSIVE METABOLIC PANEL
ALT: 13 U/L (ref 0–44)
AST: 15 U/L (ref 15–41)
Albumin: 3.6 g/dL (ref 3.5–5.0)
Alkaline Phosphatase: 53 U/L (ref 38–126)
Anion gap: 9 (ref 5–15)
BUN: 12 mg/dL (ref 8–23)
CO2: 26 mmol/L (ref 22–32)
Calcium: 8.5 mg/dL — ABNORMAL LOW (ref 8.9–10.3)
Chloride: 104 mmol/L (ref 98–111)
Creatinine, Ser: 0.65 mg/dL (ref 0.44–1.00)
GFR, Estimated: >60 mL/min (ref >60)
Glucose, Bld: 221 mg/dL — ABNORMAL HIGH (ref 70–99)
Potassium: 3.8 mmol/L (ref 3.5–5.1)
Sodium: 139 mmol/L (ref 135–145)
Total Bilirubin: 0.2 mg/dL — ABNORMAL LOW (ref 0.3–1.2)
Total Protein: 6.8 g/dL (ref 6.5–8.1)

## 2021-09-20 LAB — PROTIME-INR
INR: 0.9 (ref 0.8–1.2)
Prothrombin Time: 12.5 seconds (ref 11.4–15.2)

## 2021-09-20 LAB — GLUCOSE, CAPILLARY: Glucose-Capillary: 218 mg/dL — ABNORMAL HIGH (ref 70–99)

## 2021-09-20 SURGERY — LAPAROSCOPIC CHOLECYSTECTOMY
Anesthesia: General | Site: Abdomen

## 2021-09-20 MED ORDER — DEXAMETHASONE SODIUM PHOSPHATE 10 MG/ML IJ SOLN
INTRAMUSCULAR | Status: AC
  Filled 2021-09-20: qty 1

## 2021-09-20 MED ORDER — HEMOSTATIC AGENTS (NO CHARGE) OPTIME
TOPICAL | Status: DC | PRN
  Administered 2021-09-20: 1 via TOPICAL

## 2021-09-20 MED ORDER — LIDOCAINE HCL (PF) 2 % IJ SOLN
INTRAMUSCULAR | Status: AC
  Filled 2021-09-20: qty 5

## 2021-09-20 MED ORDER — OXYCODONE HCL 5 MG PO TABS
5.0000 mg | ORAL_TABLET | Freq: Once | ORAL | Status: AC
  Administered 2021-09-20: 5 mg via ORAL

## 2021-09-20 MED ORDER — BUPIVACAINE HCL (PF) 0.5 % IJ SOLN
INTRAMUSCULAR | Status: AC
  Filled 2021-09-20: qty 30

## 2021-09-20 MED ORDER — PROPOFOL 10 MG/ML IV BOLUS
INTRAVENOUS | Status: AC
  Filled 2021-09-20: qty 20

## 2021-09-20 MED ORDER — OXYCODONE HCL 5 MG PO TABS
ORAL_TABLET | ORAL | Status: AC
  Filled 2021-09-20: qty 1

## 2021-09-20 MED ORDER — PROPOFOL 10 MG/ML IV BOLUS
INTRAVENOUS | Status: DC | PRN
  Administered 2021-09-20: 150 mg via INTRAVENOUS

## 2021-09-20 MED ORDER — SCOPOLAMINE 1 MG/3DAYS TD PT72
MEDICATED_PATCH | TRANSDERMAL | Status: AC
  Filled 2021-09-20: qty 1

## 2021-09-20 MED ORDER — CHLORHEXIDINE GLUCONATE 0.12 % MT SOLN: 15.0000 mL | Freq: Once | OROMUCOSAL | Status: DC

## 2021-09-20 MED ORDER — ONDANSETRON HCL 4 MG PO TABS: 4.0000 mg | ORAL_TABLET | Freq: Three times a day (TID) | ORAL | 0 refills | Status: DC | PRN

## 2021-09-20 MED ORDER — ONDANSETRON HCL 4 MG/2ML IJ SOLN
INTRAMUSCULAR | Status: AC
  Filled 2021-09-20: qty 4

## 2021-09-20 MED ORDER — HYDROMORPHONE HCL 1 MG/ML IJ SOLN
0.2500 mg | INTRAMUSCULAR | Status: DC | PRN
  Administered 2021-09-20 (×4): 0.5 mg via INTRAVENOUS
  Filled 2021-09-20 (×3): qty 0.5

## 2021-09-20 MED ORDER — SUGAMMADEX SODIUM 200 MG/2ML IV SOLN
INTRAVENOUS | Status: DC | PRN
  Administered 2021-09-20: 200 mg via INTRAVENOUS

## 2021-09-20 MED ORDER — ROCURONIUM BROMIDE 10 MG/ML (PF) SYRINGE
PREFILLED_SYRINGE | INTRAVENOUS | Status: AC
  Filled 2021-09-20: qty 10

## 2021-09-20 MED ORDER — ONDANSETRON HCL 4 MG/2ML IJ SOLN
INTRAMUSCULAR | Status: DC | PRN
Start: 2021-09-20 — End: 2021-09-20
  Administered 2021-09-20: 4 mg via INTRAVENOUS

## 2021-09-20 MED ORDER — ONDANSETRON HCL 4 MG/2ML IJ SOLN
INTRAMUSCULAR | Status: AC
  Filled 2021-09-20: qty 2

## 2021-09-20 MED ORDER — LIDOCAINE 2% (20 MG/ML) 5 ML SYRINGE
INTRAMUSCULAR | Status: DC | PRN
  Administered 2021-09-20: 80 mg via INTRAVENOUS

## 2021-09-20 MED ORDER — CHLORHEXIDINE GLUCONATE CLOTH 2 % EX PADS: 6.0000 | MEDICATED_PAD | Freq: Once | CUTANEOUS | Status: DC

## 2021-09-20 MED ORDER — KETAMINE HCL 50 MG/5ML IJ SOSY
PREFILLED_SYRINGE | INTRAMUSCULAR | Status: AC
  Filled 2021-09-20: qty 5

## 2021-09-20 MED ORDER — ROCURONIUM BROMIDE 10 MG/ML (PF) SYRINGE
PREFILLED_SYRINGE | INTRAVENOUS | Status: DC | PRN
  Administered 2021-09-20: 70 mg via INTRAVENOUS

## 2021-09-20 MED ORDER — ONDANSETRON HCL 4 MG/2ML IJ SOLN
4.0000 mg | Freq: Once | INTRAMUSCULAR | Status: AC | PRN
  Administered 2021-09-20: 4 mg via INTRAVENOUS
  Filled 2021-09-20: qty 2

## 2021-09-20 MED ORDER — MIDAZOLAM HCL 2 MG/2ML IJ SOLN
INTRAMUSCULAR | Status: AC
  Filled 2021-09-20: qty 2

## 2021-09-20 MED ORDER — CHLORHEXIDINE GLUCONATE CLOTH 2 % EX PADS
6.0000 | MEDICATED_PAD | Freq: Once | CUTANEOUS | Status: DC
Start: 2021-09-20 — End: 2021-09-20

## 2021-09-20 MED ORDER — OXYCODONE HCL 5 MG PO TABS: 5.0000 mg | ORAL_TABLET | ORAL | 0 refills | Status: DC | PRN

## 2021-09-20 MED ORDER — ORAL CARE MOUTH RINSE: 15.0000 mL | Freq: Once | OROMUCOSAL | Status: DC

## 2021-09-20 MED ORDER — FENTANYL CITRATE (PF) 250 MCG/5ML IJ SOLN
INTRAMUSCULAR | Status: DC | PRN
  Administered 2021-09-20: 50 ug via INTRAVENOUS
  Administered 2021-09-20 (×2): 100 ug via INTRAVENOUS

## 2021-09-20 MED ORDER — MIDAZOLAM HCL 5 MG/5ML IJ SOLN
INTRAMUSCULAR | Status: DC | PRN
  Administered 2021-09-20: 2 mg via INTRAVENOUS

## 2021-09-20 MED ORDER — MIDAZOLAM HCL 2 MG/2ML IJ SOLN
1.0000 mg | INTRAMUSCULAR | Status: AC | PRN
  Administered 2021-09-20 (×2): 1 mg via INTRAVENOUS

## 2021-09-20 MED ORDER — ROCURONIUM BROMIDE 10 MG/ML (PF) SYRINGE
PREFILLED_SYRINGE | INTRAVENOUS | Status: AC
  Filled 2021-09-20: qty 20

## 2021-09-20 MED ORDER — DEXAMETHASONE SODIUM PHOSPHATE 10 MG/ML IJ SOLN
INTRAMUSCULAR | Status: AC
  Filled 2021-09-20: qty 2

## 2021-09-20 MED ORDER — HYDROMORPHONE HCL 1 MG/ML IJ SOLN
INTRAMUSCULAR | Status: AC
  Filled 2021-09-20: qty 0.5

## 2021-09-20 MED ORDER — BUPIVACAINE HCL (PF) 0.5 % IJ SOLN
INTRAMUSCULAR | Status: DC | PRN
  Administered 2021-09-20: 10 mL

## 2021-09-20 MED ORDER — LACTATED RINGERS IV SOLN: INTRAVENOUS | Status: DC

## 2021-09-20 MED ORDER — SODIUM CHLORIDE 0.9 % IR SOLN
Status: DC | PRN
  Administered 2021-09-20: 1000 mL

## 2021-09-20 MED ORDER — DEXMEDETOMIDINE (PRECEDEX) IN NS 20 MCG/5ML (4 MCG/ML) IV SYRINGE
PREFILLED_SYRINGE | INTRAVENOUS | Status: AC
  Filled 2021-09-20: qty 5

## 2021-09-20 MED ORDER — SUGAMMADEX SODIUM 500 MG/5ML IV SOLN
INTRAVENOUS | Status: AC
  Filled 2021-09-20: qty 5

## 2021-09-20 MED ORDER — SODIUM CHLORIDE 0.9 % IV SOLN
2.0000 g | INTRAVENOUS | Status: AC
  Administered 2021-09-20: 2 g via INTRAVENOUS

## 2021-09-20 MED ORDER — FENTANYL CITRATE (PF) 250 MCG/5ML IJ SOLN
INTRAMUSCULAR | Status: AC
  Filled 2021-09-20: qty 5

## 2021-09-20 MED ORDER — DEXAMETHASONE SODIUM PHOSPHATE 10 MG/ML IJ SOLN
INTRAMUSCULAR | Status: DC | PRN
  Administered 2021-09-20: 10 mg via INTRAVENOUS

## 2021-09-20 MED ORDER — DEXMEDETOMIDINE (PRECEDEX) IN NS 20 MCG/5ML (4 MCG/ML) IV SYRINGE
PREFILLED_SYRINGE | INTRAVENOUS | Status: DC | PRN
  Administered 2021-09-20 (×3): 4 ug via INTRAVENOUS
  Administered 2021-09-20: 8 ug via INTRAVENOUS

## 2021-09-20 MED ORDER — SCOPOLAMINE 1 MG/3DAYS TD PT72: 1.0000 | MEDICATED_PATCH | Freq: Once | TRANSDERMAL | Status: DC

## 2021-09-20 MED ORDER — KETAMINE HCL 10 MG/ML IJ SOLN
INTRAMUSCULAR | Status: DC | PRN
  Administered 2021-09-20: 20 mg via INTRAVENOUS
  Administered 2021-09-20: 30 mg via INTRAVENOUS

## 2021-09-20 MED ORDER — SODIUM CHLORIDE 0.9 % IV SOLN
INTRAVENOUS | Status: AC
  Filled 2021-09-20: qty 2

## 2021-09-20 SURGICAL SUPPLY — 38 items
APPLIER CLIP ROT 10 11.4 M/L (STAPLE) ×2
BAG RETRIEVAL 10 (BASKET) ×1
BLADE SURG 15 STRL LF DISP TIS (BLADE) ×1 IMPLANT
BLADE SURG 15 STRL SS (BLADE) ×1
CHLORAPREP W/TINT 26 (MISCELLANEOUS) ×2 IMPLANT
CLIP APPLIE ROT 10 11.4 M/L (STAPLE) ×1 IMPLANT
CLOTH BEACON ORANGE TIMEOUT ST (SAFETY) ×2 IMPLANT
COVER LIGHT HANDLE STERIS (MISCELLANEOUS) ×4 IMPLANT
DECANTER SPIKE VIAL GLASS SM (MISCELLANEOUS) ×2 IMPLANT
DERMABOND ADVANCED (GAUZE/BANDAGES/DRESSINGS) ×1
DERMABOND ADVANCED .7 DNX12 (GAUZE/BANDAGES/DRESSINGS) ×1 IMPLANT
ELECT REM PT RETURN 9FT ADLT (ELECTROSURGICAL) ×2
ELECTRODE REM PT RTRN 9FT ADLT (ELECTROSURGICAL) ×1 IMPLANT
GAUZE 4X4 16PLY ~~LOC~~+RFID DBL (SPONGE) ×2 IMPLANT
GLOVE SURG ENC MOIS LTX SZ6.5 (GLOVE) ×2 IMPLANT
GLOVE SURG UNDER POLY LF SZ7 (GLOVE) ×8 IMPLANT
GOWN STRL REUS W/TWL LRG LVL3 (GOWN DISPOSABLE) ×6 IMPLANT
HEMOSTAT SNOW SURGICEL 2X4 (HEMOSTASIS) ×2 IMPLANT
INST SET LAPROSCOPIC AP (KITS) ×2 IMPLANT
KIT TURNOVER KIT A (KITS) ×2 IMPLANT
MANIFOLD NEPTUNE II (INSTRUMENTS) ×2 IMPLANT
NDL INSUFFLATION 14GA 120MM (NEEDLE) ×1 IMPLANT
NEEDLE INSUFFLATION 14GA 120MM (NEEDLE) ×2 IMPLANT
NS IRRIG 1000ML POUR BTL (IV SOLUTION) ×2 IMPLANT
PACK LAP CHOLE LZT030E (CUSTOM PROCEDURE TRAY) ×2 IMPLANT
PAD ARMBOARD 7.5X6 YLW CONV (MISCELLANEOUS) ×2 IMPLANT
SET BASIN LINEN APH (SET/KITS/TRAYS/PACK) ×2 IMPLANT
SET TUBE SMOKE EVAC HIGH FLOW (TUBING) ×2 IMPLANT
SLEEVE ENDOPATH XCEL 5M (ENDOMECHANICALS) ×2 IMPLANT
SUT MNCRL AB 4-0 PS2 18 (SUTURE) ×4 IMPLANT
SUT VICRYL 0 UR6 27IN ABS (SUTURE) ×2 IMPLANT
SYS BAG RETRIEVAL 10MM (BASKET) ×1
SYSTEM BAG RETRIEVAL 10MM (BASKET) ×1 IMPLANT
TROCAR ENDO BLADELESS 11MM (ENDOMECHANICALS) ×2 IMPLANT
TROCAR XCEL NON-BLD 5MMX100MML (ENDOMECHANICALS) ×2 IMPLANT
TROCAR XCEL UNIV SLVE 11M 100M (ENDOMECHANICALS) ×2 IMPLANT
TUBE CONNECTING 12X1/4 (SUCTIONS) ×2 IMPLANT
WARMER LAPAROSCOPE (MISCELLANEOUS) ×2 IMPLANT

## 2021-09-20 NOTE — Op Note (Addendum)
Operative Note   Preoperative Diagnosis: Symptomatic cholelithiasis   Postoperative Diagnosis: Same   Procedure(s) Performed: Laparoscopic cholecystectomy   Surgeon: Lillia Abed C. Henreitta Leber, MD   Assistants: Franky Macho, MD    Anesthesia: General endotracheal   Anesthesiologist: Windell Norfolk, MD    Specimens: Gallbladder    Estimated Blood Loss: Minimal    Blood Replacement: None    Complications: None    Operative Findings: Distended gallbladder with distention    Procedure: The patient was taken to the operating room and placed supine. General endotracheal anesthesia was induced. Intravenous antibiotics were administered per protocol. An orogastric tube positioned to decompress the stomach. The abdomen was prepared and draped in the usual sterile fashion.    A supraumbilical incision was made and a Veress technique was utilized to achieve pneumoperitoneum to 15 mmHg with carbon dioxide. A 11 mm optiview port was placed through the prior hernia /umbilical region, and a 10 mm 0-degree operative laparoscope was introduced. The area underlying the trocar and Veress needle were inspected and without evidence of injury.  There was some omentum adherent in her umbilical hernia. Remaining trocars were placed under direct vision. Two 5 mm ports were placed in the right abdomen, between the anterior axillary and midclavicular line.  A final 11 mm port was placed through the mid-epigastrium, near the falciform ligament. The camera was used to look back at the umbilical site and a omentum was taken down with cautery. No signs of bleeding were noted and no injury to the bowel.    The gallbladder fundus was elevated cephalad and the infundibulum was retracted to the patient's right. The gallbladder/cystic duct junction was skeletonized. The cystic artery noted in the triangle of Calot and was also skeletonized.  We then continued liberal medial and lateral dissection until the critical view of  safety was achieved.    The cystic duct and cystic artery were triply clipped and divided. The gallbladder was then dissected from the liver bed with electrocautery. The specimen was placed in an Endopouch and was retrieved through the epigastric site.   Final inspection revealed acceptable hemostasis. Surgical SNOW was placed in the gallbladder bed.  Trocars were removed and pneumoperitoneum was released.  0 Vicryl fascial sutures were used to close the epigastric and umbilical port sites including the prior umbilical hernia. Skin incisions were closed with 4-0 Monocryl subcuticular sutures and Dermabond. The patient was awakened from anesthesia and extubated without complication.    [name], MD [facility] [address] [place], [place] [address] [phone] (office)

## 2021-09-20 NOTE — Anesthesia Postprocedure Evaluation (Signed)
Anesthesia Post Note  Patient: Gloria Chapman  Procedure(s) Performed: LAPAROSCOPIC CHOLECYSTECTOMY (Abdomen)  Patient location during evaluation: PACU Anesthesia Type: General Level of consciousness: awake and alert Pain management: pain level controlled Vital Signs Assessment: post-procedure vital signs reviewed and stable Respiratory status: spontaneous breathing, nonlabored ventilation, respiratory function stable and patient connected to nasal cannula oxygen Cardiovascular status: blood pressure returned to baseline and stable Postop Assessment: no apparent nausea or vomiting Anesthetic complications: no   No notable events documented.   Last Vitals:  Vitals:   09/20/21 1000 09/20/21 1130  BP: (!) 156/89 (!) 156/87  Pulse: 79 86  Resp: 17 19  Temp: 36.9 C 36.9 C  SpO2: 97% 97%    Last Pain:  Vitals:   09/20/21 1000  PainSc: [place]

## 2021-09-20 NOTE — Anesthesia Preprocedure Evaluation (Signed)
Anesthesia Evaluation  Patient identified by MRN, date of birth, ID band Patient awake    Reviewed: Allergy & Precautions, H&P , NPO status , Patient's Chart, lab work & pertinent test results, reviewed documented beta blocker date and time   History of Anesthesia Complications (+) PONV and history of anesthetic complications  Airway Mallampati: II  TM Distance: >3 FB Neck ROM: full    Dental no notable dental hx.    Pulmonary shortness of breath, COPD, Current Smoker and Patient abstained from smoking.,    Pulmonary exam normal breath sounds clear to auscultation       Cardiovascular Exercise Tolerance: Good hypertension, + Valvular Problems/Murmurs  Rhythm:regular Rate:Normal     Neuro/Psych  Headaches, PSYCHIATRIC DISORDERS Anxiety Depression  Neuromuscular disease    GI/Hepatic GERD  Medicated,(+) Hepatitis -, C  Endo/Other  diabetesMorbid obesity  Renal/GU negative Renal ROS  negative genitourinary   Musculoskeletal   Abdominal   Peds  Hematology  (+) Blood dyscrasia, anemia ,   Anesthesia Other Findings   Reproductive/Obstetrics negative OB ROS                             Anesthesia Physical Anesthesia Plan  ASA: 3  Anesthesia Plan: General and General ETT   Post-op Pain Management:    Induction:   PONV Risk Score and Plan: Ondansetron and Scopolamine patch - Pre-op  Airway Management Planned:   Additional Equipment:   Intra-op Plan:   Post-operative Plan:   Informed Consent: I have reviewed the patients History and Physical, chart, labs and discussed the procedure including the risks, benefits and alternatives for the proposed anesthesia with the patient or authorized representative who has indicated his/her understanding and acceptance.     Dental Advisory Given  Plan Discussed with: CRNA  Anesthesia Plan Comments:         Anesthesia Quick Evaluation

## 2021-09-20 NOTE — Interval H&P Note (Signed)
History and Physical Interval Note:  09/20/2021 10:10 AM  Gloria Chapman  has presented today for surgery, with the diagnosis of Cholelithiasis.  The various methods of treatment have been discussed with the patient and family. After consideration of risks, benefits and other options for treatment, the patient has consented to  Procedure(s): LAPAROSCOPIC CHOLECYSTECTOMY (N/A) as a surgical intervention.  The patient's history has been reviewed, patient examined, no change in status, stable for surgery.  I have reviewed the patient's chart and labs.  Questions were answered to the patient's satisfaction.    Discussed all her symptoms may not be from the gallbladder. She lives at [facility]. Will prescribe a few Roxicodone for breakthrough pain.   Virl Cagey

## 2021-09-20 NOTE — Anesthesia Procedure Notes (Signed)
Procedure Name: Intubation Date/Time: 09/20/2021 10:35 AM Performed by: Maude Leriche, CRNA Pre-anesthesia Checklist: Patient identified, Emergency Drugs available, Suction available and Patient being monitored Patient Re-evaluated:Patient Re-evaluated prior to induction Oxygen Delivery Method: Circle system utilized Preoxygenation: Pre-oxygenation with 100% oxygen Induction Type: IV induction Ventilation: Mask ventilation without difficulty Laryngoscope Size: Glidescope and 3 Grade View: Grade I Tube type: Oral Tube size: 7.0 mm Number of attempts: 1 Airway Equipment and Method: Video-laryngoscopy and Stylet Placement Confirmation: ETT inserted through vocal cords under direct vision, positive ETCO2 and breath sounds checked- equal and bilateral Secured at: 21 cm Tube secured with: Tape Dental Injury: Teeth and Oropharynx as per pre-operative assessment  Comments: Excess redundant tissue observed during video laryngoscopy

## 2021-09-20 NOTE — Progress Notes (Signed)
Talked with Zerita Boers at [facility], [place]. Explained pt concern. Was in agreement. Voiced understanding.

## 2021-09-20 NOTE — Progress Notes (Signed)
Pt is a resident of [facility] in [place]. She voiced concern about being " alone" after surgery. Informed pt that I would call and talk to her nurse and make sure she was aware to check on pt after returns to [facility]. Pt voiced understanding.

## 2021-09-20 NOTE — Discharge Instructions (Signed)
Discharge Laparoscopic Surgery Instructions:  Common Complaints: Right shoulder pain is common after laparoscopic surgery. This is secondary to the gas used in the surgery being trapped under the diaphragm.  Walk to help your body absorb the gas. This will improve in a few days. Pain at the port sites are common, especially the larger port sites. This will improve with time.  Some nausea is common and poor appetite. The main goal is to stay hydrated the first few days after surgery.   Diet/ Activity: Diet as tolerated. You may not have an appetite, but it is important to stay hydrated. Drink 64 ounces of water a day. Your appetite will return with time.  Shower per your regular routine daily.  Do not take hot showers. Take warm showers that are less than 10 minutes. Rest and listen to your body, but do not remain in bed all day.  Walk everyday for at least 15-20 minutes. Deep cough and move around every 1-2 hours in the first few days after surgery.  Do not lift > 10 lbs, perform excessive bending, pushing, pulling, squatting for 1-2 weeks after surgery.  Do not pick at the dermabond glue on your incision sites.  This glue film will remain in place for 1-2 weeks and will start to peel off.  Do not place lotions or balms on your incision unless instructed to specifically by Dr. Constance Haw.   Pain Expectations and Narcotics: -After surgery you will have pain associated with your incisions and this is normal. The pain is muscular and nerve pain, and will get better with time. -You are encouraged and expected to take non narcotic medications like tylenol and ibuprofen (when able) to treat pain as multiple modalities can aid with pain treatment. -Narcotics are only used when pain is severe or there is breakthrough pain. -You are not expected to have a pain score of 0 after surgery, as we cannot prevent pain. A pain score of 3-4 that allows you to be functional, move, walk, and tolerate some activity is  the goal. The pain will continue to improve over the days after surgery and is dependent on your surgery. -Due to [place] law, we are only able to give a certain amount of pain medication to treat post operative pain, and we only give additional narcotics on a patient by patient basis.  -For most laparoscopic surgery, studies have shown that the majority of patients only need 10-15 narcotic pills, and for open surgeries most patients only need 15-20.   -Having appropriate expectations of pain and knowledge of pain management with non narcotics is important as we do not want anyone to become addicted to narcotic pain medication.  -Using ice packs in the first 48 hours and heating pads after 48 hours, wearing an abdominal binder (when recommended), and using over the counter medications are all ways to help with pain management.   -Simple acts like meditation and mindfulness practices after surgery can also help with pain control and research has proven the benefit of these practices.  Medication: Take tylenol and ibuprofen as needed for pain control, alternating every 4-6 hours.  Example:  Take Roxicodone for breakthrough pain every 4 hours.  You can take this in addition to your percocet prescribed by the SNF if needed.  Take Colace for constipation related to narcotic pain medication. If you do not have a bowel movement in 2 days, take Miralax over the counter.  Drink plenty of water to also prevent constipation.   Contact  Information: If you have questions or concerns, please call our office, [phone], Monday- Thursday 8AM-5PM and Friday 8AM-12Noon.  If it is after hours or on the weekend, please call [facility], [phone], [phone], and ask to speak to the surgeon on call for Dr. Constance Haw at [facility].

## 2021-09-20 NOTE — Progress Notes (Signed)
[facility]  Patient from SNF and going back. RN called SNF to notify them that she is post op and will need to be watched for 24 hours.   Rx printed for additional roxicodone 5 mg q 4 PRN #10 for breakthrough pain in addition to her percocet she takes at baseline. Printed out.   Diet as tolerated. No additional person that patient wanted [place] to call.   [name], MD [facility] [address] [place], [place] [address] [phone] (office)

## 2021-09-20 NOTE — Transfer of Care (Signed)
Immediate Anesthesia Transfer of Care Note  Patient: Gloria Chapman  Procedure(s) Performed: LAPAROSCOPIC CHOLECYSTECTOMY (Abdomen)  Patient Location: PACU  Anesthesia Type:General  Level of Consciousness: awake, alert  and oriented  Airway & Oxygen Therapy: Patient Spontanous Breathing and Patient connected to face mask oxygen  Post-op Assessment: Report given to RN, Post -op Vital signs reviewed and stable, Patient moving all extremities X 4 and Patient able to stick tongue midline  Post vital signs: Reviewed  Last Vitals:  Vitals Value Taken Time  BP 156/87 09/20/21 1130  Temp 98.4   Pulse 86 09/20/21 1131  Resp 19 09/20/21 1131  SpO2 98 % 09/20/21 1131  Vitals shown include unvalidated device data.  Last Pain:  Vitals:   09/20/21 1000  PainSc: 7          Complications: No notable events documented.

## 2021-09-21 ENCOUNTER — Encounter (HOSPITAL_COMMUNITY): Payer: Self-pay | Admitting: General Surgery

## 2021-09-21 LAB — SURGICAL PATHOLOGY

## 2021-09-26 NOTE — Progress Notes (Signed)
[facility]  Order placed by me for labs per anesthesia. The preoperative team ordered labs for this patient for her surgery including CMP and INR. Patient is on Aspirin at baseline and has multiple medical co-morbidities. Labs by the preoperative team are ordered based on protocol.   [name], MD [facility] [address] [place], [place] [address] [phone] (office)

## 2021-10-05 ENCOUNTER — Encounter: Payer: Self-pay | Admitting: General Surgery

## 2021-10-05 ENCOUNTER — Other Ambulatory Visit: Payer: Self-pay

## 2021-10-05 ENCOUNTER — Ambulatory Visit (INDEPENDENT_AMBULATORY_CARE_PROVIDER_SITE_OTHER): Payer: Medicare Other | Admitting: General Surgery

## 2021-10-05 ENCOUNTER — Encounter: Payer: Medicare Other | Admitting: General Surgery

## 2021-10-05 ENCOUNTER — Encounter: Payer: Self-pay | Admitting: *Deleted

## 2021-10-05 VITALS — BP 124/82 | HR 85 | Temp 97.8°F | Resp 18 | Ht 62.0 in | Wt 222.0 lb

## 2021-10-05 DIAGNOSIS — K429 Umbilical hernia without obstruction or gangrene: Secondary | ICD-10-CM

## 2021-10-05 DIAGNOSIS — R110 Nausea: Secondary | ICD-10-CM

## 2021-10-05 DIAGNOSIS — K828 Other specified diseases of gallbladder: Secondary | ICD-10-CM

## 2021-10-05 MED ORDER — ONDANSETRON HCL 4 MG PO TABS: 4.0000 mg | ORAL_TABLET | Freq: Three times a day (TID) | ORAL | 0 refills | Status: DC | PRN

## 2021-10-05 MED ORDER — OXYCODONE HCL 5 MG PO TABS: 5.0000 mg | ORAL_TABLET | ORAL | 0 refills | Status: DC | PRN

## 2021-10-05 NOTE — Patient Instructions (Addendum)
Will order a CT to see if umbilical hernia recurred/ worse, or what is going on.  Will get you an appointment once the CT is scheduled.    Diet as tolerated Will see after CT  1 time refill Roxicodone 5 mg q 4 PRN # 8 sent for umbilical pain 1 time refill zofran for nausea

## 2021-10-05 NOTE — Progress Notes (Signed)
[facility]  Still with pain in the umbilical area. She had a hernia in that area and I used it for a port site and closed it. She says 4 days ago the pain became acutely worse.   She is also still having nausea. Feels lot of pressure in her abdomen.   BP 124/82    Pulse 85    Temp 97.8 F (36.6 C) (Other (Comment))    Resp 18    Ht 5\' 2"  (1.575 m)    Wt 222 lb (100.7 kg)    SpO2 96%    BMI 40.60 kg/m  Soft, umbilical area, umbilical stalk noted but no obvious hernia, port sites c/d/I with no erythema or drainage, difficult to exclude hernia given tenderness and stalk   Patient s/p lapraoscopic cholecystecotmy. Was doing better but now with acute pain in the umbilical area when she was sleeping 4 days ago.  CT a/p ordered to see if umbilical hernia recurred Diet as tolerated Will see after CT  1 time refill Roxicodone 5 mg q 4 PRN # 8 sent for umbilical pain 1 time refill zofran for nausea   Pathology: FINAL MICROSCOPIC DIAGNOSIS:   A. GALLBLADDER, CHOLECYSTECTOMY:  - Chronic cholecystitis, without cholelithiasis.  - Negative for dysplasia and malignancy.   , MD [facility] [address] [place], [place] [phone] (office)

## 2021-10-12 ENCOUNTER — Encounter: Payer: Medicare Other | Admitting: General Surgery

## 2021-11-04 ENCOUNTER — Emergency Department (HOSPITAL_COMMUNITY)
Admission: EM | Admit: 2021-11-04 | Discharge: 2021-11-04 | Disposition: A | Discharge status: Home / Self Care | Payer: Medicare Other | Attending: Emergency Medicine | Admitting: Emergency Medicine

## 2021-11-04 ENCOUNTER — Encounter (HOSPITAL_COMMUNITY): Payer: Self-pay

## 2021-11-04 ENCOUNTER — Other Ambulatory Visit: Payer: Self-pay

## 2021-11-04 ENCOUNTER — Emergency Department (HOSPITAL_COMMUNITY): Payer: Medicare Other

## 2021-11-04 DIAGNOSIS — J449 Chronic obstructive pulmonary disease, unspecified: Secondary | ICD-10-CM

## 2021-11-04 DIAGNOSIS — Z794 Long term (current) use of insulin: Secondary | ICD-10-CM | POA: Insufficient documentation

## 2021-11-04 DIAGNOSIS — Z8639 Personal history of other endocrine, nutritional and metabolic disease: Secondary | ICD-10-CM

## 2021-11-04 DIAGNOSIS — Z7982 Long term (current) use of aspirin: Secondary | ICD-10-CM | POA: Diagnosis not present

## 2021-11-04 DIAGNOSIS — E119 Type 2 diabetes mellitus without complications: Secondary | ICD-10-CM | POA: Insufficient documentation

## 2021-11-04 DIAGNOSIS — F17210 Nicotine dependence, cigarettes, uncomplicated: Secondary | ICD-10-CM | POA: Insufficient documentation

## 2021-11-04 DIAGNOSIS — Z7984 Long term (current) use of oral hypoglycemic drugs: Secondary | ICD-10-CM | POA: Insufficient documentation

## 2021-11-04 DIAGNOSIS — Z720 Tobacco use: Secondary | ICD-10-CM

## 2021-11-04 DIAGNOSIS — U071 COVID-19: Secondary | ICD-10-CM | POA: Diagnosis not present

## 2021-11-04 DIAGNOSIS — R0602 Shortness of breath: Secondary | ICD-10-CM | POA: Diagnosis present

## 2021-11-04 LAB — CBC WITH DIFFERENTIAL/PLATELET
Abs Immature Granulocytes: 0.07 10*3/uL (ref 0.00–0.07)
Basophils Absolute: 0.1 10*3/uL (ref 0.0–0.1)
Basophils Relative: 0 %
Eosinophils Absolute: 0.2 10*3/uL (ref 0.0–0.5)
Eosinophils Relative: 2 %
HCT: 43.5 % (ref 36.0–46.0)
Hemoglobin: 14.2 g/dL (ref 12.0–15.0)
Immature Granulocytes: 1 %
Lymphocytes Relative: 22 %
Lymphs Abs: 2.9 10*3/uL (ref 0.7–4.0)
MCH: 29 pg (ref 26.0–34.0)
MCHC: 32.6 g/dL (ref 30.0–36.0)
MCV: 89 fL (ref 80.0–100.0)
Monocytes Absolute: 0.6 10*3/uL (ref 0.1–1.0)
Monocytes Relative: 5 %
Neutro Abs: 9.2 10*3/uL — ABNORMAL HIGH (ref 1.7–7.7)
Neutrophils Relative %: 70 %
Platelets: 255 10*3/uL (ref 150–400)
RBC: 4.89 MIL/uL (ref 3.87–5.11)
RDW: 13.6 % (ref 11.5–15.5)
WBC: 13 10*3/uL — ABNORMAL HIGH (ref 4.0–10.5)
nRBC: 0 % (ref 0.0–0.2)

## 2021-11-04 LAB — COMPREHENSIVE METABOLIC PANEL
ALT: 13 U/L (ref 0–44)
AST: 17 U/L (ref 15–41)
Albumin: 4 g/dL (ref 3.5–5.0)
Alkaline Phosphatase: 67 U/L (ref 38–126)
Anion gap: 9 (ref 5–15)
BUN: 11 mg/dL (ref 8–23)
CO2: 26 mmol/L (ref 22–32)
Calcium: 9 mg/dL (ref 8.9–10.3)
Chloride: 98 mmol/L (ref 98–111)
Creatinine, Ser: 0.74 mg/dL (ref 0.44–1.00)
GFR, Estimated: >60 mL/min (ref >60)
Glucose, Bld: 142 mg/dL — ABNORMAL HIGH (ref 70–99)
Potassium: 3.8 mmol/L (ref 3.5–5.1)
Sodium: 133 mmol/L — ABNORMAL LOW (ref 135–145)
Total Bilirubin: 0.3 mg/dL (ref 0.3–1.2)
Total Protein: 7.7 g/dL (ref 6.5–8.1)

## 2021-11-04 LAB — RESP PANEL BY RT-PCR (FLU A&B, COVID) ARPGX2
Influenza A by PCR: NEGATIVE
Influenza B by PCR: NEGATIVE
SARS Coronavirus 2 by RT PCR: POSITIVE — AB

## 2021-11-04 LAB — BLOOD GAS, VENOUS
Acid-Base Excess: 5.2 mmol/L — ABNORMAL HIGH (ref 0.0–2.0)
Bicarbonate: 29.9 mmol/L — ABNORMAL HIGH (ref 20.0–28.0)
Drawn by: 27160
FIO2: 21 %
O2 Saturation: 76.7 %
Patient temperature: 36.5
pCO2, Ven: 42 mmHg — ABNORMAL LOW (ref 44–60)
pH, Ven: 7.46 — ABNORMAL HIGH (ref 7.25–7.43)
pO2, Ven: 43 mmHg (ref 32–45)

## 2021-11-04 MED ORDER — CRANBERRY 450 MG PO TABS
450.0000 mg | ORAL_TABLET | Freq: Two times a day (BID) | ORAL | Status: DC
Start: 2021-11-04 — End: 2021-11-04

## 2021-11-04 MED ORDER — LORAZEPAM 0.5 MG PO TABS
0.5000 mg | ORAL_TABLET | Freq: Three times a day (TID) | ORAL | Status: DC
  Administered 2021-11-04: 0.5 mg via ORAL
  Filled 2021-11-04: qty 1

## 2021-11-04 MED ORDER — OXYCODONE HCL 5 MG PO TABS: 5.0000 mg | ORAL_TABLET | ORAL | Status: DC | PRN

## 2021-11-04 MED ORDER — INSULIN GLARGINE-YFGN 100 UNIT/ML ~~LOC~~ SOLN
43.0000 [IU] | Freq: Every day | SUBCUTANEOUS | Status: DC
  Filled 2021-11-04: qty 0.43

## 2021-11-04 MED ORDER — ACETAMINOPHEN 325 MG PO TABS
325.0000 mg | ORAL_TABLET | Freq: Three times a day (TID) | ORAL | Status: DC
  Administered 2021-11-04: 325 mg via ORAL
  Filled 2021-11-04: qty 1

## 2021-11-04 MED ORDER — OXYCODONE-ACETAMINOPHEN 5-325 MG PO TABS
1.0000 | ORAL_TABLET | Freq: Once | ORAL | Status: AC
  Administered 2021-11-04: 1 via ORAL
  Filled 2021-11-04: qty 1

## 2021-11-04 MED ORDER — OXYCODONE HCL 5 MG PO TABS
10.0000 mg | ORAL_TABLET | Freq: Three times a day (TID) | ORAL | Status: DC
  Administered 2021-11-04: 10 mg via ORAL
  Filled 2021-11-04: qty 2

## 2021-11-04 MED ORDER — GABAPENTIN 300 MG PO CAPS
600.0000 mg | ORAL_CAPSULE | Freq: Three times a day (TID) | ORAL | Status: DC
  Administered 2021-11-04: 600 mg via ORAL
  Filled 2021-11-04: qty 2

## 2021-11-04 MED ORDER — METHYLPREDNISOLONE SODIUM SUCC 125 MG IJ SOLR
125.0000 mg | Freq: Once | INTRAMUSCULAR | Status: AC
  Administered 2021-11-04: 125 mg via INTRAVENOUS
  Filled 2021-11-04: qty 2

## 2021-11-04 MED ORDER — GUAIFENESIN 100 MG/5ML PO LIQD: 20.0000 mL | ORAL | Status: DC | PRN

## 2021-11-04 MED ORDER — POTASSIUM CHLORIDE CRYS ER 20 MEQ PO TBCR: 10.0000 meq | EXTENDED_RELEASE_TABLET | Freq: Every day | ORAL | Status: DC

## 2021-11-04 MED ORDER — BENZONATATE 100 MG PO CAPS: 100.0000 mg | ORAL_CAPSULE | Freq: Four times a day (QID) | ORAL | Status: DC | PRN

## 2021-11-04 MED ORDER — ONDANSETRON HCL 4 MG PO TABS: 4.0000 mg | ORAL_TABLET | Freq: Three times a day (TID) | ORAL | Status: DC | PRN

## 2021-11-04 MED ORDER — TRAZODONE HCL 50 MG PO TABS
200.0000 mg | ORAL_TABLET | Freq: Every day | ORAL | Status: DC
  Administered 2021-11-04: 200 mg via ORAL
  Filled 2021-11-04: qty 4

## 2021-11-04 MED ORDER — ASPIRIN EC 81 MG PO TBEC
81.0000 mg | DELAYED_RELEASE_TABLET | Freq: Every evening | ORAL | Status: DC
Start: 2021-11-04 — End: 2021-11-05
  Administered 2021-11-04: 81 mg via ORAL
  Filled 2021-11-04: qty 1

## 2021-11-04 MED ORDER — PROMETHAZINE HCL 12.5 MG PO TABS
12.5000 mg | ORAL_TABLET | Freq: Three times a day (TID) | ORAL | Status: DC | PRN
  Filled 2021-11-04: qty 1

## 2021-11-04 MED ORDER — FUROSEMIDE 40 MG PO TABS: 40.0000 mg | ORAL_TABLET | Freq: Every morning | ORAL | Status: DC

## 2021-11-04 MED ORDER — VITAMIN D 25 MCG (1000 UNIT) PO TABS: 2000.0000 [IU] | ORAL_TABLET | Freq: Every day | ORAL | Status: DC

## 2021-11-04 MED ORDER — OXYCODONE-ACETAMINOPHEN 10-325 MG PO TABS: 1.0000 | ORAL_TABLET | Freq: Three times a day (TID) | ORAL | Status: DC

## 2021-11-04 MED ORDER — PANTOPRAZOLE SODIUM 40 MG PO TBEC: 40.0000 mg | DELAYED_RELEASE_TABLET | Freq: Two times a day (BID) | ORAL | Status: DC

## 2021-11-04 MED ORDER — GUAIFENESIN 100 MG/5ML PO LIQD
5.0000 mL | Freq: Once | ORAL | Status: AC
  Administered 2021-11-04: 5 mL via ORAL
  Filled 2021-11-04: qty 5

## 2021-11-04 MED ORDER — DIPHENHYDRAMINE HCL 25 MG PO TABS
25.0000 mg | ORAL_TABLET | Freq: Four times a day (QID) | ORAL | Status: DC | PRN
  Filled 2021-11-04: qty 1

## 2021-11-04 MED ORDER — LORATADINE 10 MG PO TABS
10.0000 mg | ORAL_TABLET | Freq: Once | ORAL | Status: AC
  Administered 2021-11-04: 10 mg via ORAL
  Filled 2021-11-04: qty 1

## 2021-11-04 MED ORDER — ALBUTEROL SULFATE HFA 108 (90 BASE) MCG/ACT IN AERS
2.0000 | INHALATION_SPRAY | RESPIRATORY_TRACT | Status: DC | PRN
  Filled 2021-11-04: qty 6.7

## 2021-11-04 MED ORDER — INSULIN ASPART 100 UNIT/ML IJ SOLN: 5.0000 [IU] | Freq: Three times a day (TID) | INTRAMUSCULAR | Status: DC

## 2021-11-04 MED ORDER — IPRATROPIUM-ALBUTEROL 0.5-2.5 (3) MG/3ML IN SOLN: 3.0000 mL | Freq: Four times a day (QID) | RESPIRATORY_TRACT | Status: DC | PRN

## 2021-11-04 MED ORDER — BISACODYL 5 MG PO TBEC: 15.0000 mg | DELAYED_RELEASE_TABLET | Freq: Every day | ORAL | Status: DC | PRN

## 2021-11-04 MED ORDER — PSEUDOEPHEDRINE HCL 60 MG PO TABS
60.0000 mg | ORAL_TABLET | Freq: Once | ORAL | Status: AC
  Administered 2021-11-04: 60 mg via ORAL
  Filled 2021-11-04: qty 1

## 2021-11-04 MED ORDER — MELATONIN 3 MG PO TABS
9.0000 mg | ORAL_TABLET | Freq: Every day | ORAL | Status: DC
  Administered 2021-11-04: 9 mg via ORAL
  Filled 2021-11-04: qty 3

## 2021-11-04 MED ORDER — DULOXETINE HCL 30 MG PO CPEP
60.0000 mg | ORAL_CAPSULE | Freq: Every day | ORAL | Status: DC
Start: 2021-11-04 — End: 2021-11-05

## 2021-11-04 MED ORDER — CYCLOBENZAPRINE HCL 10 MG PO TABS
10.0000 mg | ORAL_TABLET | Freq: Three times a day (TID) | ORAL | Status: DC | PRN
  Administered 2021-11-04: 10 mg via ORAL
  Filled 2021-11-04: qty 1

## 2021-11-04 MED ORDER — ALBUTEROL SULFATE (2.5 MG/3ML) 0.083% IN NEBU
5.0000 mg | INHALATION_SOLUTION | Freq: Once | RESPIRATORY_TRACT | Status: AC
  Administered 2021-11-04: 5 mg via RESPIRATORY_TRACT
  Filled 2021-11-04: qty 6

## 2021-11-04 MED ORDER — NIRMATRELVIR/RITONAVIR (PAXLOVID)TABLET: 3.0000 | ORAL_TABLET | Freq: Two times a day (BID) | ORAL | 0 refills | Status: AC

## 2021-11-04 NOTE — ED Triage Notes (Addendum)
Pt presents to ED via [facility] for shortness of breath for a few days. Pt called EMS herself, was taken to [facility] and waited to unload for over 2 hours before bringing to ED here. Pt was not given any treatment in route. Pt yelling at staff and uncooperative at this time and states "I've been waiting over 2 hours at another hospital. I'm really fit to be tied now." Pt refused chest xray at facility a couple of days ago.

## 2021-11-04 NOTE — ED Notes (Signed)
Pt extremely aggressive, throwing items at nurses head while trying to get meds scanned in. Had to call in a witness to keep myself safe each time I turned my head to use computer. Also pt stated that she hoped my family all died from covid from her. Pt grabbed onto and pinched my left pointer finger while trying to assist her with medication leaving a bruise on it. All of this behavior was witnessed by several staff members

## 2021-11-04 NOTE — ED Notes (Signed)
Patient fussing and yelling at this nurse and refuses to wear a mask, patient states she is not being  treated for her pain. See pain medication ardered and given in [facility]. Patient given diet sprite.

## 2021-11-04 NOTE — ED Notes (Signed)
Patient coughing and Dr. Kennon Holter  and orders at this time. Patient has water at bedside.

## 2021-11-04 NOTE — Discharge Instructions (Addendum)
The testing today shows that you have COVID-19 infection.  Continue taking your usual medications except oxybutynin.  You can resume the oxybutynin after 1 week, following completion of the Paxlovid treatment.  We are prescribing Paxlovid, and antiviral treatment to make you get better quickly and decrease chances of decompensation from the COVID infection.  Get plenty of rest, drink a lot of fluids and avoid cigarette smoking.

## 2021-11-04 NOTE — ED Notes (Signed)
Patient states the pain medication won't do anything for the pain between her choulder blades because she normally takes 10mg  hydrocodone at [facility]. Patient states she is able to transfer herself from wheelchair to bed and vice versa. Patient states she needed to use the bathroom. This nurse asked if she could transfer to bedside commode and patient states the bed is too high and her right hip won't allow it. Patient refused the purewick and bedpan. Patient states 10/10 pain. Patient currently 100% RA and playing candy crush on her phone without signs of restlessness. Only verbal wording of pain.

## 2021-11-04 NOTE — ED Provider Notes (Addendum)
[facility] EMERGENCY DEPARTMENT Provider Note   CSN: [id number] Arrival date & time: 11/04/21  1445     History  Chief Complaint  Patient presents with   Shortness of Breath    Gloria Chapman is a 66 y.o. female.  HPI Patient presents by EMS for evaluation of shortness of breath has been present for years.  She reportedly was first taken to another hospital where she waited to be offloaded but never did, so was brought here.  That is the report that she arrives with by EMS.  I am unable to verify these details.  She currently lives in a nursing care facility.  She typically smokes but states her lungs hurt too much to be able to smoke.    Home Medications Prior to Admission medications   Medication Sig Start Date End Date Taking? Authorizing Provider  nirmatrelvir/ritonavir EUA (PAXLOVID) 20 x 150 MG & 10 x 100MG  TABS Take 3 tablets by mouth 2 (two) times daily for 5 days. Patient GFR is >60. Take nirmatrelvir (150 mg) two tablets twice daily for 5 days and ritonavir (100 mg) one tablet twice daily for 5 days. 11/04/21 11/09/21 Yes Mancel Bale, MD  acetaminophen (TYLENOL) 500 MG tablet Take 1,000 mg by mouth every 6 (six) hours as needed for moderate pain.    [provider]  albuterol (VENTOLIN HFA) 108 (90 Base) MCG/ACT inhaler Inhale 2 puffs into the lungs every 4 (four) hours as needed for wheezing or shortness of breath. 05/30/20   [provider]  aspirin EC 81 MG tablet Take 81 mg by mouth every evening. Swallow whole.    [provider]  benzonatate (TESSALON) 100 MG capsule Take 100 mg by mouth every 6 (six) hours as needed for cough.    [provider]  bisacodyl (DULCOLAX) 5 MG EC tablet Take 15 mg by mouth daily as needed for moderate constipation.    [provider]  Cholecalciferol (VITAMIN D) 50 MCG (2000 UT) tablet Take 2,000 Units by mouth daily.    [provider]  Cranberry 450 MG TABS Take 450 mg by mouth  every 12 (twelve) hours.    [provider]  cyclobenzaprine (FLEXERIL) 10 MG tablet Take 10 mg by mouth every 8 (eight) hours as needed for muscle spasms. 01/07/21   [provider]  diclofenac Sodium (VOLTAREN) 1 % GEL Apply 2 g topically every 12 (twelve) hours as needed (left shoulder pain).    [provider]  diphenhydrAMINE (BENADRYL) 25 MG tablet Take 25 mg by mouth every 6 (six) hours as needed for itching.     [provider]  DULoxetine (CYMBALTA) 60 MG capsule Take 60 mg by mouth daily.    [provider]  furosemide (LASIX) 40 MG tablet Take 40 mg by mouth in the morning.    [provider]  gabapentin (NEURONTIN) 300 MG capsule Take 2 capsules (600 mg total) by mouth 3 (three) times daily. Patient taking differently: Take 300 mg by mouth 2 (two) times daily. 03/17/21   Lazaro Arms, MD  Glucagon HCl 1 MG SOLR Inject 1 mg as directed daily as needed (blood sugar below 60 and unable to take oral glucose replacement).    [provider]  guaiFENesin (ROBITUSSIN) 100 MG/5ML liquid Take 20 mLs by mouth every 4 (four) hours as needed for cough (congestion).    [provider]  insulin aspart (NOVOLOG) 100 UNIT/ML injection Inject 5 Units into the skin 3 (  three) times daily before meals. Hold if blood glucose is below 200    [provider]  insulin glargine (LANTUS) 100 UNIT/ML injection Inject 43 Units into the skin at bedtime.    [provider]  ipratropium-albuterol (DUONEB) 0.5-2.5 (3) MG/3ML SOLN Take 3 mLs by nebulization every 6 (six) hours as needed (shortness of breath, coughing or wheezing).    [provider]  levocetirizine (XYZAL) 5 MG tablet Take 5 mg by mouth daily at 12 noon.    [provider]  LORazepam (ATIVAN) 0.5 MG tablet Take 0.5 mg by mouth every 8 (eight) hours.    [provider]  Melatonin 5 MG TABS Take 10 mg by mouth at bedtime.     [provider]  naloxone [facility](NARCAN) nasal spray 4 mg/0.1 mL Place 1 spray into the nose See admin instructions. Spray 1 spray in nostril every 3 minutes as needed for overdose symptoms    [provider]  ondansetron (ZOFRAN) 4 MG tablet Take 1 tablet (4 mg total) by mouth every 8 (eight) hours as needed for refractory nausea / vomiting. 10/05/21 10/05/22  Lucretia RoersBridges, Lindsay C, MD  oxyCODONE (ROXICODONE) 5 MG immediate release tablet Take 1 tablet (5 mg total) by mouth every 4 (four) hours as needed for breakthrough pain or severe pain. May take in addition to your normal percocet as needed for breakthrough pain. 10/05/21 10/05/22  Lucretia RoersBridges, Lindsay C, MD  oxyCODONE-acetaminophen (PERCOCET) 10-325 MG tablet Take 1 tablet by mouth every 8 (eight) hours.    [provider]  pantoprazole (PROTONIX) 40 MG tablet Take 1 tablet (40 mg total) by mouth 2 (two) times daily before a meal. 06/21/21   Tiffany KocherLewis, Leslie S, PA-C  Polyethyl Glycol-Propyl Glycol (SYSTANE) 0.4-0.3 % SOLN Place 1 drop into both eyes every 12 (twelve) hours as needed (dry eyes).    [provider]  polyethylene glycol (MIRALAX / GLYCOLAX) packet Take 17 g by mouth 2 (two) times daily. Mix and give with 4-8 oz. of fluid    [provider]  potassium chloride (KLOR-CON) 10 MEQ tablet Take 10 mEq by mouth daily. 01/26/20   [provider]  promethazine (PHENERGAN) 12.5 MG tablet Take 1 tablet (12.5 mg total) by mouth every 8 (eight) hours as needed for nausea or vomiting. No more than twice dialy. Patient taking differently: Take 12.5 mg by mouth every 6 (six) hours as needed for nausea or vomiting. 04/26/20   Tiffany KocherLewis, Leslie S, PA-C  Saline 0.2 % SOLN Place 1 spray into the nose every 4 (four) hours as needed (nasal dryness/irritation).     [provider]  senna (SENOKOT) 8.6 MG TABS tablet Take 1 tablet by mouth every 12 (twelve) hours.    [provider]  sitaGLIPtin (JANUVIA) 100 MG tablet  Take 100 mg by mouth daily.    [provider]  Tiotropium Bromide-Olodaterol (STIOLTO RESPIMAT) 2.5-2.5 MCG/ACT AERS Inhale 2 puffs into the lungs daily.    [provider]  traZODone (DESYREL) 100 MG tablet Take 200 mg by mouth at bedtime.    [provider]  traZODone (DESYREL) 50 MG tablet Take 50 mg by mouth daily at 12 noon. For Anxiety    [provider]  witch hazel-glycerin (TUCKS) pad Apply 1 application topically every 6 (six) hours as needed for hemorrhoids.    [provider]      Allergies    Reglan [metoclopramide]    Review of Systems   Review  of Systems  Physical Exam Updated Vital Signs BP 128/70    Pulse 84    Temp 98.7 F (37.1 C) (Oral)    Resp 11    Ht 5\' 2"  (1.575 m)    Wt 96.2 kg    SpO2 96%    BMI 38.78 kg/m  Physical Exam Vitals and nursing note reviewed.  Constitutional:      General: She is not in acute distress.    Appearance: She is well-developed. She is obese. She is not ill-appearing or diaphoretic.  HENT:     Head: Normocephalic and atraumatic.     Right Ear: External ear normal.     Left Ear: External ear normal.  Eyes:     Conjunctiva/sclera: Conjunctivae normal.     Pupils: Pupils are equal, round, and reactive to light.  Neck:     Trachea: Phonation normal.  Cardiovascular:     Rate and Rhythm: Normal rate and regular rhythm.     Heart sounds: Normal heart sounds.  Pulmonary:     Effort: Pulmonary effort is normal.     Comments: Decreased air movement bilaterally.  No audible wheezes rales or rhonchi.  No increased work of breathing.  The respiratory rate is normal. Abdominal:     Palpations: Abdomen is soft.     Tenderness: There is no abdominal tenderness.  Musculoskeletal:        General: Normal range of motion.     Cervical back: Normal range of motion and neck supple.  Skin:    General: Skin is warm and dry.  Neurological:     Mental Status: She is alert and oriented to person,  place, and time.     Cranial Nerves: No cranial nerve deficit.     Sensory: No sensory deficit.     Motor: No abnormal muscle tone.     Coordination: Coordination normal.  Psychiatric:        Mood and Affect: Mood normal.        Behavior: Behavior normal.        Thought Content: Thought content normal.        Judgment: Judgment normal.    ED Results / Procedures / Treatments   Labs (all labs ordered are listed, but only abnormal results are displayed) Labs Reviewed  RESP PANEL BY RT-PCR (FLU A&B, COVID) ARPGX2 - Abnormal; Notable for the following components:      Result Value   SARS Coronavirus 2 by RT PCR POSITIVE (*)    All other components within normal limits  COMPREHENSIVE METABOLIC PANEL - Abnormal; Notable for the following components:   Sodium 133 (*)    Glucose, Bld 142 (*)    All other components within normal limits  CBC WITH DIFFERENTIAL/PLATELET - Abnormal; Notable for the following components:   WBC 13.0 (*)    Neutro Abs 9.2 (*)    All other components within normal limits  BLOOD GAS, VENOUS - Abnormal; Notable for the following components:   pH, Ven 7.46 (*)    pCO2, Ven 42 (*)    Bicarbonate 29.9 (*)    Acid-Base Excess 5.2 (*)    All other components within normal limits    EKG EKG Interpretation  Date/Time:  Saturday November 04 2021 15:04:42 EST Ventricular Rate:  71 PR Interval:  164 QRS Duration: 90 QT Interval:  413 QTC Calculation: 449 R Axis:   56 Text Interpretation: Sinus rhythm Low voltage, extremity and precordial leads Baseline wander in lead(s) V3 since  last tracing no significant change Confirmed by Mancel BaleWentz, Larcenia Holaday [phone] on 11/04/2021 5:07:21 PM  Radiology DG Chest Port 1 View  Result Date: 11/04/2021 CLINICAL DATA:  Shortness of breath. EXAM: PORTABLE CHEST 1 VIEW COMPARISON:  07/08/2021 FINDINGS: Heart size and mediastinal contours are unremarkable. There is no pleural effusion or edema. No airspace opacities. Scar like densities  noted within the lung bases, left greater than right. IMPRESSION: No acute cardiopulmonary abnormalities. Electronically Signed   By: Signa Kellaylor  Stroud M.D.   On: 11/04/2021 15:22    Procedures Procedures    Medications Ordered in ED Medications  albuterol (VENTOLIN HFA) 108 (90 Base) MCG/ACT inhaler 2 puff (has no administration in time range)  aspirin EC tablet 81 mg (has no administration in time range)  benzonatate (TESSALON) capsule 100 mg (has no administration in time range)  bisacodyl (DULCOLAX) EC tablet 15 mg (has no administration in time range)  Vitamin D 2,000 Units (has no administration in time range)  Cranberry TABS 450 mg (has no administration in time range)  cyclobenzaprine (FLEXERIL) tablet 10 mg (has no administration in time range)  diphenhydrAMINE (BENADRYL) tablet 25 mg (has no administration in time range)  DULoxetine (CYMBALTA) DR capsule 60 mg (has no administration in time range)  furosemide (LASIX) tablet 40 mg (has no administration in time range)  gabapentin (NEURONTIN) capsule 600 mg (has no administration in time range)  guaiFENesin (ROBITUSSIN) 100 MG/5ML liquid 20 mL (has no administration in time range)  insulin aspart (novoLOG) injection 5 Units (has no administration in time range)  insulin glargine-yfgn (SEMGLEE) injection 43 Units (has no administration in time range)  ipratropium-albuterol (DUONEB) 0.5-2.5 (3) MG/3ML nebulizer solution 3 mL (has no administration in time range)  LORazepam (ATIVAN) tablet 0.5 mg (has no administration in time range)  melatonin tablet 10 mg (has no administration in time range)  ondansetron (ZOFRAN) tablet 4 mg (has no administration in time range)  oxyCODONE (Oxy IR/ROXICODONE) immediate release tablet 5 mg (has no administration in time range)  oxyCODONE-acetaminophen (PERCOCET) 10-325 MG per tablet 1 tablet (has no administration in time range)  pantoprazole (PROTONIX) EC tablet 40 mg (has no administration in time  range)  potassium chloride SA (KLOR-CON M) CR tablet 10 mEq (has no administration in time range)  promethazine (PHENERGAN) tablet 12.5 mg (has no administration in time range)  traZODone (DESYREL) tablet 200 mg (has no administration in time range)  oxyCODONE-acetaminophen (PERCOCET/ROXICET) 5-325 MG per tablet 1 tablet (1 tablet Oral Given 11/04/21 1617)  albuterol (PROVENTIL) (2.5 MG/3ML) 0.083% nebulizer solution 5 mg (5 mg Nebulization Given 11/04/21 1656)  guaiFENesin (ROBITUSSIN) 100 MG/5ML liquid 5 mL (5 mLs Oral Given 11/04/21 1645)  methylPREDNISolone sodium succinate (SOLU-MEDROL) 125 mg/2 mL injection 125 mg (125 mg Intravenous Given 11/04/21 1655)  oxyCODONE-acetaminophen (PERCOCET/ROXICET) 5-325 MG per tablet 1 tablet (1 tablet Oral Given 11/04/21 1751)  loratadine (CLARITIN) tablet 10 mg (10 mg Oral Given 11/04/21 2016)  pseudoephedrine (SUDAFED) tablet 60 mg (60 mg Oral Given 11/04/21 2015)    ED Course/ Medical Decision Making/ A&P Clinical Course as of 11/04/21 2053  Sat Nov 04, 2021  1505 As I was leaving the patient's room after examining her she requested "pain medicine."  She states that hurt all over.  She reported to me that she does not walk because her right hip was "removed." [EW]  2053 Nursing informs me that the patient may be here overnight, for transport back to her facility.  Routine medications ordered, to help keep  the patient better controlled. [EW]    Clinical Course User Index [EW] Mancel Bale, MD                           Medical Decision Making Problems Addressed: Chronic obstructive pulmonary disease, unspecified COPD type [facility]): chronic illness or injury COVID: acute illness or injury that poses a threat to life or bodily functions Hx of insulin dependent diabetes mellitus: chronic illness or injury Tobacco abuse: chronic illness or injury  Amount and/or Complexity of Data Reviewed Independent Historian:     Details: She is a cogent  historian External Data Reviewed: notes.    Details: Recent laboratory test, as well as umbilical hernia repair and office follow-up with general surgery. Labs: ordered.    Details: CBC, metabolic panel, VBG, viral panel-normal findings except COVID-positive, sodium low, glucose high, white count high, PCO2 high Radiology: ordered and independent interpretation performed.    Details: Chest x-ray-no infiltrate or edema ECG/medicine tests: ordered.    Details: Normal sinus rhythm  Risk OTC drugs. Prescription drug management. Decision regarding hospitalization. Risk Details: Patient presenting with concern for COPD exacerbation, causing cough and malaise.  Vital signs were normal.  She did not require oxygen support.  COVID test positive, indicating that is the cause of her discomfort.  She is at risk for decompensation due to comorbidities.  She requires treatment with Paxlovid, oral antiviral medication.  She does not require hospitalization because she is not in respiratory distress and has normal vital signs.  Will need to hold oxybutynin, while she is on Paxlovid.  She can continue her other medications.  The patient is stable for discharge from the hospital ED, to her nursing home setting.  Critical Care Total time providing critical care: 30-74 minutes          Final Clinical Impression(s) / ED Diagnoses Final diagnoses:  COVID  Chronic obstructive pulmonary disease, unspecified COPD type (HCC)  Hx of insulin dependent diabetes mellitus  Tobacco abuse    Rx / DC Orders ED Discharge Orders          Ordered    nirmatrelvir/ritonavir EUA (PAXLOVID) 20 x 150 MG & 10 x 100MG  TABS  2 times daily        11/04/21 1826              11/06/21, MD 11/04/21 1832  Note completed regarding duration of stay and requirement for additional medications.    11/06/21, MD 11/04/21 [phone]

## 2021-11-14 ENCOUNTER — Ambulatory Visit (HOSPITAL_COMMUNITY): Payer: Medicare Other

## 2021-11-16 ENCOUNTER — Ambulatory Visit: Payer: Medicare Other | Admitting: General Surgery

## 2021-12-08 ENCOUNTER — Other Ambulatory Visit: Payer: Self-pay

## 2021-12-08 ENCOUNTER — Ambulatory Visit (HOSPITAL_COMMUNITY)
Admission: RE | Admit: 2021-12-08 | Discharge: 2021-12-08 | Disposition: A | Discharge status: Home / Self Care | Payer: Medicare Other | Source: Ambulatory Visit | Attending: General Surgery | Admitting: General Surgery

## 2021-12-08 DIAGNOSIS — K429 Umbilical hernia without obstruction or gangrene: Secondary | ICD-10-CM | POA: Insufficient documentation

## 2021-12-08 LAB — POCT I-STAT CREATININE: Creatinine, Ser: 0.7 mg/dL (ref 0.44–1.00)

## 2021-12-08 MED ORDER — IOHEXOL 300 MG/ML  SOLN
100.0000 mL | Freq: Once | INTRAMUSCULAR | Status: AC | PRN
  Administered 2021-12-08: 100 mL via INTRAVENOUS

## 2021-12-11 NOTE — Progress Notes (Signed)
CT scan with small umbilical hernia at the lap chole site at the umbilicus. This can be repaired if she is still hurting, but it only contains fat and no bowel. We would need to see her back before we set her up for repair.

## 2021-12-12 ENCOUNTER — Encounter: Payer: Self-pay | Admitting: *Deleted

## 2021-12-12 ENCOUNTER — Ambulatory Visit (INDEPENDENT_AMBULATORY_CARE_PROVIDER_SITE_OTHER): Payer: Medicare Other | Admitting: General Surgery

## 2021-12-12 ENCOUNTER — Encounter: Payer: Self-pay | Admitting: General Surgery

## 2021-12-12 ENCOUNTER — Other Ambulatory Visit: Payer: Self-pay

## 2021-12-12 VITALS — BP 106/66 | HR 69 | Temp 98.1°F | Resp 14 | Ht 62.0 in | Wt 211.0 lb

## 2021-12-12 DIAGNOSIS — K429 Umbilical hernia without obstruction or gangrene: Secondary | ICD-10-CM

## 2021-12-12 NOTE — Progress Notes (Signed)
[facility] History and Physical ? ?Chief Complaint   ?Follow-up ?  ? ? ?Gloria Chapman is a 66 y.o. female.  ?HPI: Patient known to me after a laparoscopic cholecystectomy and prior umbilical hernia. I had attempted to go through the hernia defect and repair it at the time of the surgery but this has required since that time. She says she feels a lump and is having pain in the area. We obtained a CT to confirm that it was indeed a hernia and not a seroma and there is about a 2.5 cm defect. She wants to proceed with repair. She is otherwise doing ok but is still on chronic pain meds and lives in an assisted living facility.   ? ?Past Medical History:  ?Diagnosis Date  ? Anemia   ? Anxiety   ? Arthritis   ? COPD (chronic obstructive pulmonary disease) ([place])   ? Depression   ? Diabetes mellitus without complication ([place])   ? Dyspnea   ? Fibromyalgia   ? GERD (gastroesophageal reflux disease)   ? Headache   ? Heart failure ([place])   ? Heart murmur   ? Hepatitis   ? History of Clostridium difficile infection   ? History of methicillin resistant staphylococcus aureus (MRSA)   ? Insomnia   ? Overactive bladder   ? Pneumonia   ? PONV (postoperative nausea and vomiting)   ? ? ?Past Surgical History:  ?Procedure Laterality Date  ? APPENDECTOMY    ? BACK SURGERY    ? twice  ? CHOLECYSTECTOMY N/A 09/20/2021  ? Procedure: LAPAROSCOPIC CHOLECYSTECTOMY;  Surgeon: Virl Cagey, MD;  Location: [facility] ORS;  Service: General;  Laterality: N/A;  ? DIAGNOSTIC LAPAROSCOPY    ? checking for endometriosis  ? ESOPHAGOGASTRODUODENOSCOPY (EGD) WITH PROPOFOL N/A 11/21/2017  ? Dr. Gala Romney: Erosive reflux esophagitis, low-grade narrowing Schatzki ring status post dilation, small hiatal hernia.  ? ESOPHAGOGASTRODUODENOSCOPY (EGD) WITH PROPOFOL N/A 07/25/2020  ? Dr. Gala Romney: Oral candidiasis, likely early Candida esophagitis, normal stomach and duodenum.  Esophagus dilated due to history of dysphagia.  ? FEMUR FRACTURE SURGERY Right    ? Rods placed  ? HEMORRHOID SURGERY    ? INNER EAR SURGERY Left   ? MALONEY DILATION N/A 11/21/2017  ? Procedure: MALONEY DILATION;  Surgeon: Daneil Dolin, MD;  Location: [facility] ENDO SUITE;  Service: Endoscopy;  Laterality: N/A;  ? MALONEY DILATION N/A 07/25/2020  ? Procedure: MALONEY DILATION;  Surgeon: Daneil Dolin, MD;  Location: [facility] ENDO SUITE;  Service: Endoscopy;  Laterality: N/A;  ? NASAL SINUS SURGERY    ? right hip surgery    ? [place]: "took my hip out due to MRSA" occurred after hip replacements  ? ? ?Family History  ?Problem Relation Age of Onset  ? Cancer Father   ?     unknown  ? Cancer Mother   ? Colon cancer Neg Hx   ? ? ?Social History  ? ?Tobacco Use  ? Smoking status: Every Day  ?  Packs/day: 2.00  ?  Years: 50.00  ?  Pack years: 100.00  ?  Types: Cigarettes  ? Smokeless tobacco: Never  ? Tobacco comments:  ?  0.5PPD- 11/17/2020  ?Vaping Use  ? Vaping Use: Never used  ?Substance Use Topics  ? Alcohol use: No  ? Drug use: No  ? ? ?Medications: I have reviewed the patient's current medications. ?Allergies as of 12/12/2021   ? ?   Reactions  ? Reglan [metoclopramide]   ?  Tardive dyskinesia per patient  ? ?  ? ?  ?Medication List  ?  ? ?  ? Accurate as of December 12, 2021 10:02 AM. If you have any questions, ask your nurse or doctor.  ?  ?  ? ?  ? ?acetaminophen 500 MG tablet ?Commonly known as: TYLENOL ?Take 1,000 mg by mouth every 6 (six) hours as needed for moderate pain. ?  ?albuterol 108 (90 Base) MCG/ACT inhaler ?Commonly known as: VENTOLIN HFA ?Inhale 2 puffs into the lungs every 4 (four) hours as needed for wheezing or shortness of breath. ?  ?aspirin EC 81 MG tablet ?Take 81 mg by mouth every evening. Swallow whole. ?  ?benzonatate 100 MG capsule ?Commonly known as: TESSALON ?Take 100 mg by mouth every 6 (six) hours as needed for cough. ?  ?bisacodyl 5 MG EC tablet ?Commonly known as: DULCOLAX ?Take 15 mg by mouth daily as needed for moderate constipation. ?  ?Cranberry 450 MG Tabs ?Take 450 mg  by mouth every 12 (twelve) hours. ?  ?cyclobenzaprine 10 MG tablet ?Commonly known as: FLEXERIL ?Take 10 mg by mouth every 8 (eight) hours as needed for muscle spasms. ?  ?diclofenac Sodium 1 % Gel ?Commonly known as: VOLTAREN ?Apply 2 g topically every 12 (twelve) hours as needed (left shoulder pain). ?  ?diphenhydrAMINE 25 MG tablet ?Commonly known as: BENADRYL ?Take 25 mg by mouth every 6 (six) hours as needed for itching. ?  ?DULoxetine 60 MG capsule ?Commonly known as: CYMBALTA ?Take 60 mg by mouth daily. ?  ?furosemide 40 MG tablet ?Commonly known as: LASIX ?Take 40 mg by mouth in the morning. ?  ?gabapentin 300 MG capsule ?Commonly known as: NEURONTIN ?Take 2 capsules (600 mg total) by mouth 3 (three) times daily. ?What changed:  ?how much to take ?when to take this ?  ?Glucagon HCl 1 MG Solr ?Inject 1 mg as directed daily as needed (blood sugar below 60 and unable to take oral glucose replacement). ?  ?guaiFENesin 100 MG/5ML liquid ?Commonly known as: ROBITUSSIN ?Take 20 mLs by mouth every 4 (four) hours as needed for cough (congestion). ?  ?insulin aspart 100 UNIT/ML injection ?Commonly known as: novoLOG ?Inject 5 Units into the skin 3 (three) times daily before meals. Hold if blood glucose is below 200 ?  ?insulin glargine 100 UNIT/ML injection ?Commonly known as: LANTUS ?Inject 43 Units into the skin at bedtime. ?  ?ipratropium-albuterol 0.5-2.5 (3) MG/3ML Soln ?Commonly known as: DUONEB ?Take 3 mLs by nebulization every 6 (six) hours as needed (shortness of breath, coughing or wheezing). ?  ?levocetirizine 5 MG tablet ?Commonly known as: XYZAL ?Take 5 mg by mouth daily at 12 noon. ?  ?LORazepam 0.5 MG tablet ?Commonly known as: ATIVAN ?Take 0.5 mg by mouth every 8 (eight) hours. ?  ?melatonin 5 MG Tabs ?Take 10 mg by mouth at bedtime. ?  ?naloxone 4 MG/0.1ML Liqd nasal spray kit ?Commonly known as: NARCAN ?Place 1 spray into the nose See admin instructions. Spray 1 spray in nostril every 3 minutes as  needed for overdose symptoms ?  ?ondansetron 4 MG tablet ?Commonly known as: Zofran ?Take 1 tablet (4 mg total) by mouth every 8 (eight) hours as needed for refractory nausea / vomiting. ?  ?oxyCODONE 5 MG immediate release tablet ?Commonly known as: Roxicodone ?Take 1 tablet (5 mg total) by mouth every 4 (four) hours as needed for breakthrough pain or severe pain. May take in addition to your normal percocet as needed  for breakthrough pain. ?  ?oxyCODONE-acetaminophen 10-325 MG tablet ?Commonly known as: PERCOCET ?Take 1 tablet by mouth every 8 (eight) hours. ?  ?pantoprazole 40 MG tablet ?Commonly known as: PROTONIX ?Take 1 tablet (40 mg total) by mouth 2 (two) times daily before a meal. ?  ?polyethylene glycol 17 g packet ?Commonly known as: MIRALAX / GLYCOLAX ?Take 17 g by mouth 2 (two) times daily. Mix and give with 4-8 oz. of fluid ?  ?potassium chloride 10 MEQ tablet ?Commonly known as: KLOR-CON M ?Take 10 mEq by mouth daily. ?  ?promethazine 12.5 MG tablet ?Commonly known as: PHENERGAN ?Take 1 tablet (12.5 mg total) by mouth every 8 (eight) hours as needed for nausea or vomiting. No more than twice dialy. ?What changed:  ?when to take this ?additional instructions ?  ?Saline Spray 0.2 % Soln ?Place 1 spray into the nose every 4 (four) hours as needed (nasal dryness/irritation). ?  ?senna 8.6 MG Tabs tablet ?Commonly known as: SENOKOT ?Take 1 tablet by mouth every 12 (twelve) hours. ?  ?sitaGLIPtin 100 MG tablet ?Commonly known as: JANUVIA ?Take 100 mg by mouth daily. ?  ?Stiolto Respimat 2.5-2.5 MCG/ACT Aers ?Generic drug: Tiotropium Bromide-Olodaterol ?Inhale 2 puffs into the lungs daily. ?  ?Systane 0.4-0.3 % Soln ?Generic drug: Polyethyl Glycol-Propyl Glycol ?Place 1 drop into both eyes every 12 (twelve) hours as needed (dry eyes). ?  ?traZODone 100 MG tablet ?Commonly known as: DESYREL ?Take 200 mg by mouth at bedtime. ?  ?traZODone 50 MG tablet ?Commonly known as: DESYREL ?Take 50 mg by mouth daily at  12 noon. For Anxiety ?  ?Vitamin D 50 MCG (2000 UT) tablet ?Take 2,000 Units by mouth daily. ?  ?witch hazel-glycerin pad ?Commonly known as: TUCKS ?Apply 1 application topically every 6 (six) hours as need

## 2021-12-12 NOTE — Patient Instructions (Addendum)
Surgery will be scheduled 01/01/2022.  ? ?Open Hernia Repair, Adult ?Open hernia repair is a surgical procedure to fix a hernia. A hernia occurs when an internal organ or tissue pushes through a weak spot in the muscles along the wall of the abdomen. Hernias commonly occur in the groin and around the belly button. ?Most hernias tend to get worse over time. Often, surgery is done to prevent the hernia from becoming bigger, uncomfortable, or an emergency. Emergency surgery may be needed if contents of the abdomen get stuck in the opening (incarcerated hernia) or if the blood supply gets cut off (strangulated hernia). In an open repair, an incision is made in the abdomen to perform the surgery. ?Tell a health care provider about: ?Any allergies you have. ?All medicines you are taking, including vitamins, herbs, eye drops, creams, and over-the-counter medicines. ?Any problems you or family members have had with anesthetic medicines. ?Any blood or bone disorders you have. ?Any surgeries you have had. ?Any medical conditions you have, including any recent cold or flu (influenza)symptoms. ?Whether you are pregnant or may be pregnant. ?What are the risks? ?Generally, this is a safe procedure. However, problems may occur, including: ?Long-lasting (chronic) pain. ?Bleeding. ?Infection. ?Damage to the testicles. This can cause shrinking or swelling. ?Damage to nearby structures or organs, including the bladder, blood vessels, intestines, or nerves near the hernia. ?Blood clots. ?Trouble passing urine. ?Return of the hernia. ?What happens before the procedure? ?Medicines ?Ask your health care provider about: ?Changing or stopping your regular medicines. This is especially important if you are taking diabetes medicines or blood thinners. ?Taking medicines such as aspirin and ibuprofen. These medicines can thin your blood. Do not take these medicines unless your health care provider tells you to take them. ?Taking over-the-counter  medicines, vitamins, herbs, and supplements. ?Surgery safety ?Ask your health care provider: ?How your surgery site will be marked. ?What steps will be taken to help prevent infection. These steps may include: ?Removing hair at the surgery site. ?Washing skin with a germ-killing soap. ?Receiving antibiotic medicine. ?General instructions ?You may have an exam or testing, such as blood tests or imaging studies. ?Do not use any products that contain nicotine or tobacco for at least 4 weeks before the procedure. These products include cigarettes, chewing tobacco, and vaping devices, such as e-cigarettes. If you need help quitting, ask your health care provider. ?Let your health care provider know if you develop a cold or any infection before your surgery. If you get an infection before surgery, you may receive antibiotics to treat it. ?Plan to have a responsible adult take you home from the hospital or clinic. ?If you will be going home right after the procedure, plan to have a responsible adult care for you for the time you are told. This is important. ?What happens during the procedure? ? ?An IV will be inserted into one of your veins. ?You will be given one or more of the following: ?A medicine to help you relax (sedative). ?A medicine to numb the area (local anesthetic). ?A medicine to make you fall asleep (general anesthetic). ?Your surgeon will make an incision over the hernia. ?The tissues of the hernia will be moved back into place. ?The edges of the hernia may be stitched (sutured) together. ?The opening in the abdominal muscles will be closed with stitches (sutures). Or, your surgeon will place a mesh patch made of artificial (synthetic) material over the opening. ?The incision will be closed with sutures, skin glue, or  adhesive strips. ?A bandage (dressing) may be placed over the incision. ?The procedure may vary among health care providers and hospitals. ?What happens after the procedure? ?Your blood  pressure, heart rate, breathing rate, and blood oxygen level will be monitored until you leave the hospital or clinic. ?You may be given medicine for pain. ?If you were given a sedative during the procedure, it can affect you for several hours. Do not drive or operate machinery until your health care provider says that it is safe. ?Summary ?Open hernia repair is a surgical procedure to fix a hernia. Hernias commonly occur in the groin and around the belly button. ?Emergency surgery may be needed if contents of the abdomen get stuck in the opening (incarcerated hernia) or if the blood supply gets cut off (strangulated hernia). ?In this procedure, an incision is made in the abdomen to perform the surgery. ?After the procedure, you may be given medicine for pain. ?This information is not intended to replace advice given to you by your health care provider. Make sure you discuss any questions you have with your health care provider. ?Document Revised: 04/18/2020 Document Reviewed: 04/18/2020 ?Elsevier Patient Education ? 2022 [employer]vier Inc[employer] ? ?

## 2021-12-15 NOTE — H&P (Signed)
[facility] History and Physical ? ?Chief Complaint   ?Follow-up ?  ? ? ?Gloria Chapman is a 66 y.o. female.  ?HPI: Patient known to me after a laparoscopic cholecystectomy and prior umbilical hernia. I had attempted to go through the hernia defect and repair it at the time of the surgery but this has required since that time. She says she feels a lump and is having pain in the area. We obtained a CT to confirm that it was indeed a hernia and not a seroma and there is about a 2.5 cm defect. She wants to proceed with repair. She is otherwise doing ok but is still on chronic pain meds and lives in an assisted living facility.   ? ?Past Medical History:  ?Diagnosis Date  ? Anemia   ? Anxiety   ? Arthritis   ? COPD (chronic obstructive pulmonary disease) ([place])   ? Depression   ? Diabetes mellitus without complication ([place])   ? Dyspnea   ? Fibromyalgia   ? GERD (gastroesophageal reflux disease)   ? Headache   ? Heart failure ([place])   ? Heart murmur   ? Hepatitis   ? History of Clostridium difficile infection   ? History of methicillin resistant staphylococcus aureus (MRSA)   ? Insomnia   ? Overactive bladder   ? Pneumonia   ? PONV (postoperative nausea and vomiting)   ? ? ?Past Surgical History:  ?Procedure Laterality Date  ? APPENDECTOMY    ? BACK SURGERY    ? twice  ? CHOLECYSTECTOMY N/A 09/20/2021  ? Procedure: LAPAROSCOPIC CHOLECYSTECTOMY;  Surgeon: Virl Cagey, MD;  Location: [facility];  Service: General;  Laterality: N/A;  ? DIAGNOSTIC LAPAROSCOPY    ? checking for endometriosis  ? ESOPHAGOGASTRODUODENOSCOPY (EGD) WITH PROPOFOL N/A 11/21/2017  ? Dr. Gala Romney: Erosive reflux esophagitis, low-grade narrowing Schatzki ring status post dilation, small hiatal hernia.  ? ESOPHAGOGASTRODUODENOSCOPY (EGD) WITH PROPOFOL N/A 07/25/2020  ? Dr. Gala Romney: Oral candidiasis, likely early Candida esophagitis, normal stomach and duodenum.  Esophagus dilated due to history of dysphagia.  ? FEMUR FRACTURE SURGERY Right    ? Rods placed  ? HEMORRHOID SURGERY    ? INNER EAR SURGERY Left   ? MALONEY DILATION N/A 11/21/2017  ? Procedure: MALONEY DILATION;  Surgeon: Daneil Dolin, MD;  Location: [facility];  Service: Endoscopy;  Laterality: N/A;  ? MALONEY DILATION N/A 07/25/2020  ? Procedure: MALONEY DILATION;  Surgeon: Daneil Dolin, MD;  Location: [facility];  Service: Endoscopy;  Laterality: N/A;  ? NASAL SINUS SURGERY    ? right hip surgery    ? [place]: "took my hip out due to MRSA" occurred after hip replacements  ? ? ?Family History  ?Problem Relation Age of Onset  ? Cancer Father   ?     unknown  ? Cancer Mother   ? Colon cancer Neg Hx   ? ? ?Social History  ? ?Tobacco Use  ? Smoking status: Every Day  ?  Packs/day: 2.00  ?  Years: 50.00  ?  Pack years: 100.00  ?  Types: Cigarettes  ? Smokeless tobacco: Never  ? Tobacco comments:  ?  0.5PPD- 11/17/2020  ?Vaping Use  ? Vaping Use: Never used  ?Substance Use Topics  ? Alcohol use: No  ? Drug use: No  ? ? ?Medications: I have reviewed the patient's current medications. ?Allergies as of 12/12/2021   ? ?   Reactions  ? Reglan [metoclopramide]   ?  Tardive dyskinesia per patient  ? ?  ? ?  ?Medication List  ?  ? ?  ? Accurate as of December 12, 2021 10:02 AM. If you have any questions, ask your nurse or doctor.  ?  ?  ? ?  ? ?acetaminophen 500 MG tablet ?Commonly known as: TYLENOL ?Take 1,000 mg by mouth every 6 (six) hours as needed for moderate pain. ?  ?albuterol 108 (90 Base) MCG/ACT inhaler ?Commonly known as: VENTOLIN HFA ?Inhale 2 puffs into the lungs every 4 (four) hours as needed for wheezing or shortness of breath. ?  ?aspirin EC 81 MG tablet ?Take 81 mg by mouth every evening. Swallow whole. ?  ?benzonatate 100 MG capsule ?Commonly known as: TESSALON ?Take 100 mg by mouth every 6 (six) hours as needed for cough. ?  ?bisacodyl 5 MG EC tablet ?Commonly known as: DULCOLAX ?Take 15 mg by mouth daily as needed for moderate constipation. ?  ?Cranberry 450 MG Tabs ?Take 450 mg  by mouth every 12 (twelve) hours. ?  ?cyclobenzaprine 10 MG tablet ?Commonly known as: FLEXERIL ?Take 10 mg by mouth every 8 (eight) hours as needed for muscle spasms. ?  ?diclofenac Sodium 1 % Gel ?Commonly known as: VOLTAREN ?Apply 2 g topically every 12 (twelve) hours as needed (left shoulder pain). ?  ?diphenhydrAMINE 25 MG tablet ?Commonly known as: BENADRYL ?Take 25 mg by mouth every 6 (six) hours as needed for itching. ?  ?DULoxetine 60 MG capsule ?Commonly known as: CYMBALTA ?Take 60 mg by mouth daily. ?  ?furosemide 40 MG tablet ?Commonly known as: LASIX ?Take 40 mg by mouth in the morning. ?  ?gabapentin 300 MG capsule ?Commonly known as: NEURONTIN ?Take 2 capsules (600 mg total) by mouth 3 (three) times daily. ?What changed:  ?how much to take ?when to take this ?  ?Glucagon HCl 1 MG Solr ?Inject 1 mg as directed daily as needed (blood sugar below 60 and unable to take oral glucose replacement). ?  ?guaiFENesin 100 MG/5ML liquid ?Commonly known as: ROBITUSSIN ?Take 20 mLs by mouth every 4 (four) hours as needed for cough (congestion). ?  ?insulin aspart 100 UNIT/ML injection ?Commonly known as: novoLOG ?Inject 5 Units into the skin 3 (three) times daily before meals. Hold if blood glucose is below 200 ?  ?insulin glargine 100 UNIT/ML injection ?Commonly known as: LANTUS ?Inject 43 Units into the skin at bedtime. ?  ?ipratropium-albuterol 0.5-2.5 (3) MG/3ML Soln ?Commonly known as: DUONEB ?Take 3 mLs by nebulization every 6 (six) hours as needed (shortness of breath, coughing or wheezing). ?  ?levocetirizine 5 MG tablet ?Commonly known as: XYZAL ?Take 5 mg by mouth daily at 12 noon. ?  ?LORazepam 0.5 MG tablet ?Commonly known as: ATIVAN ?Take 0.5 mg by mouth every 8 (eight) hours. ?  ?melatonin 5 MG Tabs ?Take 10 mg by mouth at bedtime. ?  ?naloxone 4 MG/0.1ML Liqd nasal spray kit ?Commonly known as: NARCAN ?Place 1 spray into the nose See admin instructions. Spray 1 spray in nostril every 3 minutes as  needed for overdose symptoms ?  ?ondansetron 4 MG tablet ?Commonly known as: Zofran ?Take 1 tablet (4 mg total) by mouth every 8 (eight) hours as needed for refractory nausea / vomiting. ?  ?oxyCODONE 5 MG immediate release tablet ?Commonly known as: Roxicodone ?Take 1 tablet (5 mg total) by mouth every 4 (four) hours as needed for breakthrough pain or severe pain. May take in addition to your normal percocet as needed  for breakthrough pain. ?  ?oxyCODONE-acetaminophen 10-325 MG tablet ?Commonly known as: PERCOCET ?Take 1 tablet by mouth every 8 (eight) hours. ?  ?pantoprazole 40 MG tablet ?Commonly known as: PROTONIX ?Take 1 tablet (40 mg total) by mouth 2 (two) times daily before a meal. ?  ?polyethylene glycol 17 g packet ?Commonly known as: MIRALAX / GLYCOLAX ?Take 17 g by mouth 2 (two) times daily. Mix and give with 4-8 oz. of fluid ?  ?potassium chloride 10 MEQ tablet ?Commonly known as: KLOR-CON M ?Take 10 mEq by mouth daily. ?  ?promethazine 12.5 MG tablet ?Commonly known as: PHENERGAN ?Take 1 tablet (12.5 mg total) by mouth every 8 (eight) hours as needed for nausea or vomiting. No more than twice dialy. ?What changed:  ?when to take this ?additional instructions ?  ?Saline Spray 0.2 % Soln ?Place 1 spray into the nose every 4 (four) hours as needed (nasal dryness/irritation). ?  ?senna 8.6 MG Tabs tablet ?Commonly known as: SENOKOT ?Take 1 tablet by mouth every 12 (twelve) hours. ?  ?sitaGLIPtin 100 MG tablet ?Commonly known as: JANUVIA ?Take 100 mg by mouth daily. ?  ?Stiolto Respimat 2.5-2.5 MCG/ACT Aers ?Generic drug: Tiotropium Bromide-Olodaterol ?Inhale 2 puffs into the lungs daily. ?  ?Systane 0.4-0.3 % Soln ?Generic drug: Polyethyl Glycol-Propyl Glycol ?Place 1 drop into both eyes every 12 (twelve) hours as needed (dry eyes). ?  ?traZODone 100 MG tablet ?Commonly known as: DESYREL ?Take 200 mg by mouth at bedtime. ?  ?traZODone 50 MG tablet ?Commonly known as: DESYREL ?Take 50 mg by mouth daily at  12 noon. For Anxiety ?  ?Vitamin D 50 MCG (2000 UT) tablet ?Take 2,000 Units by mouth daily. ?  ?witch hazel-glycerin pad ?Commonly known as: TUCKS ?Apply 1 application topically every 6 (six) hours as need

## 2021-12-27 NOTE — Patient Instructions (Signed)
? ?   Alveria Apley ? 12/27/2021  ?  ? @PREFPERIOPPHARMACY @ ? ? Your procedure is scheduled on  01/01/2022. ? ? Report to 01/03/2022 at  0830 A.M. ? ? Call this number if you have problems the morning of surgery: ? [phone] ? ? Remember: ? Do not eat or drink after midnight. ?  ? ?Take 21.5 units of your night time insulin the night before your procedure. ? ?DO NOT take any medications for diabetes the morning of your procedure. ? ?Use you nebulizer and your inhalers before you come and bring your rescue inhaler with you. ? ? ?  ? Take these medicines the morning of surgery with A SIP OF WATER  ? ? ?flexeril(if needed),cymbalta, gabapentin, ativan(if needed), zofran(if needed), roxicodone or percocet(if needed), protonix. ?  ? ? ? Do not wear jewelry, make-up or nail polish. ? Do not wear lotions, powders, or perfumes, or deodorant. ? Do not shave 48 hours prior to surgery.  Men may shave face and neck. ? Do not bring valuables to the hospital. ? [facility] is not responsible for any belongings or valuables. ? ?Contacts, dentures or bridgework may not be worn into surgery.  Leave your suitcase in the car.  After surgery it may be brought to your room. ? ?For patients admitted to the hospital, discharge time will be determined by your treatment team. ? ?Patients discharged the day of surgery will not be allowed to drive home.  ? ? ?Special instructions:   DO NOT smoke tobacco or vape for 24 hours before your procedure. ? ?Please read over the following fact sheets that you were given. ?Anesthesia Post-op Instructions and Care and Recovery After Surgery ?  ? ? ?  ?

## 2021-12-29 ENCOUNTER — Encounter (HOSPITAL_COMMUNITY)
Admission: RE | Admit: 2021-12-29 | Discharge: 2021-12-29 | Disposition: A | Discharge status: Home / Self Care | Payer: Medicare Other | Source: Ambulatory Visit | Attending: General Surgery | Admitting: General Surgery

## 2021-12-29 ENCOUNTER — Encounter (HOSPITAL_COMMUNITY): Payer: Self-pay

## 2022-01-01 ENCOUNTER — Observation Stay (HOSPITAL_COMMUNITY)
Admission: RE | Admit: 2022-01-01 | Discharge: 2022-01-02 | Disposition: A | Discharge status: Home / Self Care | Payer: Medicare Other | Source: Ambulatory Visit | Attending: General Surgery | Admitting: General Surgery

## 2022-01-01 ENCOUNTER — Encounter: Payer: Self-pay | Admitting: Internal Medicine

## 2022-01-01 ENCOUNTER — Encounter (HOSPITAL_COMMUNITY): Payer: Self-pay | Admitting: General Surgery

## 2022-01-01 ENCOUNTER — Ambulatory Visit (HOSPITAL_COMMUNITY): Payer: Medicare Other | Admitting: Anesthesiology

## 2022-01-01 ENCOUNTER — Encounter (HOSPITAL_COMMUNITY)
Admission: RE | Disposition: A | Discharge status: Home / Self Care | Payer: Self-pay | Source: Ambulatory Visit | Attending: General Surgery

## 2022-01-01 ENCOUNTER — Ambulatory Visit (HOSPITAL_BASED_OUTPATIENT_CLINIC_OR_DEPARTMENT_OTHER): Payer: Medicare Other | Admitting: Anesthesiology

## 2022-01-01 ENCOUNTER — Other Ambulatory Visit: Payer: Self-pay

## 2022-01-01 DIAGNOSIS — E119 Type 2 diabetes mellitus without complications: Secondary | ICD-10-CM

## 2022-01-01 DIAGNOSIS — K219 Gastro-esophageal reflux disease without esophagitis: Secondary | ICD-10-CM | POA: Diagnosis not present

## 2022-01-01 DIAGNOSIS — F17210 Nicotine dependence, cigarettes, uncomplicated: Secondary | ICD-10-CM | POA: Diagnosis not present

## 2022-01-01 DIAGNOSIS — F418 Other specified anxiety disorders: Secondary | ICD-10-CM | POA: Diagnosis not present

## 2022-01-01 DIAGNOSIS — I509 Heart failure, unspecified: Secondary | ICD-10-CM | POA: Diagnosis not present

## 2022-01-01 DIAGNOSIS — K429 Umbilical hernia without obstruction or gangrene: Principal | ICD-10-CM | POA: Diagnosis present

## 2022-01-01 DIAGNOSIS — Z794 Long term (current) use of insulin: Secondary | ICD-10-CM | POA: Insufficient documentation

## 2022-01-01 DIAGNOSIS — Z7982 Long term (current) use of aspirin: Secondary | ICD-10-CM | POA: Insufficient documentation

## 2022-01-01 DIAGNOSIS — I10 Essential (primary) hypertension: Secondary | ICD-10-CM | POA: Diagnosis not present

## 2022-01-01 DIAGNOSIS — J449 Chronic obstructive pulmonary disease, unspecified: Secondary | ICD-10-CM | POA: Diagnosis not present

## 2022-01-01 DIAGNOSIS — Z79899 Other long term (current) drug therapy: Secondary | ICD-10-CM | POA: Diagnosis not present

## 2022-01-01 DIAGNOSIS — K8020 Calculus of gallbladder without cholecystitis without obstruction: Secondary | ICD-10-CM | POA: Diagnosis present

## 2022-01-01 HISTORY — PX: UMBILICAL HERNIA REPAIR: SHX196

## 2022-01-01 LAB — GLUCOSE, CAPILLARY
Glucose-Capillary: 208 mg/dL — ABNORMAL HIGH (ref 70–99)
Glucose-Capillary: 176 mg/dL — ABNORMAL HIGH (ref 70–99)
Glucose-Capillary: 366 mg/dL — ABNORMAL HIGH (ref 70–99)

## 2022-01-01 SURGERY — REPAIR, HERNIA, UMBILICAL, ADULT
Anesthesia: General | Site: Abdomen

## 2022-01-01 MED ORDER — ONDANSETRON HCL 4 MG/2ML IJ SOLN
INTRAMUSCULAR | Status: AC
  Filled 2022-01-01: qty 2

## 2022-01-01 MED ORDER — OXYCODONE-ACETAMINOPHEN 10-325 MG PO TABS: 1.0000 | ORAL_TABLET | Freq: Three times a day (TID) | ORAL | Status: DC

## 2022-01-01 MED ORDER — GUAIFENESIN ER 600 MG PO TB12
600.0000 mg | ORAL_TABLET | Freq: Two times a day (BID) | ORAL | Status: DC
  Administered 2022-01-01 – 2022-01-02 (×3): 600 mg via ORAL
  Filled 2022-01-01 (×5): qty 1

## 2022-01-01 MED ORDER — DULOXETINE HCL 60 MG PO CPEP
60.0000 mg | ORAL_CAPSULE | Freq: Every day | ORAL | Status: DC
  Administered 2022-01-02: 60 mg via ORAL
  Filled 2022-01-01: qty 1

## 2022-01-01 MED ORDER — LIDOCAINE HCL (CARDIAC) PF 50 MG/5ML IV SOSY
PREFILLED_SYRINGE | INTRAVENOUS | Status: DC | PRN
  Administered 2022-01-01: 100 mg via INTRAVENOUS

## 2022-01-01 MED ORDER — CEFAZOLIN SODIUM-DEXTROSE 2-4 GM/100ML-% IV SOLN
INTRAVENOUS | Status: AC
  Filled 2022-01-01: qty 100

## 2022-01-01 MED ORDER — CHLORHEXIDINE GLUCONATE CLOTH 2 % EX PADS: 6.0000 | MEDICATED_PAD | Freq: Once | CUTANEOUS | Status: DC

## 2022-01-01 MED ORDER — METOPROLOL TARTRATE 5 MG/5ML IV SOLN: 5.0000 mg | Freq: Four times a day (QID) | INTRAVENOUS | Status: DC | PRN

## 2022-01-01 MED ORDER — OXYCODONE HCL 5 MG PO TABS
5.0000 mg | ORAL_TABLET | Freq: Three times a day (TID) | ORAL | Status: DC
  Administered 2022-01-01 – 2022-01-02 (×4): 5 mg via ORAL
  Filled 2022-01-01 (×4): qty 1

## 2022-01-01 MED ORDER — ASPIRIN EC 81 MG PO TBEC: 81.0000 mg | DELAYED_RELEASE_TABLET | Freq: Every evening | ORAL | Status: DC

## 2022-01-01 MED ORDER — FENTANYL CITRATE (PF) 100 MCG/2ML IJ SOLN
INTRAMUSCULAR | Status: DC | PRN
  Administered 2022-01-01: 100 ug via INTRAVENOUS

## 2022-01-01 MED ORDER — FENTANYL CITRATE PF 50 MCG/ML IJ SOSY
50.0000 ug | PREFILLED_SYRINGE | Freq: Once | INTRAMUSCULAR | Status: AC
  Administered 2022-01-01: 50 ug via INTRAVENOUS

## 2022-01-01 MED ORDER — MIDAZOLAM HCL 2 MG/2ML IJ SOLN
INTRAMUSCULAR | Status: AC
  Filled 2022-01-01: qty 2

## 2022-01-01 MED ORDER — SIMETHICONE 80 MG PO CHEW: 40.0000 mg | CHEWABLE_TABLET | Freq: Four times a day (QID) | ORAL | Status: DC | PRN

## 2022-01-01 MED ORDER — PANTOPRAZOLE SODIUM 40 MG PO TBEC
40.0000 mg | DELAYED_RELEASE_TABLET | Freq: Two times a day (BID) | ORAL | Status: DC
  Administered 2022-01-01 – 2022-01-02 (×2): 40 mg via ORAL
  Filled 2022-01-01 (×2): qty 1

## 2022-01-01 MED ORDER — LIDOCAINE HCL (PF) 2 % IJ SOLN
INTRAMUSCULAR | Status: AC
Start: 2022-01-01 — End: ?
  Filled 2022-01-01: qty 5

## 2022-01-01 MED ORDER — ROCURONIUM BROMIDE 100 MG/10ML IV SOLN
INTRAVENOUS | Status: DC | PRN
  Administered 2022-01-01: 50 mg via INTRAVENOUS

## 2022-01-01 MED ORDER — ALOGLIPTIN BENZOATE 25 MG PO TABS: 25.0000 mg | ORAL_TABLET | Freq: Every day | ORAL | Status: DC

## 2022-01-01 MED ORDER — MORPHINE SULFATE (PF) 2 MG/ML IV SOLN
2.0000 mg | INTRAVENOUS | Status: DC | PRN
  Administered 2022-01-01 – 2022-01-02 (×7): 2 mg via INTRAVENOUS
  Filled 2022-01-01 (×7): qty 1

## 2022-01-01 MED ORDER — MIDAZOLAM HCL 2 MG/2ML IJ SOLN
INTRAMUSCULAR | Status: DC | PRN
  Administered 2022-01-01: 2 mg via INTRAVENOUS

## 2022-01-01 MED ORDER — ALBUTEROL SULFATE (2.5 MG/3ML) 0.083% IN NEBU: 2.5000 mg | INHALATION_SOLUTION | RESPIRATORY_TRACT | Status: DC | PRN

## 2022-01-01 MED ORDER — FENTANYL CITRATE (PF) 100 MCG/2ML IJ SOLN
INTRAMUSCULAR | Status: AC
  Filled 2022-01-01: qty 2

## 2022-01-01 MED ORDER — CRANBERRY 450 MG PO TABS: 450.0000 mg | ORAL_TABLET | Freq: Two times a day (BID) | ORAL | Status: DC

## 2022-01-01 MED ORDER — CEFAZOLIN SODIUM-DEXTROSE 2-4 GM/100ML-% IV SOLN
2.0000 g | INTRAVENOUS | Status: AC
  Administered 2022-01-01: 2 g via INTRAVENOUS

## 2022-01-01 MED ORDER — LIDOCAINE 5 % EX PTCH: 2.0000 | MEDICATED_PATCH | CUTANEOUS | Status: DC

## 2022-01-01 MED ORDER — ALBUTEROL SULFATE HFA 108 (90 BASE) MCG/ACT IN AERS: 2.0000 | INHALATION_SPRAY | RESPIRATORY_TRACT | Status: DC | PRN

## 2022-01-01 MED ORDER — LEVOCETIRIZINE DIHYDROCHLORIDE 5 MG PO TABS: 5.0000 mg | ORAL_TABLET | Freq: Every day | ORAL | Status: DC

## 2022-01-01 MED ORDER — LORATADINE 10 MG PO TABS
10.0000 mg | ORAL_TABLET | Freq: Every day | ORAL | Status: DC
  Administered 2022-01-01 – 2022-01-02 (×2): 10 mg via ORAL
  Filled 2022-01-01 (×2): qty 1

## 2022-01-01 MED ORDER — FLUTICASONE PROPIONATE 50 MCG/ACT NA SUSP: 1.0000 | Freq: Every day | NASAL | Status: DC

## 2022-01-01 MED ORDER — BUPIVACAINE LIPOSOME 1.3 % IJ SUSP
INTRAMUSCULAR | Status: AC
  Filled 2022-01-01: qty 20

## 2022-01-01 MED ORDER — FUROSEMIDE 40 MG PO TABS
40.0000 mg | ORAL_TABLET | Freq: Every morning | ORAL | Status: DC
  Administered 2022-01-02: 40 mg via ORAL
  Filled 2022-01-01: qty 1

## 2022-01-01 MED ORDER — INSULIN GLARGINE-YFGN 100 UNIT/ML ~~LOC~~ SOLN
21.0000 [IU] | Freq: Every day | SUBCUTANEOUS | Status: DC
Start: 2022-01-01 — End: 2022-01-02
  Administered 2022-01-01: 21 [IU] via SUBCUTANEOUS
  Filled 2022-01-01 (×2): qty 0.21

## 2022-01-01 MED ORDER — LORAZEPAM 0.5 MG PO TABS
0.5000 mg | ORAL_TABLET | Freq: Three times a day (TID) | ORAL | Status: DC
  Administered 2022-01-01 – 2022-01-02 (×4): 0.5 mg via ORAL
  Filled 2022-01-01 (×7): qty 1

## 2022-01-01 MED ORDER — LIDOCAINE 5 % EX PTCH
2.0000 | MEDICATED_PATCH | CUTANEOUS | Status: DC
  Administered 2022-01-01: 2 via TRANSDERMAL
  Filled 2022-01-01: qty 2

## 2022-01-01 MED ORDER — SUFENTANIL CITRATE 50 MCG/ML IV SOLN
INTRAVENOUS | Status: DC | PRN
Start: 2022-01-01 — End: 2022-01-01
  Administered 2022-01-01 (×4): 10 ug via INTRAVENOUS

## 2022-01-01 MED ORDER — FENTANYL CITRATE PF 50 MCG/ML IJ SOSY
PREFILLED_SYRINGE | INTRAMUSCULAR | Status: AC
Start: 2022-01-01 — End: ?
  Filled 2022-01-01: qty 1

## 2022-01-01 MED ORDER — SUCCINYLCHOLINE CHLORIDE 200 MG/10ML IV SOSY
PREFILLED_SYRINGE | INTRAVENOUS | Status: AC
  Filled 2022-01-01: qty 10

## 2022-01-01 MED ORDER — ARFORMOTEROL TARTRATE 15 MCG/2ML IN NEBU
15.0000 ug | INHALATION_SOLUTION | Freq: Two times a day (BID) | RESPIRATORY_TRACT | Status: DC
Start: 2022-01-01 — End: 2022-01-02
  Administered 2022-01-01: 15 ug via RESPIRATORY_TRACT
  Filled 2022-01-01: qty 2

## 2022-01-01 MED ORDER — PROPOFOL 10 MG/ML IV BOLUS
INTRAVENOUS | Status: AC
Start: 2022-01-01 — End: ?
  Filled 2022-01-01: qty 20

## 2022-01-01 MED ORDER — LACTATED RINGERS IV SOLN: INTRAVENOUS | Status: DC

## 2022-01-01 MED ORDER — GABAPENTIN 300 MG PO CAPS
300.0000 mg | ORAL_CAPSULE | Freq: Two times a day (BID) | ORAL | Status: DC
  Administered 2022-01-01 – 2022-01-02 (×2): 300 mg via ORAL
  Filled 2022-01-01 (×2): qty 1

## 2022-01-01 MED ORDER — ONDANSETRON HCL 4 MG/2ML IJ SOLN
4.0000 mg | Freq: Once | INTRAMUSCULAR | Status: AC
  Administered 2022-01-01: 4 mg via INTRAVENOUS

## 2022-01-01 MED ORDER — OXYBUTYNIN CHLORIDE ER 5 MG PO TB24
10.0000 mg | ORAL_TABLET | Freq: Every day | ORAL | Status: DC | PRN
Start: 2022-01-01 — End: 2022-01-02

## 2022-01-01 MED ORDER — IPRATROPIUM-ALBUTEROL 0.5-2.5 (3) MG/3ML IN SOLN: 3.0000 mL | Freq: Four times a day (QID) | RESPIRATORY_TRACT | Status: DC | PRN

## 2022-01-01 MED ORDER — SODIUM CHLORIDE (PF) 0.9 % IJ SOLN
INTRAMUSCULAR | Status: AC
  Filled 2022-01-01: qty 10

## 2022-01-01 MED ORDER — ACETAMINOPHEN 325 MG PO TABS: 650.0000 mg | ORAL_TABLET | Freq: Three times a day (TID) | ORAL | Status: DC | PRN

## 2022-01-01 MED ORDER — ACETAMINOPHEN 500 MG PO TABS
1000.0000 mg | ORAL_TABLET | Freq: Four times a day (QID) | ORAL | Status: DC
  Administered 2022-01-01 – 2022-01-02 (×3): 1000 mg via ORAL
  Filled 2022-01-01 (×3): qty 2

## 2022-01-01 MED ORDER — QUETIAPINE FUMARATE 100 MG PO TABS
100.0000 mg | ORAL_TABLET | Freq: Every day | ORAL | Status: DC
  Administered 2022-01-01: 100 mg via ORAL
  Filled 2022-01-01: qty 1

## 2022-01-01 MED ORDER — OXYCODONE-ACETAMINOPHEN 5-325 MG PO TABS
1.0000 | ORAL_TABLET | Freq: Three times a day (TID) | ORAL | Status: DC
  Administered 2022-01-01 – 2022-01-02 (×4): 1 via ORAL
  Filled 2022-01-01 (×4): qty 1

## 2022-01-01 MED ORDER — ONDANSETRON HCL 4 MG/2ML IJ SOLN
4.0000 mg | Freq: Four times a day (QID) | INTRAMUSCULAR | Status: DC | PRN
  Administered 2022-01-01 – 2022-01-02 (×2): 4 mg via INTRAVENOUS
  Filled 2022-01-01 (×2): qty 2

## 2022-01-01 MED ORDER — MONTELUKAST SODIUM 10 MG PO TABS
10.0000 mg | ORAL_TABLET | Freq: Every day | ORAL | Status: DC
  Administered 2022-01-01: 10 mg via ORAL
  Filled 2022-01-01: qty 1

## 2022-01-01 MED ORDER — FENTANYL CITRATE PF 50 MCG/ML IJ SOSY
25.0000 ug | PREFILLED_SYRINGE | INTRAMUSCULAR | Status: DC | PRN
  Administered 2022-01-01 (×4): 50 ug via INTRAVENOUS
  Filled 2022-01-01 (×3): qty 1

## 2022-01-01 MED ORDER — INSULIN ASPART 100 UNIT/ML IJ SOLN
5.0000 [IU] | Freq: Three times a day (TID) | INTRAMUSCULAR | Status: DC
  Administered 2022-01-02 (×2): 5 [IU] via SUBCUTANEOUS

## 2022-01-01 MED ORDER — PROPOFOL 10 MG/ML IV BOLUS
INTRAVENOUS | Status: DC | PRN
  Administered 2022-01-01: 50 mg via INTRAVENOUS
  Administered 2022-01-01: 150 mg via INTRAVENOUS

## 2022-01-01 MED ORDER — POLYETHYLENE GLYCOL 3350 17 G PO PACK
17.0000 g | PACK | Freq: Two times a day (BID) | ORAL | Status: DC
  Administered 2022-01-02: 17 g via ORAL
  Filled 2022-01-01: qty 1

## 2022-01-01 MED ORDER — CHLORHEXIDINE GLUCONATE 0.12 % MT SOLN: 15.0000 mL | Freq: Once | OROMUCOSAL | Status: DC

## 2022-01-01 MED ORDER — ONDANSETRON HCL 4 MG/2ML IJ SOLN: 4.0000 mg | Freq: Once | INTRAMUSCULAR | Status: DC | PRN

## 2022-01-01 MED ORDER — PHENYLEPHRINE 40 MCG/ML (10ML) SYRINGE FOR IV PUSH (FOR BLOOD PRESSURE SUPPORT)
PREFILLED_SYRINGE | INTRAVENOUS | Status: AC
  Filled 2022-01-01: qty 10

## 2022-01-01 MED ORDER — SUGAMMADEX SODIUM 500 MG/5ML IV SOLN
INTRAVENOUS | Status: DC | PRN
Start: 2022-01-01 — End: 2022-01-01
  Administered 2022-01-01: 200 mg via INTRAVENOUS

## 2022-01-01 MED ORDER — FENTANYL CITRATE PF 50 MCG/ML IJ SOSY
PREFILLED_SYRINGE | INTRAMUSCULAR | Status: AC
  Filled 2022-01-01: qty 1

## 2022-01-01 MED ORDER — POTASSIUM CHLORIDE CRYS ER 10 MEQ PO TBCR
10.0000 meq | EXTENDED_RELEASE_TABLET | Freq: Every day | ORAL | Status: DC
  Administered 2022-01-01 – 2022-01-02 (×2): 10 meq via ORAL
  Filled 2022-01-01 (×2): qty 1

## 2022-01-01 MED ORDER — ORAL CARE MOUTH RINSE: 15.0000 mL | Freq: Once | OROMUCOSAL | Status: DC

## 2022-01-01 MED ORDER — OXYCODONE HCL 5 MG PO TABS
5.0000 mg | ORAL_TABLET | ORAL | Status: DC | PRN
  Filled 2022-01-01 (×3): qty 1

## 2022-01-01 MED ORDER — BUPIVACAINE LIPOSOME 1.3 % IJ SUSP
INTRAMUSCULAR | Status: DC | PRN
  Administered 2022-01-01: 20 mL

## 2022-01-01 MED ORDER — TRAZODONE HCL 50 MG PO TABS
50.0000 mg | ORAL_TABLET | Freq: Two times a day (BID) | ORAL | Status: DC
  Administered 2022-01-02: 50 mg via ORAL
  Filled 2022-01-01: qty 1

## 2022-01-01 MED ORDER — DEXAMETHASONE SODIUM PHOSPHATE 10 MG/ML IJ SOLN
INTRAMUSCULAR | Status: DC | PRN
  Administered 2022-01-01: 5 mg via INTRAVENOUS

## 2022-01-01 MED ORDER — SUFENTANIL CITRATE 50 MCG/ML IV SOLN
INTRAVENOUS | Status: AC
  Filled 2022-01-01: qty 1

## 2022-01-01 MED ORDER — SUCCINYLCHOLINE CHLORIDE 200 MG/10ML IV SOSY
PREFILLED_SYRINGE | INTRAVENOUS | Status: DC | PRN
  Administered 2022-01-01: 100 mg via INTRAVENOUS

## 2022-01-01 MED ORDER — TRAZODONE HCL 50 MG PO TABS
200.0000 mg | ORAL_TABLET | Freq: Every day | ORAL | Status: DC
  Administered 2022-01-01: 200 mg via ORAL
  Filled 2022-01-01: qty 4

## 2022-01-01 MED ORDER — ROCURONIUM BROMIDE 10 MG/ML (PF) SYRINGE
PREFILLED_SYRINGE | INTRAVENOUS | Status: AC
  Filled 2022-01-01: qty 10

## 2022-01-01 MED ORDER — MELATONIN 3 MG PO TABS
9.0000 mg | ORAL_TABLET | Freq: Every day | ORAL | Status: DC
  Administered 2022-01-01: 9 mg via ORAL
  Filled 2022-01-01: qty 3

## 2022-01-01 MED ORDER — SODIUM CHLORIDE 0.9 % IR SOLN
Status: DC | PRN
  Administered 2022-01-01: 1000 mL

## 2022-01-01 MED ORDER — SENNA 8.6 MG PO TABS
1.0000 | ORAL_TABLET | Freq: Two times a day (BID) | ORAL | Status: DC
  Administered 2022-01-01 – 2022-01-02 (×2): 8.6 mg via ORAL
  Filled 2022-01-01 (×2): qty 1

## 2022-01-01 MED ORDER — ONDANSETRON 4 MG PO TBDP: 4.0000 mg | ORAL_TABLET | Freq: Four times a day (QID) | ORAL | Status: DC | PRN

## 2022-01-01 MED ORDER — DEXAMETHASONE SODIUM PHOSPHATE 10 MG/ML IJ SOLN
INTRAMUSCULAR | Status: AC
  Filled 2022-01-01: qty 1

## 2022-01-01 MED ORDER — UMECLIDINIUM BROMIDE 62.5 MCG/ACT IN AEPB
1.0000 | INHALATION_SPRAY | Freq: Every day | RESPIRATORY_TRACT | Status: DC
Start: 2022-01-02 — End: 2022-01-02

## 2022-01-01 SURGICAL SUPPLY — 40 items
ADH SKN CLS APL DERMABOND .7 (GAUZE/BANDAGES/DRESSINGS) ×1
APL PRP STRL LF DISP 70% ISPRP (MISCELLANEOUS) ×1
BLADE SURG 15 STRL LF DISP TIS (BLADE) ×1 IMPLANT
BLADE SURG 15 STRL SS (BLADE) ×2
CHLORAPREP W/TINT 26 (MISCELLANEOUS) ×2 IMPLANT
CLOTH BEACON ORANGE TIMEOUT ST (SAFETY) ×2 IMPLANT
COVER LIGHT HANDLE STERIS (MISCELLANEOUS) ×4 IMPLANT
DERMABOND ADVANCED (GAUZE/BANDAGES/DRESSINGS) ×1
DERMABOND ADVANCED .7 DNX12 (GAUZE/BANDAGES/DRESSINGS) ×1 IMPLANT
ELECT REM PT RETURN 9FT ADLT (ELECTROSURGICAL) ×2
ELECTRODE REM PT RTRN 9FT ADLT (ELECTROSURGICAL) ×1 IMPLANT
GAUZE 4X4 16PLY ~~LOC~~+RFID DBL (SPONGE) ×2 IMPLANT
GLOVE BIO SURGEON STRL SZ 6.5 (GLOVE) ×1 IMPLANT
GLOVE BIOGEL PI IND STRL 6.5 (GLOVE) IMPLANT
GLOVE BIOGEL PI IND STRL 7.0 (GLOVE) IMPLANT
GLOVE BIOGEL PI IND STRL 7.5 (GLOVE) IMPLANT
GLOVE BIOGEL PI INDICATOR 6.5 (GLOVE) ×1
GLOVE BIOGEL PI INDICATOR 7.0 (GLOVE) ×1
GLOVE BIOGEL PI INDICATOR 7.5 (GLOVE) ×1
GLOVE SURG SS PI 7.0 STRL IVOR (GLOVE) ×2 IMPLANT
GOWN STRL REUS W/TWL LRG LVL3 (GOWN DISPOSABLE) ×4 IMPLANT
INST SET MINOR GENERAL (KITS) ×2 IMPLANT
KIT TURNOVER KIT A (KITS) ×2 IMPLANT
MANIFOLD NEPTUNE II (INSTRUMENTS) ×2 IMPLANT
MESH VENTRALEX ST 1-7/10 CRC S (Mesh General) ×1 IMPLANT
NDL HYPO 18GX1.5 BLUNT FILL (NEEDLE) ×1 IMPLANT
NDL HYPO 21X1.5 SAFETY (NEEDLE) ×1 IMPLANT
NEEDLE HYPO 18GX1.5 BLUNT FILL (NEEDLE) ×2 IMPLANT
NEEDLE HYPO 21X1.5 SAFETY (NEEDLE) ×2 IMPLANT
NS IRRIG 1000ML POUR BTL (IV SOLUTION) ×2 IMPLANT
PACK MINOR (CUSTOM PROCEDURE TRAY) ×2 IMPLANT
PAD ARMBOARD 7.5X6 YLW CONV (MISCELLANEOUS) ×2 IMPLANT
PENCIL SMOKE EVACUATOR (MISCELLANEOUS) ×2 IMPLANT
SET BASIN LINEN APH (SET/KITS/TRAYS/PACK) ×2 IMPLANT
SUT ETHIBOND NAB MO 7 #0 18IN (SUTURE) ×2 IMPLANT
SUT MNCRL AB 4-0 PS2 18 (SUTURE) ×2 IMPLANT
SUT VIC AB 2-0 CT2 27 (SUTURE) ×1 IMPLANT
SUT VIC AB 3-0 SH 27 (SUTURE) ×2
SUT VIC AB 3-0 SH 27X BRD (SUTURE) ×1 IMPLANT
SYR 20ML LL LF (SYRINGE) ×4 IMPLANT

## 2022-01-01 NOTE — Interval H&P Note (Signed)
History and Physical Interval Note: ? ?01/01/2022 ?10:37 AM ? ?Gloria Chapman  has presented today for surgery, with the diagnosis of Umbilical Hernia.  The various methods of treatment have been discussed with the patient and family. After consideration of risks, benefits and other options for treatment, the patient has consented to  Procedure(s) with comments: ?HERNIA REPAIR UMBILICAL ADULT (N/A) - Dr. requests time 9:30, due to pt being in assisted living as a surgical intervention.  The patient's history has been reviewed, patient examined, no change in status, stable for surgery.  I have reviewed the patient's chart and labs.  Questions were answered to the patient's satisfaction.   ? ? ?Lucretia Roers ? ? ?

## 2022-01-01 NOTE — Transfer of Care (Signed)
Immediate Anesthesia Transfer of Care Note ? ?Patient: NAYZETH ALTMAN ? ?Procedure(s) Performed: HERNIA REPAIR UMBILICAL ADULT (Abdomen) ? ?Patient Location: PACU ? ?Anesthesia Type:General ? ?Level of Consciousness: awake and patient cooperative ? ?Airway & Oxygen Therapy: Patient Spontanous Breathing and Patient connected to nasal cannula oxygen ? ?Post-op Assessment: Report given to RN and Post -op Vital signs reviewed and stable ? ?Post vital signs: Reviewed and stable ? ?Last Vitals:  ?Vitals Value Taken Time  ?BP 149/74 1200  ?Temp 97.9   ?Pulse 89 01/01/22 1200  ?Resp 21 01/01/22 1200  ?SpO2 96 % 01/01/22 1200  ?Vitals shown include unvalidated device data. ? ?Last Pain:  ?Vitals:  ? 01/01/22 0906  ?TempSrc: Oral  ?PainSc: 8   ?   ? ?Patients Stated Pain Goal: 6 (01/01/22 3893) ? ?Complications: No notable events documented. ?

## 2022-01-01 NOTE — Progress Notes (Signed)
[facility] ? ?Patient nervous about going to assisted living and needing pain medication. She had a bad experience at her last outpatient procedure. Requesting to stay overnight. Will observe overnight and send home in the AM. ? ?[name], MD ?[facility] ?[address] ?[place], [place] [address] ?[phone] (office) ? ?

## 2022-01-01 NOTE — Op Note (Signed)
[facility] ?Operative Note ? ?01/01/22 ? ?Preoperative Diagnosis: Umbilical hernia, 1.5cm defect  ?  ?Postoperative Diagnosis: Same ?  ?Procedure(s) Performed: Umbilical hernia repair with mesh (4.3 Ventralex Patch) ?  ?Surgeon: Leatrice Jewels. Henreitta Leber, MD ?  ?Assistants: No qualified resident was available  ?  ?Anesthesia: General endotracheal ?  ?Anesthesiologist: Windell Norfolk, MD  ?  ?Specimens: None ?  ?Estimated Blood Loss: Minimal ?  ?Blood Replacement: None  ?  ?Complications: None  ? ?Wound Class: Clean ?  ?Operative Indications: Gloria Chapman is a 66 yo with an umbilical hernia that I tried to fix with absorbable sutures at the time of her cholecystectomy but this ultimately failed. She wanted to get this repaired more permanently. We discussed risk of bleeding, infection, injury to bowel, recurrence and use of mesh.  ? ?Findings: 1.5cm hernia defect ?  ?Procedure: The patient was taken to the operating room and placed supine. General endotracheal anesthesia was induced. Intravenous antibiotics were  administered per protocol.  The abdomen was prepared and draped in the usual sterile fashion.  ? ?The umbilical hernia was noted to be reducible and measured about 1.5cm. An incision was made under the umbilicus, and carried down through the subcutaneous tissue with electrocautery.  Dissection was performed down to the level of the fascia, exposing the hernia sac.  The hernia sac was opened with care, and excess hernia sac was resected with electrocautery.  A finger was ran on the underlying peritoneum and this was clear.  A 4.3 cm Ventralex St Hernia Patch was placed and secured with 0 Ethibond sutures ensuring that it was against the peritoneal cavity.  The hernia defect was then closed with 0 Ethibond suture in an interrupted fashion over the patch.  The umbilicus was tacked to the fascia with a 3-0 Vicryl suture.   Exparel was injected. Hemostasis was confirmed. The skin was closed with a  running 4-0 Monocryl suture and dermabond.   ? ?All counts were correct at the end of the case. The patient was awakened from anesthesia and extubated without complication.  The patient went to the PACU in stable condition. ? ?[name], MD ?[facility] ?[address] ?[place], [place] [address] ?[phone] (office) ? ? ? ?

## 2022-01-01 NOTE — Progress Notes (Signed)
Shelia LPN called from [facility] to get report on PT ?

## 2022-01-01 NOTE — Anesthesia Procedure Notes (Signed)
Procedure Name: Intubation ?Date/Time: 01/01/2022 11:17 AM ?Performed by: Vista Deck, CRNA ?Pre-anesthesia Checklist: Patient identified, Patient being monitored, Timeout performed, Emergency Drugs available and Suction available ?Patient Re-evaluated:Patient Re-evaluated prior to induction ?Oxygen Delivery Method: Circle system utilized ?Preoxygenation: Pre-oxygenation with 100% oxygen ?Induction Type: IV induction, Rapid sequence and Cricoid Pressure applied ?Laryngoscope Size: Mac and 3 ?Grade View: Grade I ?Tube type: Oral ?Tube size: 7.0 mm ?Number of attempts: 1 ?Airway Equipment and Method: Stylet ?Placement Confirmation: ETT inserted through vocal cords under direct vision, positive ETCO2 and breath sounds checked- equal and bilateral ?Secured at: 22 cm ?Tube secured with: Tape ?Dental Injury: Teeth and Oropharynx as per pre-operative assessment  ? ? ? ? ?

## 2022-01-01 NOTE — Addendum Note (Signed)
Addendum  created 01/01/22 1232 by Windell Norfolk, MD  ? Order list changed, Pharmacy for encounter modified  ?  ?

## 2022-01-01 NOTE — Anesthesia Preprocedure Evaluation (Signed)
Anesthesia Evaluation  ?Patient identified by MRN, date of birth, ID band ?Patient awake ? ? ? ?Reviewed: ?Allergy & Precautions, H&P , NPO status , Patient's Chart, lab work & pertinent test results, reviewed documented beta blocker date and time  ? ?History of Anesthesia Complications ?(+) PONV and history of anesthetic complications ? ?Airway ?Mallampati: II ? ?TM Distance: >3 FB ?Neck ROM: full ? ? ? Dental ?no notable dental hx. ? ?  ?Pulmonary ?shortness of breath, COPD, Current Smoker and Patient abstained from smoking.,  ?  ?Pulmonary exam normal ?breath sounds clear to auscultation ? ? ? ? ? ? Cardiovascular ?Exercise Tolerance: Good ?hypertension,  ?Rhythm:regular Rate:Normal ? ? ?  ?Neuro/Psych ? Headaches, PSYCHIATRIC DISORDERS Anxiety Depression  Neuromuscular disease   ? GI/Hepatic ?GERD  Medicated,(+) Hepatitis -, C  ?Endo/Other  ?diabetes, Type obesity ? Renal/GU ?negative Renal ROS  ?negative genitourinary ?  ?Musculoskeletal ? ? Abdominal ?  ?Peds ? Hematology ? ?(+) Blood dyscrasia, anemia ,   ?Anesthesia Other Findings ? ? Reproductive/Obstetrics ?negative OB ROS ? ?  ? ? ? ? ? ? ? ? ? ? ? ? ? ?  ?  ? ? ? ? ? ? ? ? ?Anesthesia Physical ?Anesthesia Plan ? ?ASA: 3 ? ?Anesthesia Plan: General and General ETT  ? ?Post-op Pain Management:   ? ?Induction:  ? ?PONV Risk Score and Plan: Ondansetron ? ?Airway Management Planned:  ? ?Additional Equipment:  ? ?Intra-op Plan:  ? ?Post-operative Plan:  ? ?Informed Consent: I have reviewed the patients History and Physical, chart, labs and discussed the procedure including the risks, benefits and alternatives for the proposed anesthesia with the patient or authorized representative who has indicated his/her understanding and acceptance.  ? ? ? ?Dental Advisory Given ? ?Plan Discussed with: CRNA ? ?Anesthesia Plan Comments:   ? ? ? ? ? ? ?Anesthesia Quick Evaluation ? ?

## 2022-01-01 NOTE — Anesthesia Postprocedure Evaluation (Signed)
Anesthesia Post Note ? ?Patient: Gloria Chapman ? ?Procedure(s) Performed: HERNIA REPAIR UMBILICAL ADULT (Abdomen) ? ?Patient location during evaluation: Phase II ?Anesthesia Type: General ?Level of consciousness: awake ?Pain management: pain level controlled ?Vital Signs Assessment: post-procedure vital signs reviewed and stable ?Respiratory status: spontaneous breathing and respiratory function stable ?Cardiovascular status: blood pressure returned to baseline and stable ?Postop Assessment: no headache and no apparent nausea or vomiting ?Anesthetic complications: no ?Comments: Late entry ? ? ?No notable events documented. ? ? ?Last Vitals:  ?Vitals:  ? 01/01/22 0906 01/01/22 1200  ?BP: 133/68 (!) 149/74  ?Pulse: 68 89  ?Resp: 12 (!) 21  ?Temp: 37.1 ?C 36.6 ?C  ?SpO2: 98% 96%  ?  ?Last Pain:  ?Vitals:  ? 01/01/22 1200  ?TempSrc:   ?PainSc: Asleep  ? ? ?  ?  ?  ?  ?  ?  ? ?Louann Sjogren ? ? ? ? ?

## 2022-01-02 ENCOUNTER — Encounter (HOSPITAL_COMMUNITY): Payer: Self-pay | Admitting: General Surgery

## 2022-01-02 DIAGNOSIS — K429 Umbilical hernia without obstruction or gangrene: Secondary | ICD-10-CM | POA: Diagnosis not present

## 2022-01-02 LAB — GLUCOSE, CAPILLARY
Glucose-Capillary: 292 mg/dL — ABNORMAL HIGH (ref 70–99)
Glucose-Capillary: 364 mg/dL — ABNORMAL HIGH (ref 70–99)

## 2022-01-02 MED ORDER — ONDANSETRON 4 MG PO TBDP: 4.0000 mg | ORAL_TABLET | Freq: Four times a day (QID) | ORAL | 0 refills | Status: DC | PRN

## 2022-01-02 MED ORDER — OXYCODONE HCL 5 MG PO TABS: 5.0000 mg | ORAL_TABLET | ORAL | 0 refills | Status: DC | PRN

## 2022-01-02 NOTE — Discharge Summary (Signed)
Physician Discharge Summary  ?Patient ID: ?Gloria Chapman ?MRN: [id number] ?DOB/AGE: 23-Mar-1956 66 y.o. ? ?Admit date: 01/01/2022 ?Discharge date: 01/02/2022 ? ?Admission Diagnoses: Umbilical hernia  ? ?Discharge Diagnoses:  ?Principal Problem: ?  Umbilical hernia without obstruction and without gangrene ?Active Problems: ?  Calculus of gallbladder without cholecystitis without obstruction ?  Umbilical hernia ? ? ?Discharged Condition: good ? ?Hospital Course: Ms. Gloria Chapman stayed overnight after her hernia repair with mesh due to concerns from the patient about post operative pain and getting pain medication from her facility in a timely manner. She had a poor post operative experience after her laparoscopic cholecystectomy. I agreed to let her stay overnight for pain control and monitoring. She is sore this morning but is eating and taking oral pain medication.  ? ?Consults: None ? ?Significant Diagnostic Studies: None ? ?Treatments: 0/64 Umbilical hernia repair with mesh  ? ?Discharge Exam: ?Blood pressure 115/82, pulse 78, temperature 98.2 ?F (36.8 ?C), temperature source Oral, resp. rate 18, height $RemoveBe'5\' 2"'gDYuiqEoR$  (1.575 m), weight 95.7 kg, SpO2 98 %. ?General appearance: alert and no distress ?Resp: normal work of breathing ?GI: soft, nondistended, appropriately tender, umbilical incision c/d/I with dermabond, bruising inferiorly  ? ?Disposition: Discharge disposition: 01-Home or Self Care ? ? ? ? ? ? ?Discharge Instructions   ? ? Call MD for:  difficulty breathing, headache or visual disturbances   Complete by: As directed ?  ? Call MD for:  extreme fatigue   Complete by: As directed ?  ? Call MD for:  persistant dizziness or light-headedness   Complete by: As directed ?  ? Call MD for:  persistant nausea and vomiting   Complete by: As directed ?  ? Call MD for:  redness, tenderness, or signs of infection (pain, swelling, redness, odor or green/yellow discharge around incision site)   Complete by: As directed ?  ? Call MD  for:  severe uncontrolled pain   Complete by: As directed ?  ? Call MD for:  temperature >100.4   Complete by: As directed ?  ? Increase activity slowly   Complete by: As directed ?  ? ?  ? ?Allergies as of 01/02/2022   ? ?   Reactions  ? Reglan [metoclopramide]   ? Tardive dyskinesia per patient  ? ?  ? ?  ?Medication List  ?  ? ?TAKE these medications   ? ?acetaminophen 325 MG tablet ?Commonly known as: TYLENOL ?Take 650 mg by mouth every 8 (eight) hours as needed for mild pain. ?  ?albuterol 108 (90 Base) MCG/ACT inhaler ?Commonly known as: VENTOLIN HFA ?Inhale 2 puffs into the lungs every 4 (four) hours as needed for wheezing or shortness of breath. ?  ?Alogliptin Benzoate 25 MG Tabs ?Take 25 mg by mouth daily. ?  ?aspirin EC 81 MG tablet ?Take 81 mg by mouth every evening. Swallow whole. ?  ?Cranberry 450 MG Tabs ?Take 450 mg by mouth every 12 (twelve) hours. ?  ?DULoxetine 60 MG capsule ?Commonly known as: CYMBALTA ?Take 60 mg by mouth daily. ?  ?fluticasone 50 MCG/ACT nasal spray ?Commonly known as: FLONASE ?Place 1 spray into both nostrils at bedtime. ?  ?furosemide 40 MG tablet ?Commonly known as: LASIX ?Take 40 mg by mouth in the morning. ?  ?gabapentin 300 MG capsule ?Commonly known as: NEURONTIN ?Take 300 mg by mouth 2 (two) times daily. ?  ?Glucagon HCl 1 MG Solr ?Inject 1 mg as directed daily as needed (blood sugar below 60 and unable  to take oral glucose replacement). ?  ?guaiFENesin 600 MG 12 hr tablet ?Commonly known as: [place] ?Take 600 mg by mouth 2 (two) times daily. ?  ?insulin aspart 100 UNIT/ML injection ?Commonly known as: novoLOG ?Inject 5 Units into the skin 3 (three) times daily before meals. Hold if blood glucose is below 200 ?  ?insulin glargine 100 UNIT/ML injection ?Commonly known as: LANTUS ?Inject 43 Units into the skin at bedtime. ?  ?ipratropium-albuterol 0.5-2.5 (3) MG/3ML Soln ?Commonly known as: DUONEB ?Take 3 mLs by nebulization every 6 (six) hours as needed (shortness of  breath, coughing or wheezing). ?  ?levocetirizine 5 MG tablet ?Commonly known as: XYZAL ?Take 5 mg by mouth daily at 12 noon. ?  ?lidocaine 5 % ?Commonly known as: LIDODERM ?Place 2 patches onto the skin daily. Apply one patch to left shoulder and one patch to right hip at 8am. Remove both patches at 8pm. ?  ?loratadine 10 MG tablet ?Commonly known as: CLARITIN ?Take 10 mg by mouth daily. ?  ?LORazepam 0.5 MG tablet ?Commonly known as: ATIVAN ?Take 0.5 mg by mouth every 8 (eight) hours. ?  ?melatonin 5 MG Tabs ?Take 10 mg by mouth at bedtime. ?  ?montelukast 10 MG tablet ?Commonly known as: SINGULAIR ?Take 10 mg by mouth at bedtime. ?  ?naloxone 4 MG/0.1ML Liqd nasal spray kit ?Commonly known as: NARCAN ?Place 1 spray into the nose See admin instructions. Spray 1 spray in nostril every 3 minutes as needed for overdose symptoms ?  ?ondansetron 4 MG disintegrating tablet ?Commonly known as: ZOFRAN-ODT ?Take 1 tablet (4 mg total) by mouth every 6 (six) hours as needed for nausea. ?  ?oxybutynin 10 MG 24 hr tablet ?Commonly known as: DITROPAN-XL ?Take 10 mg by mouth daily as needed (bladder spasms related to dysuria). ?  ?oxyCODONE 5 MG immediate release tablet ?Commonly known as: Roxicodone ?Take 1 tablet (5 mg total) by mouth every 4 (four) hours as needed for severe pain or breakthrough pain. Post operative pain; needed in addition to her chronic percocet ?  ?oxyCODONE-acetaminophen 10-325 MG tablet ?Commonly known as: PERCOCET ?Take 1 tablet by mouth every 8 (eight) hours. ?  ?pantoprazole 40 MG tablet ?Commonly known as: PROTONIX ?Take 1 tablet (40 mg total) by mouth 2 (two) times daily before a meal. ?  ?polyethylene glycol 17 g packet ?Commonly known as: MIRALAX / GLYCOLAX ?Take 17 g by mouth 2 (two) times daily. Mix and give with 4-8 oz. of fluid ?  ?potassium chloride 10 MEQ tablet ?Commonly known as: KLOR-CON M ?Take 10 mEq by mouth daily. ?  ?QUEtiapine 100 MG tablet ?Commonly known as: SEROQUEL ?Take 100 mg  by mouth at bedtime. ?  ?senna 8.6 MG Tabs tablet ?Commonly known as: SENOKOT ?Take 1 tablet by mouth every 12 (twelve) hours. ?  ?Stiolto Respimat 2.5-2.5 MCG/ACT Aers ?Generic drug: Tiotropium Bromide-Olodaterol ?Inhale 2 puffs into the lungs daily. ?  ?traZODone 100 MG tablet ?Commonly known as: DESYREL ?Take 200 mg by mouth at bedtime. ?  ?traZODone 50 MG tablet ?Commonly known as: DESYREL ?Take 50 mg by mouth 2 (two) times daily at 10 am and 4 pm. For Anxiety ?  ?Vitamin D 50 MCG (2000 UT) tablet ?Take 2,000 Units by mouth daily. ?  ? ?  ? ? Follow-up Information   ? ? Virl Cagey, MD Follow up in 4 week(s).   ?Specialty: General Surgery ?Why: Will see you in follow up in 4-5 weeks ?Contact information: ?[id number] ?[place] [address] ?[phone] ? ? ?  ?  ? ?  ?  ? ?  ? ?Future Appointments  ?Date Time Provider [facility]  ?01/31/2022  2:30 PM Mahala Menghini, PA-C [facility]  ?02/08/2022  1:15 PM Virl Cagey, MD [facility] None  ? ? ?Signed: ?Virl Cagey ?01/02/2022, 8:45 AM ? ? ?

## 2022-01-02 NOTE — Care Management Obs Status (Signed)
MEDICARE OBSERVATION STATUS NOTIFICATION ? ? ?Patient Details  ?Name: Gloria Chapman ?MRN: [id number] ?Date of Birth: 06-29-1956 ? ? ?Medicare Observation Status Notification Given:  Yes ? ? ? ?Corey Harold ?01/02/2022, 11:58 AM ?

## 2022-01-02 NOTE — Progress Notes (Signed)
Inpatient Diabetes Program Recommendations ? ?AACE/ADA: New Consensus Statement on Inpatient Glycemic Control  ? ?Target Ranges:  Prepandial:   less than 140 mg/dL ?     Peak postprandial:   less than 180 mg/dL (1-2 hours) ?     Critically ill patients:  140 - 180 mg/dL  ? ? Latest Reference Range & Units 01/02/22 08:02 01/02/22 11:52  ?Glucose-Capillary 70 - 99 mg/dL 149 (H) 702 (H)  ? ? Latest Reference Range & Units 01/01/22 08:47 01/01/22 12:41 01/01/22 19:50  ?Glucose-Capillary 70 - 99 mg/dL 637 (H) 858 (H) 850 (H)  ? ? ?Review of Glycemic Control ? ?Diabetes history: DM2 ?Outpatient Diabetes medications: Lantus 43 units QHS, Novolog 5 units TID with meals, Nesina 25 mg daily ?Current orders for Inpatient glycemic control: Semglee 21 units QHS, Novolog 5 units TID with meals, Nesina 25 mg daily ? ?Inpatient Diabetes Program Recommendations:   ? ?Insulin: If remains inpatient, please consider increasing Semglee to 30 units QHS, ordering Novolog 0-15 units TID with meals and Novolog 0-5 units QHS. ? ?NOTE: Patient received Decadron 5 mg on 01/01/22 which is contributing to hyperglycemia. ? ?Thanks, ?Orlando Penner, RN, MSN, CDE ?Diabetes Coordinator ?Inpatient Diabetes Program ?[phone] (Team Pager from 8am to 5pm) ? ? ? ?

## 2022-01-02 NOTE — Progress Notes (Signed)
Patient has rested well and vitals have  been stable this shift. Patient has voided several times.Patient has c/o nausea, but states she has it because of her reflux and not being able to " sit up" like she normally does.  ?

## 2022-01-02 NOTE — Discharge Instructions (Signed)
Discharge Instructions Hernia: ? ?Common Complaints: ?Pain at the incision site is common. This will improve with time. Take your pain medications as described below. ?Some nausea is common and poor appetite. The main goal is to stay hydrated the first few days after surgery.  ? ?Diet/ Activity: ?Diet as tolerated. You may not have an appetite, but it is important to stay hydrated. Drink 64 ounces of water a day. Your appetite will return with time.  ?Shower per your regular routine daily.  Do not take hot showers. Take warm showers that are less than 10 minutes. ?Rest and listen to your body, but do not remain in bed all day.Walk everyday for at least 15-20 minutes.  ?Deep cough and move around every 1-2 hours in the first few days after surgery. ?Do not pick at the dermabond glue on your incision sites.  This glue film will remain in place for 1-2 weeks and will start to peel off. ?Do not place lotions or balms on your incision unless instructed to specifically by Dr. Henreitta Leber. ?Do not lift > 10 lbs, perform excessive bending, pushing, pulling, squatting for 6-8 weeks after surgery. ?Where your abdominal binder with activity as much as possible. ?The activity restrictions and the abdominal binder are to prevent hernia formation at your incision while you are healing.  ? ?Pain Expectations and Narcotics: ?-After surgery you will have pain associated with your incisions and this is normal. The pain is muscular and nerve pain, and will get better with time. ?-You are encouraged and expected to take non narcotic medications like tylenol and ibuprofen (when able) to treat pain as multiple modalities can aid with pain treatment. ?-Narcotics are only used when pain is severe or there is breakthrough pain. ?-You are not expected to have a pain score of 0 after surgery, as we cannot prevent pain. A pain score of 3-4 that allows you to be functional, move, walk, and tolerate some activity is the goal. The pain will continue  to improve over the days after surgery and is dependent on your surgery. ?-Due to [place] law, we are only able to give a certain amount of pain medication to treat post operative pain, and we only give additional narcotics on a patient by patient basis.  ?-For most laparoscopic surgery, studies have shown that the majority of patients only need 10-15 narcotic pills, and for open surgeries most patients only need 15-20.   ?-Having appropriate expectations of pain and knowledge of pain management with non narcotics is important as we do not want anyone to become addicted to narcotic pain medication.  ?-Using ice packs in the first 48 hours and heating pads after 48 hours, wearing an abdominal binder (when recommended), and using over the counter medications are all ways to help with pain management.   ?-Simple acts like meditation and mindfulness practices after surgery can also help with pain control and research has proven the benefit of these practices. ? ?Medication: ?Take tylenol and ibuprofen as needed for pain control, alternating every 4-6 hours.  ?Example:  ?Tylenol 1000mg  @ 6am, 12noon, 6pm, (Do not exceed 4000mg  of tylenol a day). Ibuprofen 800mg  @ 9am, 3pm, 9pm, 3am (Do not exceed 3600mg  of ibuprofen a day).  ?Take your regular percocet for pain.  ?Take Roxicodone for breakthrough pain every 4 hours.  ?Take Colace for constipation related to narcotic pain medication. If you do not have a bowel movement in 2 days, take Miralax over the counter.  ?Drink plenty of  water to also prevent constipation.  ? ?Contact Information: ?If you have questions or concerns, please call our office, [phone], Monday- Thursday 8AM-5PM and Friday 8AM-12Noon.  ?If it is after hours or on the weekend, please call [facility] Main [facility], [phone], [phone], and ask to speak to the surgeon on call for Dr. Henreitta Leber at [facility].  ? ? ?

## 2022-01-02 NOTE — TOC Progression Note (Signed)
?  Transition of Care (TOC) Screening Note ? ? ?Patient Details  ?Name: GLINDA BURGON ?Date of Birth: [birth year] ? ? ?Transition of Care (TOC) CM/SW Contact:    ?Shade Flood, LCSW ?Phone Number: ?01/02/2022, 12:58 PM ? ? ?Received TOC consult for transportation back to SNF. Pt is in LTC at [facility]. Called Bryson Ha with [facility] to request transport with their w/c [facility]. Bryson Ha states that they will have their [facility] come get pt as soon as they are available. Updated RN. ? ?Transition of Care Department [facility]) has reviewed patient and no other TOC needs have been identified at this time. We will continue to monitor patient advancement through interdisciplinary progression rounds. If new patient transition needs arise, please place a TOC consult. ? ? ?

## 2022-01-08 ENCOUNTER — Ambulatory Visit: Payer: Medicare Other | Admitting: Gastroenterology

## 2022-01-31 ENCOUNTER — Encounter: Payer: Self-pay | Admitting: Gastroenterology

## 2022-01-31 ENCOUNTER — Ambulatory Visit (INDEPENDENT_AMBULATORY_CARE_PROVIDER_SITE_OTHER): Payer: Medicare Other | Admitting: Gastroenterology

## 2022-01-31 VITALS — BP 130/72 | HR 87 | Temp 98.0°F | Ht 62.0 in | Wt 220.0 lb

## 2022-01-31 DIAGNOSIS — K3184 Gastroparesis: Secondary | ICD-10-CM

## 2022-01-31 DIAGNOSIS — K219 Gastro-esophageal reflux disease without esophagitis: Secondary | ICD-10-CM | POA: Diagnosis not present

## 2022-01-31 DIAGNOSIS — K5900 Constipation, unspecified: Secondary | ICD-10-CM

## 2022-01-31 MED ORDER — SIMETHICONE 125 MG PO CAPS: 125.0000 mg | ORAL_CAPSULE | Freq: Three times a day (TID) | ORAL | 4 refills | Status: DC

## 2022-01-31 MED ORDER — ONDANSETRON 8 MG PO TBDP: ORAL_TABLET | ORAL | 5 refills | Status: DC

## 2022-01-31 NOTE — Patient Instructions (Addendum)
Continue pantoprazole 40mg  twice daily before a meal. ?Continue miralax 17 grams twice daily. ?Discontinue odansetron 4mg  prn.  ?Start odansetron ODT 8mg , take 8mg  PO 8am and 4pm scheduled. ?Add simethicone 125 mg PO with meals. ?Return office visit in 3 months.  ?

## 2022-01-31 NOTE — Progress Notes (Signed)
? ? ? ?GI Office Note   ? ?Referring Provider: Audree BaneKing, Peter, DO ?Primary Care Physician:  Audree BaneKing, Peter, DO  ?Primary Gastroenterologist: Roetta SessionsMichael Rourk, MD ? ? ?Chief Complaint  ? ?Chief Complaint  ?Patient presents with  ? Nausea  ? Gas  ? Abdominal Pain  ?  States that she can't tell that her gallbladder was taken out because her stomach still hurts (central part of abdomen)  ? ? ?History of Present Illness  ? ?Gloria Chapman is a 66 y.o. female presenting today for follow-up.  Last seen in the office in October 2022.  She has a history of gastroparesis likely secondary to combination of opioid therapy and anticholinergics.  History of chronic GERD, hepatitis C status post eradication, constipation, chronic abdominal pain.  She has refused colonoscopies.  We attempted Cologuard testing in the nursing home setting, previous specimen determined indeterminate facility provider told them not to repeat it. ? ?After last visit she completed abdominal ultrasound October 2022 that showed biliary sludge, diffusely increased parenchymal echogenicity but no discrete contour nodularity. She was seen by general surgery, Dr. Larae GroomsLindsey Bridges.  She underwent laparoscopic cholecystectomy January 2023.  She was noted to have distended gallbladder.  No stones seen on pathology.  She had chronic cholecystitis. ? ?CT abdomen pelvis with contrast December 08, 2021 liver measured 18.9 cm, malrotation of the right kidney but no hydronephrosis, umbilical hernia containing fat.  She had marked deformity of the right acetabulum.  Deformity in the proximal right femur residual from previous trauma and possibly surgery.  There was superior displacement of the right femur in relation to the acetabulum with no significant interval change. ? ?Underwent umbilical hernia repair with mesh April 2023 for 1.5 cm defect by Dr. Henreitta LeberBridges. ? ?Today: still having abdominal pain. Around the umbilicus. Sore since surgery. Also normal diffuse, migratory pain that  she always has. Heartburn doing ok on Pantoprazole 40 mg twice daily. Still having a lot of nausea, worse in the morning and around lunch. Takes zofran 4mg  twice daily and feels like 8mg  may be better. No longer on phenergan. Appetite better in the later afternoon which she contributes to trazadone. BMs daily every morning. Stools loose. Avoids straining. No melena, brbpr.  Still having a lot of gas. Quit drinking milk and has helped some but still having a lot of problems.  ? ?EGD November 2021 with oral candidiasis, possibly early Candida esophagitis.  Esophagus dilated for history of dysphagia. ?  ?Gastric emptying study October 2020 showing abnormal delayed gastric emptying. ? ?Medications  ? ?Current Outpatient Medications  ?Medication Sig Dispense Refill  ? albuterol (VENTOLIN HFA) 108 (90 Base) MCG/ACT inhaler Inhale 2 puffs into the lungs every 4 (four) hours as needed for wheezing or shortness of breath.    ? Alogliptin Benzoate 25 MG TABS Take 25 mg by mouth daily.    ? aspirin EC 81 MG tablet Take 81 mg by mouth every evening. Swallow whole.    ? Cholecalciferol (VITAMIN D) 50 MCG (2000 UT) tablet Take 2,000 Units by mouth daily.    ? Cranberry 450 MG TABS Take 450 mg by mouth every 12 (twelve) hours.    ? DULoxetine (CYMBALTA) 60 MG capsule Take 60 mg by mouth daily.    ? fluticasone (FLONASE) 50 MCG/ACT nasal spray Place 1 spray into both nostrils at bedtime.    ? furosemide (LASIX) 40 MG tablet Take 40 mg by mouth in the morning.    ? gabapentin (NEURONTIN) 300 MG  capsule Take 300 mg by mouth 2 (two) times daily.    ? Glucagon HCl 1 MG SOLR Inject 1 mg as directed daily as needed (blood sugar below 60 and unable to take oral glucose replacement).    ? guaiFENesin (MUCINEX) 600 MG 12 hr tablet Take 600 mg by mouth 2 (two) times daily.    ? insulin aspart (NOVOLOG) 100 UNIT/ML injection Inject 5 Units into the skin 3 (three) times daily before meals. Hold if blood glucose is below 200    ? insulin  glargine (LANTUS) 100 UNIT/ML injection Inject 43 Units into the skin at bedtime.    ? ipratropium-albuterol (DUONEB) 0.5-2.5 (3) MG/3ML SOLN Take 3 mLs by nebulization every 6 (six) hours as needed (shortness of breath, coughing or wheezing).    ? levocetirizine (XYZAL) 5 MG tablet Take 5 mg by mouth daily at 12 noon.    ? lidocaine (LIDODERM) 5 % Place 2 patches onto the skin daily. Apply one patch to left shoulder and one patch to right hip at 8am. Remove both patches at 8pm.    ? loratadine (CLARITIN) 10 MG tablet Take 10 mg by mouth daily.    ? LORazepam (ATIVAN) 0.5 MG tablet Take 0.5 mg by mouth every 8 (eight) hours.    ? Melatonin 5 MG TABS Take 10 mg by mouth at bedtime.     ? montelukast (SINGULAIR) 10 MG tablet Take 10 mg by mouth at bedtime.    ? naloxone (NARCAN) nasal spray 4 mg/0.1 mL Place 1 spray into the nose See admin instructions. Spray 1 spray in nostril every 3 minutes as needed for overdose symptoms    ? ondansetron (ZOFRAN-ODT) 4 MG disintegrating tablet Take 1 tablet (4 mg total) by mouth every 6 (six) hours as needed for nausea. 20 tablet 0  ? oxybutynin (DITROPAN-XL) 10 MG 24 hr tablet Take 10 mg by mouth daily as needed (bladder spasms related to dysuria).    ? oxyCODONE-acetaminophen (PERCOCET) 10-325 MG tablet Take 1 tablet by mouth every 8 (eight) hours.    ? pantoprazole (PROTONIX) 40 MG tablet Take 1 tablet (40 mg total) by mouth 2 (two) times daily before a meal. 180 tablet 3  ? polyethylene glycol (MIRALAX / GLYCOLAX) packet Take 17 g by mouth 2 (two) times daily. Mix and give with 4-8 oz. of fluid    ? potassium chloride (KLOR-CON) 10 MEQ tablet Take 10 mEq by mouth daily.    ? QUEtiapine (SEROQUEL) 100 MG tablet Take 100 mg by mouth at bedtime.    ? senna (SENOKOT) 8.6 MG TABS tablet Take 1 tablet by mouth every 12 (twelve) hours.    ? Tiotropium Bromide-Olodaterol (STIOLTO RESPIMAT) 2.5-2.5 MCG/ACT AERS Inhale 2 puffs into the lungs daily.    ? traZODone (DESYREL) 100 MG tablet  Take 200 mg by mouth at bedtime.    ? traZODone (DESYREL) 50 MG tablet Take 50 mg by mouth 2 (two) times daily at 10 am and 4 pm. For Anxiety    ? ?No current facility-administered medications for this visit.  ? ? ?Allergies  ? ?Allergies as of 01/31/2022 - Review Complete 01/31/2022  ?Allergen Reaction Noted  ? Reglan [metoclopramide]  06/21/2021  ? ?   ? ?Review of Systems  ? ?General: Negative for anorexia, weight loss, fever, chills, fatigue, weakness. ?ENT: Negative for hoarseness, difficulty swallowing , nasal congestion. ?CV: Negative for chest pain, angina, palpitations, dyspnea on exertion, peripheral edema.  ?Respiratory: Negative for dyspnea at rest,  dyspnea on exertion, cough, sputum, wheezing.  ?GI: See history of present illness. ?GU:  Negative for dysuria, hematuria, urinary incontinence, urinary frequency, nocturnal urination.  ?Endo: Negative for unusual weight change.  ?   ?Physical Exam  ? ?BP 130/72 (BP Location: Right Arm, Patient Position: Sitting, Cuff Size: Large)   Pulse 87   Temp 98 ?F (36.7 ?C) (Temporal)   Ht 5\' 2"  (1.575 m)   Wt 220 lb (99.8 kg) Comment: patient reported, in wheelchair  SpO2 96%   BMI 40.24 kg/m?  ?  ?General: Well-nourished, well-developed in no acute distress. Presents in wheelchair. She transfers herself from chair to bed etc but overall non ambulatory chronically since her hip surgery failed yesterday ago. ?Eyes: No icterus. ?Mouth: Oropharyngeal mucosa moist and pink , no lesions erythema or exudate. ?Lungs: Clear to auscultation bilaterally.  ?Heart: Regular rate and rhythm, no murmurs rubs or gallops.  ?Abdomen: Bowel sounds are normal, obese but nondistended, no hepatosplenomegaly or masses,  ?no abdominal bruits or hernia , no rebound or guarding. Mild diffuse upper abdominal and periumbilical pain. ?Rectal: not performed  ?Extremities: chronic lower ext edema, 2+. No clubbing or deformities. ?Neuro: Alert and oriented x 4   ?Skin: Warm and dry, no  jaundice.   ?Psych: Alert and cooperative, normal mood and affect. ? ?Labs  ? ?Lab Results  ?Component Value Date  ? CREATININE 0.70 12/08/2021  ? BUN 11 11/04/2021  ? NA 133 (L) 11/04/2021  ? K 3.8 11/04/2021  ? CL 9

## 2022-02-06 ENCOUNTER — Ambulatory Visit: Payer: Medicare Other | Admitting: Gastroenterology

## 2022-02-08 ENCOUNTER — Ambulatory Visit (INDEPENDENT_AMBULATORY_CARE_PROVIDER_SITE_OTHER): Payer: Medicare Other | Admitting: General Surgery

## 2022-02-08 ENCOUNTER — Encounter: Payer: Self-pay | Admitting: General Surgery

## 2022-02-08 VITALS — BP 114/76 | HR 78 | Temp 97.9°F | Resp 18 | Ht 62.0 in | Wt 220.0 lb

## 2022-02-08 DIAGNOSIS — K429 Umbilical hernia without obstruction or gangrene: Secondary | ICD-10-CM

## 2022-02-08 MED ORDER — OXYCODONE HCL 5 MG PO TABS: 5.0000 mg | ORAL_TABLET | Freq: Four times a day (QID) | ORAL | 0 refills | Status: DC | PRN

## 2022-02-08 NOTE — Patient Instructions (Addendum)
Doing well. No heavy lifting > 10 lbs, excessive bending, pushing, pulling, or squatting for 6-8 weeks after surgery.  Shower per routine.

## 2022-02-08 NOTE — Progress Notes (Signed)
[facility]  Doing well overall. Sore at incision.  BP 114/76   Pulse 78   Temp 97.9 F (36.6 C) (Oral)   Resp 18   Ht 5\' 2"  (1.575 m)   Wt 220 lb (99.8 kg)   SpO2 94%   BMI 40.24 kg/m  Incision healing, no erythema or drainage, no hernia   Patient s/p umbilical hernia repair with mesh. Doing well.  Will give Roxicodone 5 mg q 6 PRN # 8 for some continued breakthrough pain No heavy lifting > 10 lbs, excessive bending, pushing, pulling, or squatting for 6-8 weeks after surgery.   , MD [facility] [address] [place], [place] [phone] (office)

## 2022-05-08 ENCOUNTER — Ambulatory Visit (INDEPENDENT_AMBULATORY_CARE_PROVIDER_SITE_OTHER): Payer: Medicare Other | Admitting: Gastroenterology

## 2022-05-08 ENCOUNTER — Encounter: Payer: Self-pay | Admitting: Gastroenterology

## 2022-05-08 VITALS — BP 118/77 | HR 89 | Temp 97.7°F | Ht 62.0 in | Wt 224.0 lb

## 2022-05-08 DIAGNOSIS — K5900 Constipation, unspecified: Secondary | ICD-10-CM | POA: Diagnosis not present

## 2022-05-08 DIAGNOSIS — K3184 Gastroparesis: Secondary | ICD-10-CM

## 2022-05-08 DIAGNOSIS — K219 Gastro-esophageal reflux disease without esophagitis: Secondary | ICD-10-CM

## 2022-05-08 MED ORDER — TRULANCE 3 MG PO TABS: 3.0000 mg | ORAL_TABLET | Freq: Every day | ORAL | 5 refills | Status: DC

## 2022-05-08 MED ORDER — ONDANSETRON 4 MG PO TBDP: 4.0000 mg | ORAL_TABLET | Freq: Three times a day (TID) | ORAL | 5 refills | Status: DC

## 2022-05-08 NOTE — Patient Instructions (Addendum)
Change Miralax to 17 grams twice daily AS NEEDED. Start Trulance 3mg  orally daily for constipation.  Change ondansetron to 4mg  orally before breakfast, lunch, and evening meal.  Return to the office in six months.

## 2022-05-08 NOTE — Progress Notes (Signed)
GI Office Note    Referring Provider: Audree Bane, DO Primary Care Physician:  Audree Bane, DO  Primary Gastroenterologist: Roetta Sessions, MD   Chief Complaint   Chief Complaint  Patient presents with   Abdominal Pain    Unable to eat lunch due to nausea and abd pain, states that she feels funny    History of Present Illness   Gloria Chapman is a 66 y.o. female presenting today for follow up. Last seen in 01/2022. She has a history of gastroparesis likely secondary to combination of opioid therapy and anticholinergics.  History of chronic GERD, hepatitis C status post eradication, constipation, chronic abdominal pain.  She has refused colonoscopies.  We attempted Cologuard testing in the nursing home setting, previous specimen determined indeterminate facility provider told them not to repeat it.  This year she has underwent laparoscopic cholecystectomy in January 2023, umbilical hernia repair with mesh April 2023.  EGD November 2021 with oral candidiasis, possibly early Candida esophagitis.  Esophagus dilated for history of dysphagia.   Gastric emptying study October 2020 showing abnormal delayed gastric emptying.  Presents today with ongoing concerns for constipation, nausea. She does not find Miralax effective enough. She wants to increase her dose. She is on miralax bid. Nausea still bad in the morning and before lunch. Typically eats in the evenings the best. Currently on zofran at 8am and 4pm. No heartburn on PPI. No melena, brbpr. No dysphagia.     Medications   Current Outpatient Medications  Medication Sig Dispense Refill   acetaminophen (TYLENOL) 325 MG tablet Take 650 mg by mouth every 8 (eight) hours as needed.     albuterol (VENTOLIN HFA) 108 (90 Base) MCG/ACT inhaler Inhale 2 puffs into the lungs every 4 (four) hours as needed for wheezing or shortness of breath.     Alogliptin Benzoate 25 MG TABS Take 25 mg by mouth daily.     aspirin EC 81 MG tablet Take 81  mg by mouth every evening. Swallow whole.     Cholecalciferol (VITAMIN D) 50 MCG (2000 UT) tablet Take 2,000 Units by mouth daily.     Cranberry 450 MG TABS Take 450 mg by mouth every 12 (twelve) hours.     DULoxetine (CYMBALTA) 60 MG capsule Take 60 mg by mouth daily.     fexofenadine (ALLEGRA) 180 MG tablet Take 180 mg by mouth daily.     fluticasone (FLONASE) 50 MCG/ACT nasal spray Place 1 spray into both nostrils at bedtime.     furosemide (LASIX) 40 MG tablet Take 40 mg by mouth daily.     gabapentin (NEURONTIN) 300 MG capsule Take 300 mg by mouth 2 (two) times daily.     Glucagon HCl 1 MG SOLR Inject 1 mg as directed daily as needed (blood sugar below 60 and unable to take oral glucose replacement).     insulin aspart (NOVOLOG) 100 UNIT/ML injection Inject 5 Units into the skin 3 (three) times daily before meals. Hold if blood glucose is below 200     insulin glargine (LANTUS) 100 UNIT/ML injection Inject 43 Units into the skin at bedtime.     ipratropium-albuterol (DUONEB) 0.5-2.5 (3) MG/3ML SOLN Take 3 mLs by nebulization every 6 (six) hours as needed (shortness of breath, coughing or wheezing).     levocetirizine (XYZAL) 5 MG tablet Take 5 mg by mouth daily at 12 noon.     lidocaine (LIDODERM) 5 % Place 2 patches onto the skin daily. Apply one  patch to left shoulder and one patch to right hip at 8am. Remove both patches at 8pm.     LORazepam (ATIVAN) 0.5 MG tablet Take 0.5 mg by mouth every 8 (eight) hours.     Melatonin 5 MG TABS Take 10 mg by mouth at bedtime.      montelukast (SINGULAIR) 10 MG tablet Take 10 mg by mouth at bedtime.     naloxone (NARCAN) nasal spray 4 mg/0.1 mL Place 1 spray into the nose See admin instructions. Spray 1 spray in nostril every 3 minutes as needed for overdose symptoms     ondansetron (ZOFRAN-ODT) 8 MG disintegrating tablet 8mg  SL at 8am and 4pm daily 60 tablet 5   oxybutynin (DITROPAN-XL) 10 MG 24 hr tablet Take 10 mg by mouth daily as needed (bladder  spasms related to dysuria).     oxyCODONE-acetaminophen (PERCOCET) 10-325 MG tablet Take 1 tablet by mouth every 8 (eight) hours.     pantoprazole (PROTONIX) 40 MG tablet Take 1 tablet (40 mg total) by mouth 2 (two) times daily before a meal. 180 tablet 3   Polyethyl Glycol-Propyl Glycol (SYSTANE) 0.4-0.3 % SOLN Apply 1 drop to eye 2 (two) times daily.     polyethylene glycol (MIRALAX / GLYCOLAX) packet Take 17 g by mouth 2 (two) times daily. Mix and give with 4-8 oz. of fluid     potassium chloride (KLOR-CON) 10 MEQ tablet Take 10 mEq by mouth daily.     QUEtiapine (SEROQUEL) 100 MG tablet Take 100 mg by mouth at bedtime.     senna (SENOKOT) 8.6 MG TABS tablet Take 1 tablet by mouth every 12 (twelve) hours.     Simethicone 125 MG CAPS Take 1 capsule (125 mg total) by mouth 3 (three) times daily with meals. 90 capsule 4   tizanidine (ZANAFLEX) 2 MG capsule Take 2 mg by mouth daily.     traZODone (DESYREL) 100 MG tablet Take 200 mg by mouth at bedtime.     traZODone (DESYREL) 50 MG tablet Take 50 mg by mouth 2 (two) times daily at 10 am and 4 pm. For Anxiety     No current facility-administered medications for this visit.    Allergies   Allergies as of 05/08/2022 - Review Complete 05/08/2022  Allergen Reaction Noted   Reglan [metoclopramide]  06/21/2021        Review of Systems   General: Negative for anorexia, weight loss, fever, chills, fatigue, weakness. ENT: Negative for hoarseness, difficulty swallowing , nasal congestion. CV: Negative for chest pain, angina, palpitations, dyspnea on exertion, ++peripheral edema.  Respiratory: Negative for dyspnea at rest, dyspnea on exertion, cough, sputum, wheezing.  GI: See history of present illness. GU:  Negative for dysuria, hematuria, urinary incontinence, urinary frequency, nocturnal urination.  Endo: Negative for unusual weight change.     Physical Exam   BP 118/77 (BP Location: Right Arm, Patient Position: Sitting, Cuff Size: Large)    Pulse 89   Temp 97.7 F (36.5 C) (Temporal)   Ht 5\' 2"  (1.575 m)   Wt 224 lb (101.6 kg) Comment: patient reported  SpO2 93%   BMI 40.97 kg/m    General: Well-nourished, well-developed in no acute distress.  Eyes: No icterus. Mouth: Oropharyngeal mucosa moist and pink , no lesions erythema or exudate. Lungs: Clear to auscultation bilaterally.  Heart: Regular rate and rhythm, no murmurs rubs or gallops.  Abdomen: Bowel sounds are normal, nontender, nondistended, no hepatosplenomegaly or masses,  no abdominal bruits or hernia ,  no rebound or guarding.  Rectal: not performed Extremities: 2+ pitting edema bilateral lower extremity edema. No clubbing or deformities. Neuro: Alert and oriented x 4   Skin: Warm and dry, no jaundice.   Psych: Alert and cooperative, normal mood and affect.  Labs   Lab Results  Component Value Date   CREATININE 0.70 12/08/2021   BUN 11 11/04/2021   NA 133 (L) 11/04/2021   K 3.8 11/04/2021   CL 98 11/04/2021   CO2 26 11/04/2021   Lab Results  Component Value Date   ALT 13 11/04/2021   AST 17 11/04/2021   ALKPHOS 67 11/04/2021   BILITOT 0.3 11/04/2021   Lab Results  Component Value Date   WBC 13.0 (H) 11/04/2021   HGB 14.2 11/04/2021   HCT 43.5 11/04/2021   MCV 89.0 11/04/2021   PLT 255 11/04/2021    Imaging Studies   No results found.  Assessment   Gastroparesis: Reglan stopped due to tardive dyskinesia.  Has been off this for some time.  No longer on Phenergan.  Takes zofran 8mg  at 8am and 4pm and does not feel like helping at lunch. She prefers dosing qac. Will adjust dosing to 4mg  qac.   GERD: Well-controlled on pantoprazole 40 mg twice daily  Constipation: poorly controlled.   History of hepatitis C: Confirmed eradication.  CT last year with questionable early cirrhosis, no evidence of advanced liver disease on current CT imaging in March 2023.  Abdominal pain: Surgeries this year including laparoscopic cholecystectomy and  umbilical hernia repair.  Declined colonoscopy multiple times.  EGD 2021 as outlined. Overall no significant abdominal pain recently. Mostly constipation and gas concerns.   PLAN   Change miralax to BID prn.  Start Trulance 3mg  daily. Ondasetron 4 mg sublingual before every meal (3 times daily) Return office visit in 6 months.  April 2023. 2022, MHS, PA-C [facility]

## 2022-10-30 ENCOUNTER — Ambulatory Visit (INDEPENDENT_AMBULATORY_CARE_PROVIDER_SITE_OTHER): Payer: Medicare Other | Admitting: Internal Medicine

## 2022-10-30 VITALS — BP 121/77 | HR 80 | Temp 97.6°F | Ht 62.0 in | Wt 228.8 lb

## 2022-10-30 DIAGNOSIS — K3184 Gastroparesis: Secondary | ICD-10-CM

## 2022-10-30 DIAGNOSIS — K5900 Constipation, unspecified: Secondary | ICD-10-CM

## 2022-10-30 DIAGNOSIS — K219 Gastro-esophageal reflux disease without esophagitis: Secondary | ICD-10-CM

## 2022-10-30 MED ORDER — ONDANSETRON 4 MG PO TBDP: 4.0000 mg | ORAL_TABLET | Freq: Three times a day (TID) | ORAL | 3 refills | Status: DC

## 2022-10-30 NOTE — Patient Instructions (Addendum)
It was good to see you again today!  We will continue Trulance 3 mg daily (dispense 90 with 3 refills)  Continue to use MiraLAX 17 g orally on a daily basis if needed for constipation  Ondansetron 8 mg ODT tablets take 1 under the tongue twice daily as needed for nausea (dispense 180 with 3 additional refills)  Continue Protonix or pantoprazole 40 mg twice daily (take 30 minutes before breakfast and supper)  It has been recommended over time and again today that you ought to have a colonoscopy.  If you decide to have one we will be glad to set that up.  Just let us know  Plan to see you back in the office 1 year and as needed

## 2022-10-30 NOTE — Progress Notes (Signed)
Primary Care Physician:  Renata Caprice, DO Primary Gastroenterologist:  Dr. Gala Romney  Pre-Procedure History & Physical: HPI:  Gloria Chapman is a 67 y.o. female here for follow-up of GERD, gastroparesis, constipation in the setting of a diffuse GI motility disorder  - multifactorial in etiology (diabetes, polypharmacy including opioids and lack of mobility) She reports today she is doing very well with Trulance 3 mg daily supplemented with MiraLAX as needed; 1-2 bowel movements daily.  No bleeding.  GERD well-controlled on twice daily Protonix.  Occasional nausea controlled with ondansetron.  She wishes to have the ondansetron 8 mg ODT tablet twice daily.  She has gained 8 pounds since her last visit.  She continues to decline having a colonoscopy.   Past Medical History:  Diagnosis Date   Anemia    Anxiety    Arthritis    COPD (chronic obstructive pulmonary disease) (HCC)    Depression    Diabetes mellitus without complication (HCC)    Dyspnea    Fibromyalgia    GERD (gastroesophageal reflux disease)    Headache    Heart failure (HCC)    Heart murmur    Hepatitis    History of Clostridium difficile infection    History of methicillin resistant staphylococcus aureus (MRSA)    Insomnia    Overactive bladder    Pneumonia    PONV (postoperative nausea and vomiting)     Past Surgical History:  Procedure Laterality Date   APPENDECTOMY     BACK SURGERY     twice   CHOLECYSTECTOMY N/A 09/20/2021   Procedure: LAPAROSCOPIC CHOLECYSTECTOMY;  Surgeon: Virl Cagey, MD;  Location: [facility];  Service: General;  Laterality: N/A;   DIAGNOSTIC LAPAROSCOPY     checking for endometriosis   ESOPHAGOGASTRODUODENOSCOPY (EGD) WITH PROPOFOL N/A 11/21/2017   Dr. Gala Romney: Erosive reflux esophagitis, low-grade narrowing Schatzki ring status post dilation, small hiatal hernia.   ESOPHAGOGASTRODUODENOSCOPY (EGD) WITH PROPOFOL N/A 07/25/2020   Dr. Gala Romney: Oral candidiasis, likely early Candida  esophagitis, normal stomach and duodenum.  Esophagus dilated due to history of dysphagia.   FEMUR FRACTURE SURGERY Right    Rods placed   HEMORRHOID SURGERY     INNER EAR SURGERY Left    MALONEY DILATION N/A 11/21/2017   Procedure: Venia Minks DILATION;  Surgeon: Daneil Dolin, MD;  Location: [facility];  Service: Endoscopy;  Laterality: N/A;   MALONEY DILATION N/A 07/25/2020   Procedure: Venia Minks DILATION;  Surgeon: Daneil Dolin, MD;  Location: [facility];  Service: Endoscopy;  Laterality: N/A;   NASAL SINUS SURGERY     right hip surgery     [place]: "took my hip out due to MRSA" occurred after hip replacements   UMBILICAL HERNIA REPAIR N/A 01/01/2022   Procedure: HERNIA REPAIR UMBILICAL ADULT;  Surgeon: Virl Cagey, MD;  Location: [facility];  Service: General;  Laterality: N/A;    Prior to Admission medications   Medication Sig Start Date End Date Taking? Authorizing Provider  acetaminophen (TYLENOL) 325 MG tablet Take 650 mg by mouth every 8 (eight) hours as needed.   Yes [provider]  albuterol (VENTOLIN HFA) 108 (90 Base) MCG/ACT inhaler Inhale 2 puffs into the lungs every 4 (four) hours as needed for wheezing or shortness of breath. 05/30/20  Yes [provider]  Alogliptin Benzoate 25 MG TABS Take 25 mg by mouth daily.   Yes [provider]  aspirin EC 81 MG tablet Take 81 mg by mouth every  evening. Swallow whole.   Yes [provider]  Cholecalciferol (VITAMIN D) 50 MCG (2000 UT) tablet Take 2,000 Units by mouth daily.   Yes [provider]  Cranberry 450 MG TABS Take 450 mg by mouth every 12 (twelve) hours.   Yes [provider]  dextromethorphan-guaiFENesin (MUCINEX DM) 30-600 MG 12hr tablet Take 1 tablet by mouth daily as needed for cough.   Yes [provider]  DULoxetine (CYMBALTA) 60 MG capsule Take 60 mg by mouth daily.   Yes [provider]  fluticasone (FLONASE) 50 MCG/ACT nasal spray Place 1  spray into both nostrils at bedtime.   Yes [provider]  furosemide (LASIX) 40 MG tablet Take 40 mg by mouth daily.   Yes [provider]  gabapentin (NEURONTIN) 300 MG capsule Take 300 mg by mouth 2 (two) times daily.   Yes [provider]  Glucagon HCl 1 MG SOLR Inject 1 mg as directed daily as needed (blood sugar below 60 and unable to take oral glucose replacement).   Yes [provider]  insulin aspart (NOVOLOG) 100 UNIT/ML injection Inject 5 Units into the skin 3 (three) times daily before meals. Hold if blood glucose is below 200   Yes [provider]  insulin glargine (LANTUS) 100 UNIT/ML injection Inject 43 Units into the skin at bedtime.   Yes [provider]  ipratropium-albuterol (DUONEB) 0.5-2.5 (3) MG/3ML SOLN Take 3 mLs by nebulization every 6 (six) hours as needed (shortness of breath, coughing or wheezing).   Yes [provider]  levocetirizine (XYZAL) 5 MG tablet Take 5 mg by mouth daily at 12 noon.   Yes [provider]  lidocaine (LIDODERM) 5 % Place 2 patches onto the skin daily. Apply one patch to left shoulder and one patch to right hip at 8am. Remove both patches at 8pm.   Yes [provider]  LORazepam (ATIVAN) 0.5 MG tablet Take 0.5 mg by mouth every 8 (eight) hours.   Yes [provider]  Melatonin 5 MG TABS Take 10 mg by mouth at bedtime.    Yes [provider]  montelukast (SINGULAIR) 10 MG tablet Take 10 mg by mouth at bedtime.   Yes [provider]  naloxone (NARCAN) nasal spray 4 mg/0.1 mL Place 1 spray into the nose See admin instructions. Spray 1 spray in nostril every 3 minutes as needed for overdose symptoms   Yes [provider]  ondansetron (ZOFRAN-ODT) 4 MG disintegrating tablet Take 1 tablet (4 mg total) by mouth 3 (three) times daily before meals. 05/08/22  Yes Mahala Menghini, PA-C  oxybutynin (DITROPAN-XL) 10 MG 24 hr tablet Take 10 mg by  mouth daily as needed (bladder spasms related to dysuria).   Yes [provider]  oxyCODONE-acetaminophen (PERCOCET) 10-325 MG tablet Take 1 tablet by mouth every 8 (eight) hours.   Yes [provider]  pantoprazole (PROTONIX) 40 MG tablet Take 1 tablet (40 mg total) by mouth 2 (two) times daily before a meal. 06/21/21  Yes Mahala Menghini, PA-C  Plecanatide (TRULANCE) 3 MG TABS Take 1 tablet by mouth daily.   Yes [provider]  Polyethyl Glycol-Propyl Glycol (SYSTANE) 0.4-0.3 % SOLN Apply 1 drop to eye 2 (two) times daily.   Yes [provider]  polyethylene glycol (MIRALAX / GLYCOLAX) 17 g packet Take 17 g by mouth 2 (two) times daily as needed. Mix and give with 4-8 oz. of fluid 05/08/22  Yes Mahala Menghini,  PA-C  potassium chloride (KLOR-CON) 10 MEQ tablet Take 10 mEq by mouth daily. 01/26/20  Yes [provider]  QUEtiapine (SEROQUEL) 100 MG tablet Take 100 mg by mouth at bedtime.   Yes [provider]  RESTASIS 0.05 % ophthalmic emulsion  09/07/22  Yes [provider]  senna (SENOKOT) 8.6 MG TABS tablet Take 1 tablet by mouth every 12 (twelve) hours.   Yes [provider]  Simethicone 125 MG CAPS Take 1 capsule (125 mg total) by mouth 3 (three) times daily with meals. 01/31/22  Yes Mahala Menghini, PA-C  STIOLTO RESPIMAT 2.5-2.5 MCG/ACT AERS SMARTSIG:2 Puff(s) Via Inhaler Daily 09/13/22  Yes [provider]  tizanidine (ZANAFLEX) 2 MG capsule Take 2 mg by mouth daily. 03/16/22  Yes [provider]  traZODone (DESYREL) 100 MG tablet Take 200 mg by mouth daily. In the afternoon for depression   Yes [provider]    Allergies as of 10/30/2022 - Review Complete 05/08/2022  Allergen Reaction Noted   Reglan [metoclopramide]  06/21/2021    Family History  Problem Relation Age of Onset   Cancer Father        unknown   Cancer Mother    Colon cancer Neg Hx     Social History   Socioeconomic  History   Marital status: Divorced    Spouse name: Not on file   Number of children: Not on file   Years of education: Not on file   Highest education level: Not on file  Occupational History   Not on file  Tobacco Use   Smoking status: Every Day    Packs/day: 1.00    Years: 50.00    Total pack years: 50.00    Types: Cigarettes   Smokeless tobacco: Never   Tobacco comments:    0.5PPD- 11/17/2020  Vaping Use   Vaping Use: Never used  Substance and Sexual Activity   Alcohol use: No   Drug use: No   Sexual activity: Not Currently    Birth control/protection: Post-menopausal  Other Topics Concern   Not on file  Social History Narrative   Not on file   Social Determinants of Health   Financial Resource Strain: Low Risk  (09/21/2020)   Overall Financial Resource Strain (CARDIA)    Difficulty of Paying Living Expenses: Not hard at all  Food Insecurity: No Food Insecurity (09/21/2020)   Hunger Vital Sign    Worried About Running Out of Food in the Last Year: Never true    Ran Out of Food in the Last Year: Never true  Transportation Needs: No Transportation Needs (09/21/2020)   PRAPARE - Hydrologist (Medical): No    Lack of Transportation (Non-Medical): No  Physical Activity: Inactive (09/21/2020)   Exercise Vital Sign    Days of Exercise per Week: 0 days    Minutes of Exercise per Session: 0 min  Stress: No Stress Concern Present (09/21/2020)   [place]    Feeling of Stress : Only a little  Social Connections: Socially Isolated (09/21/2020)   Social Connection and Isolation Panel [NHANES]    Frequency of Communication with Friends and Family: Twice a week    Frequency of Social Gatherings with Friends and Family: Never    Attends Religious Services: Never    Marine scientist or Organizations: No    Attends Archivist Meetings: Never    Marital Status: Divorced  Intimate Partner Violence: Not At Risk (09/21/2020)   Humiliation, Afraid, Rape, and Kick questionnaire    Fear of Current or Ex-Partner: No    Emotionally Abused: No    Physically Abused: No    Sexually Abused: No    Review of Systems: See HPI, otherwise negative ROS  Physical Exam: BP 121/77 (BP Location: Left Arm, Patient Position: Sitting, Cuff Size: Large)   Pulse 80   Temp 97.6 F (36.4 C) (Oral)   Ht 5' 2"$  (1.575 m)   Wt 228 lb 12.8 oz (103.8 kg) Comment: Facility reported 09/07/22  SpO2 96%   BMI 41.85 kg/m  General:   Alert, pleasant and conversant.  Appears disheveled.  Presents in a wheelchair.   Lungs:  Clear throughout to auscultation.   No wheezes, crackles, or rhonchi. No acute distress. Heart:  Regular rate and rhythm; no murmurs, clicks, rubs,  or gallops. Abdomen: Obese soft and nontender  Extremities: Brawny lower extremities  Impression/Plan: 67 year old lady debilitated, wheelchair-bound, skilled nursing facility resident with chronic GERD, gastroparesis and constipation  - likely secondary to diffuse GI motility disorder-multifactorial in etiology.  Constipation is doing very well now with addition of Trulance last year.  GERD well-controlled.  Nausea managed with ondansetron  Screening colonoscopy warranted.  She has declined on multiple occasions including today.  Recommendations:  We will continue Trulance 3 mg daily (dispense 90 with 3 refills)  Continue to use MiraLAX 17 g orally on a daily basis if needed for constipation  Ondansetron 8 mg ODT tablets take 1 under the tongue twice daily as needed for nausea (dispense 180 with 3 additional refills)  Continue Protonix or pantoprazole 40 mg twice daily (take 30 minutes before breakfast and supper)  It has been recommended over time and again today that she ought to have a colonoscopy.  If she decides to have one, we will be glad to set that up.  Patient just needs to let us know.  Office visit in  1 year.       Notice: This dictation was prepared with Dragon dictation along with smaller phrase technology. Any transcriptional errors that result from this process are unintentional and may not be corrected upon review.

## 2022-11-06 ENCOUNTER — Emergency Department (HOSPITAL_COMMUNITY): Payer: Medicare Other

## 2022-11-06 ENCOUNTER — Encounter (HOSPITAL_COMMUNITY): Payer: Self-pay

## 2022-11-06 ENCOUNTER — Emergency Department (HOSPITAL_COMMUNITY)
Admission: EM | Admit: 2022-11-06 | Discharge: 2022-11-07 | Disposition: A | Discharge status: Skilled Nursing Facility | Payer: Medicare Other | Attending: Emergency Medicine | Admitting: Emergency Medicine

## 2022-11-06 ENCOUNTER — Other Ambulatory Visit: Payer: Self-pay

## 2022-11-06 DIAGNOSIS — E1165 Type 2 diabetes mellitus with hyperglycemia: Secondary | ICD-10-CM | POA: Insufficient documentation

## 2022-11-06 DIAGNOSIS — I509 Heart failure, unspecified: Secondary | ICD-10-CM | POA: Diagnosis not present

## 2022-11-06 DIAGNOSIS — Z7951 Long term (current) use of inhaled steroids: Secondary | ICD-10-CM | POA: Insufficient documentation

## 2022-11-06 DIAGNOSIS — N950 Postmenopausal bleeding: Secondary | ICD-10-CM | POA: Insufficient documentation

## 2022-11-06 DIAGNOSIS — J449 Chronic obstructive pulmonary disease, unspecified: Secondary | ICD-10-CM | POA: Insufficient documentation

## 2022-11-06 DIAGNOSIS — Z794 Long term (current) use of insulin: Secondary | ICD-10-CM | POA: Diagnosis not present

## 2022-11-06 DIAGNOSIS — Z7982 Long term (current) use of aspirin: Secondary | ICD-10-CM | POA: Diagnosis not present

## 2022-11-06 DIAGNOSIS — R102 Pelvic and perineal pain: Secondary | ICD-10-CM | POA: Diagnosis present

## 2022-11-06 LAB — COMPREHENSIVE METABOLIC PANEL
ALT: 10 U/L (ref 0–44)
AST: 15 U/L (ref 15–41)
Albumin: 3.5 g/dL (ref 3.5–5.0)
Alkaline Phosphatase: 62 U/L (ref 38–126)
Anion gap: 10 (ref 5–15)
BUN: 9 mg/dL (ref 8–23)
CO2: 26 mmol/L (ref 22–32)
Calcium: 8.8 mg/dL — ABNORMAL LOW (ref 8.9–10.3)
Chloride: 100 mmol/L (ref 98–111)
Creatinine, Ser: 0.68 mg/dL (ref 0.44–1.00)
GFR, Estimated: >60 mL/min (ref >60)
Glucose, Bld: 156 mg/dL — ABNORMAL HIGH (ref 70–99)
Potassium: 3.7 mmol/L (ref 3.5–5.1)
Sodium: 136 mmol/L (ref 135–145)
Total Bilirubin: 0.2 mg/dL — ABNORMAL LOW (ref 0.3–1.2)
Total Protein: 6.6 g/dL (ref 6.5–8.1)

## 2022-11-06 LAB — LIPASE, BLOOD: Lipase: 26 U/L (ref 11–51)

## 2022-11-06 LAB — CBC WITH DIFFERENTIAL/PLATELET
Abs Immature Granulocytes: 0.05 10*3/uL (ref 0.00–0.07)
Basophils Absolute: 0 10*3/uL (ref 0.0–0.1)
Basophils Relative: 0 %
Eosinophils Absolute: 0.2 10*3/uL (ref 0.0–0.5)
Eosinophils Relative: 1 %
HCT: 39.1 % (ref 36.0–46.0)
Hemoglobin: 12.7 g/dL (ref 12.0–15.0)
Immature Granulocytes: 0 %
Lymphocytes Relative: 27 %
Lymphs Abs: 3.2 10*3/uL (ref 0.7–4.0)
MCH: 28.1 pg (ref 26.0–34.0)
MCHC: 32.5 g/dL (ref 30.0–36.0)
MCV: 86.5 fL (ref 80.0–100.0)
Monocytes Absolute: 0.5 10*3/uL (ref 0.1–1.0)
Monocytes Relative: 4 %
Neutro Abs: 7.9 10*3/uL — ABNORMAL HIGH (ref 1.7–7.7)
Neutrophils Relative %: 68 %
Platelets: 233 10*3/uL (ref 150–400)
RBC: 4.52 MIL/uL (ref 3.87–5.11)
RDW: 15.9 % — ABNORMAL HIGH (ref 11.5–15.5)
WBC: 11.9 10*3/uL — ABNORMAL HIGH (ref 4.0–10.5)
nRBC: 0 % (ref 0.0–0.2)

## 2022-11-06 LAB — CBG MONITORING, ED: Glucose-Capillary: 166 mg/dL — ABNORMAL HIGH (ref 70–99)

## 2022-11-06 MED ORDER — MELATONIN 5 MG PO TABS
10.0000 mg | ORAL_TABLET | Freq: Every day | ORAL | Status: DC
  Filled 2022-11-06: qty 2

## 2022-11-06 MED ORDER — MORPHINE SULFATE (PF) 4 MG/ML IV SOLN
4.0000 mg | Freq: Once | INTRAVENOUS | Status: AC
  Administered 2022-11-06: 4 mg via INTRAVENOUS
  Filled 2022-11-06: qty 1

## 2022-11-06 MED ORDER — ALBUTEROL SULFATE HFA 108 (90 BASE) MCG/ACT IN AERS
2.0000 | INHALATION_SPRAY | RESPIRATORY_TRACT | Status: DC | PRN
  Administered 2022-11-07: 2 via RESPIRATORY_TRACT
  Filled 2022-11-06: qty 6.7

## 2022-11-06 MED ORDER — LORAZEPAM 0.5 MG PO TABS
0.5000 mg | ORAL_TABLET | Freq: Once | ORAL | Status: AC
  Administered 2022-11-06: 0.5 mg via ORAL
  Filled 2022-11-06: qty 1

## 2022-11-06 MED ORDER — QUETIAPINE FUMARATE 100 MG PO TABS
100.0000 mg | ORAL_TABLET | Freq: Every day | ORAL | Status: DC
  Administered 2022-11-06: 100 mg via ORAL
  Filled 2022-11-06: qty 1

## 2022-11-06 MED ORDER — NICOTINE 21 MG/24HR TD PT24
21.0000 mg | MEDICATED_PATCH | Freq: Once | TRANSDERMAL | Status: DC
  Administered 2022-11-06: 21 mg via TRANSDERMAL
  Filled 2022-11-06: qty 1

## 2022-11-06 MED ORDER — ONDANSETRON HCL 4 MG/2ML IJ SOLN
4.0000 mg | Freq: Once | INTRAMUSCULAR | Status: AC
  Administered 2022-11-06: 4 mg via INTRAVENOUS
  Filled 2022-11-06: qty 2

## 2022-11-06 MED ORDER — MELATONIN 3 MG PO TABS
9.0000 mg | ORAL_TABLET | Freq: Every day | ORAL | Status: DC
  Administered 2022-11-06: 9 mg via ORAL
  Filled 2022-11-06: qty 3

## 2022-11-06 MED ORDER — OXYCODONE-ACETAMINOPHEN 5-325 MG PO TABS
1.0000 | ORAL_TABLET | Freq: Once | ORAL | Status: AC
  Administered 2022-11-07: 1 via ORAL
  Filled 2022-11-06: qty 1

## 2022-11-06 MED ORDER — IOHEXOL 300 MG/ML  SOLN
100.0000 mL | Freq: Once | INTRAMUSCULAR | Status: AC | PRN
  Administered 2022-11-06: 100 mL via INTRAVENOUS

## 2022-11-06 NOTE — ED Notes (Signed)
Notified [facility] of patient needing transportation back to [facility].

## 2022-11-06 NOTE — Discharge Instructions (Signed)
Your labs tonight are stable, however, your CT scan suggests that you may have a thickened lining of your uterus which can explain the vaginal bleeding you have seen.  You will need to have a gynecology evaluation and ultrasound to

## 2022-11-06 NOTE — ED Triage Notes (Signed)
Patient complains of abdominal cramping that started 5 days ago . She also says she has had some bright red clots coming out of her vaginal area over that time

## 2022-11-06 NOTE — ED Notes (Signed)
Patient transported to CT 

## 2022-11-06 NOTE — ED Notes (Signed)
Pt has been changed and cleaned up. Pt only had little blood in brief no clots were witnessed. Pt  has a purewick in and a clean brief.

## 2022-11-06 NOTE — ED Provider Notes (Signed)
[facility] Provider Note   CSN: [id number] Arrival date & time: 11/06/22  1714     History  Chief Complaint  Patient presents with   Abdominal Pain    Gloria Chapman is a 67 y.o. female with a history including GERD, hepatitis, COPD, CHF type 2 diabetes presenting for evaluation of low pelvic cramping along with vaginal bleeding which started 5 days ago.  She reports the bleeding started out light, she started passing some bright red clots and today she had significantly heavier bleeding until just prior to arrival.  She is postmenopausal, stating it had been many years since her last period.  She denies prior similar symptoms.  She denies fevers or chills, lightheadedness, back pain, unexplained weight loss.  She has had no treatment prior to arrival.  The history is provided by the patient.       Home Medications Prior to Admission medications   Medication Sig Start Date End Date Taking? Authorizing Provider  acetaminophen (TYLENOL) 325 MG tablet Take 650 mg by mouth every 8 (eight) hours as needed.    [provider]  albuterol (VENTOLIN HFA) 108 (90 Base) MCG/ACT inhaler Inhale 2 puffs into the lungs every 4 (four) hours as needed for wheezing or shortness of breath. 05/30/20   [provider]  Alogliptin Benzoate 25 MG TABS Take 25 mg by mouth daily.    [provider]  aspirin EC 81 MG tablet Take 81 mg by mouth every evening. Swallow whole.    [provider]  Cholecalciferol (VITAMIN D) 50 MCG (2000 UT) tablet Take 2,000 Units by mouth daily.    [provider]  Cranberry 450 MG TABS Take 450 mg by mouth every 12 (twelve) hours.    [provider]  dextromethorphan-guaiFENesin (MUCINEX DM) 30-600 MG 12hr tablet Take 1 tablet by mouth daily as needed for cough.    [provider]  DULoxetine (CYMBALTA) 60 MG capsule Take 60 mg by mouth daily.    [provider]   fluticasone (FLONASE) 50 MCG/ACT nasal spray Place 1 spray into both nostrils at bedtime.    [provider]  furosemide (LASIX) 40 MG tablet Take 40 mg by mouth daily.    [provider]  gabapentin (NEURONTIN) 300 MG capsule Take 300 mg by mouth 2 (two) times daily.    [provider]  Glucagon HCl 1 MG SOLR Inject 1 mg as directed daily as needed (blood sugar below 60 and unable to take oral glucose replacement).    [provider]  insulin aspart (NOVOLOG) 100 UNIT/ML injection Inject 5 Units into the skin 3 (three) times daily before meals. Hold if blood glucose is below 200    [provider]  insulin glargine (LANTUS) 100 UNIT/ML injection Inject 43 Units into the skin at bedtime.    [provider]  ipratropium-albuterol (DUONEB) 0.5-2.5 (3) MG/3ML SOLN Take 3 mLs by nebulization every 6 (six) hours as needed (shortness of breath, coughing or wheezing).    [provider]  levocetirizine (XYZAL) 5 MG tablet Take 5 mg by mouth daily at 12 noon.    [provider]  lidocaine (LIDODERM) 5 % Place 2 patches onto the skin daily. Apply one patch to left shoulder and one patch to right hip at 8am. Remove both patches at 8pm.    [provider]  LORazepam (ATIVAN) 0.5 MG tablet Take 0.5 mg by mouth every 8 (eight) hours.  [provider]  Melatonin 5 MG TABS Take 10 mg by mouth at bedtime.     [provider]  montelukast (SINGULAIR) 10 MG tablet Take 10 mg by mouth at bedtime.    [provider]  naloxone [facility]) nasal spray 4 mg/0.1 mL Place 1 spray into the nose See admin instructions. Spray 1 spray in nostril every 3 minutes as needed for overdose symptoms    [provider]  ondansetron (ZOFRAN-ODT) 4 MG disintegrating tablet Take 1 tablet (4 mg total) by mouth 3 (three) times daily before meals. 10/30/22   Rourk, Cristopher Estimable, MD  oxybutynin (DITROPAN-XL) 10 MG 24 hr tablet Take 10  mg by mouth daily as needed (bladder spasms related to dysuria).    [provider]  oxyCODONE-acetaminophen (PERCOCET) 10-325 MG tablet Take 1 tablet by mouth every 8 (eight) hours.    [provider]  pantoprazole (PROTONIX) 40 MG tablet Take 1 tablet (40 mg total) by mouth 2 (two) times daily before a meal. 06/21/21   Bobby Rumpf, Laureen Ochs, PA-C  Plecanatide (TRULANCE) 3 MG TABS Take 1 tablet by mouth daily.    [provider]  Polyethyl Glycol-Propyl Glycol (SYSTANE) 0.4-0.3 % SOLN Apply 1 drop to eye 2 (two) times daily.    [provider]  polyethylene glycol (MIRALAX / GLYCOLAX) 17 g packet Take 17 g by mouth 2 (two) times daily as needed. Mix and give with 4-8 oz. of fluid 05/08/22   Mahala Menghini, PA-C  potassium chloride (KLOR-CON) 10 MEQ tablet Take 10 mEq by mouth daily. 01/26/20   [provider]  QUEtiapine (SEROQUEL) 100 MG tablet Take 100 mg by mouth at bedtime.    [provider]  RESTASIS 0.05 % ophthalmic emulsion  09/07/22   [provider]  senna (SENOKOT) 8.6 MG TABS tablet Take 1 tablet by mouth every 12 (twelve) hours.    [provider]  Simethicone 125 MG CAPS Take 1 capsule (125 mg total) by mouth 3 (three) times daily with meals. 01/31/22   Mahala Menghini, PA-C  STIOLTO RESPIMAT 2.5-2.5 MCG/ACT AERS SMARTSIG:2 Puff(s) Via Inhaler Daily 09/13/22   [provider]  tizanidine (ZANAFLEX) 2 MG capsule Take 2 mg by mouth daily. 03/16/22   [provider]  traZODone (DESYREL) 100 MG tablet Take 200 mg by mouth daily. In the afternoon for depression    [provider]      Allergies    Reglan [metoclopramide]    Review of Systems   Review of Systems  Constitutional:  Negative for chills and fever.  HENT:  Negative for congestion and sore throat.   Eyes: Negative.   Respiratory:  Negative for chest tightness and shortness of breath.   Cardiovascular:  Negative for chest pain.   Gastrointestinal:  Positive for abdominal pain. Negative for nausea and vomiting.  Genitourinary:  Positive for vaginal bleeding. Negative for dysuria.  Musculoskeletal:  Negative for arthralgias, joint swelling and neck pain.  Skin: Negative.  Negative for rash and wound.  Neurological:  Negative for dizziness, weakness, light-headedness, numbness and headaches.  Psychiatric/Behavioral: Negative.    All other systems reviewed and are negative.   Physical Exam Updated Vital Signs BP (!) 142/82   Pulse 81   Temp 98.7 F (37.1 C) (Oral)   Resp 20   Ht 5' 2"$  (1.575 m)   Wt 99.8 kg   SpO2 93%   BMI 40.24 kg/m  Physical Exam Vitals and nursing note reviewed.  Exam conducted with a chaperone present.  Constitutional:      Appearance: She is well-developed.  HENT:     Head: Normocephalic and atraumatic.  Eyes:     Conjunctiva/sclera: Conjunctivae normal.  Cardiovascular:     Rate and Rhythm: Normal rate and regular rhythm.     Heart sounds: Normal heart sounds.  Pulmonary:     Effort: Pulmonary effort is normal.     Breath sounds: Normal breath sounds. No wheezing.  Abdominal:     General: Bowel sounds are normal.     Palpations: Abdomen is soft.     Tenderness: There is no abdominal tenderness. There is no guarding or rebound.  Genitourinary:    Comments: No active vaginal bleeding or discharge. Musculoskeletal:        General: Normal range of motion.     Cervical back: Normal range of motion.  Skin:    General: Skin is warm and dry.  Neurological:     Mental Status: She is alert.     ED Results / Procedures / Treatments   Labs (all labs ordered are listed, but only abnormal results are displayed) Labs Reviewed  COMPREHENSIVE METABOLIC PANEL - Abnormal; Notable for the following components:      Result Value   Glucose, Bld 156 (*)    Calcium 8.8 (*)    Total Bilirubin 0.2 (*)    All other components within normal limits  CBC WITH DIFFERENTIAL/PLATELET -  Abnormal; Notable for the following components:   WBC 11.9 (*)    RDW 15.9 (*)    Neutro Abs 7.9 (*)    All other components within normal limits  CBG MONITORING, ED - Abnormal; Notable for the following components:   Glucose-Capillary 166 (*)    All other components within normal limits  LIPASE, BLOOD  URINALYSIS, ROUTINE W REFLEX MICROSCOPIC    EKG None  Radiology CT ABDOMEN PELVIS W CONTRAST  Result Date: 11/06/2022 CLINICAL DATA:  Pelvic pain, acute, post-menopausal new vaginal bleeding EXAM: CT ABDOMEN AND PELVIS WITH CONTRAST TECHNIQUE: Multidetector CT imaging of the abdomen and pelvis was performed using the standard protocol following bolus administration of intravenous contrast. RADIATION DOSE REDUCTION: This exam was performed according to the departmental dose-optimization program which includes automated exposure control, adjustment of the mA and/or kV according to patient size and/or use of iterative reconstruction technique. CONTRAST:  137m OMNIPAQUE IOHEXOL 300 MG/ML  SOLN COMPARISON:  CT abdomen pelvis 12/08/2021 FINDINGS: Lower chest: Left lower lobe linear atelectasis versus scarring. Hepatobiliary: No focal liver abnormality. Status post cholecystectomy. No biliary dilatation. Pancreas: Diffusely atrophic. No focal lesion. Otherwise normal pancreatic contour. No surrounding inflammatory changes. No main pancreatic ductal dilatation. Spleen: Normal in size without focal abnormality. Adrenals/Urinary Tract: No adrenal nodule bilaterally. Bilateral kidneys enhance symmetrically. No hydronephrosis. No hydroureter. The urinary bladder is unremarkable. Stomach/Bowel: Stomach is within normal limits. No evidence of bowel wall thickening or dilatation. Vascular/Lymphatic: No abdominal aorta or iliac aneurysm. Mild atherosclerotic plaque of the aorta and its branches. No abdominal, pelvic, or inguinal lymphadenopathy. Reproductive: Thickened endometrium measuring up to 2 cm. Otherwise  uterus and bilateral adnexa are unremarkable. Other: No intraperitoneal free fluid. No intraperitoneal free gas. No organized fluid collection. Musculoskeletal: No abdominal wall hernia or abnormality. No suspicious lytic or blastic osseous lesions. No acute displaced fracture. Surgically removed right femoral head. Degenerative changes of the proximal femur. Degenerative changes of the right acetabula. L4-S1 posterolateral surgical hardware. Chronic appearing in stable L1-L3 compression fractures. IMPRESSION: 1.  Thickened endometrium. Underlying malignancy not excluded. Recommend gynecologic consultation and pelvic ultrasound for further evaluation. 2.  Aortic Atherosclerosis (ICD10-I70.0). Electronically Signed   By: Iven Finn M.D.   On: 11/06/2022 22:15    Procedures Procedures    Medications Ordered in ED Medications  nicotine (NICODERM CQ - dosed in mg/24 hours) patch 21 mg (21 mg Transdermal Patch Applied 11/06/22 2235)  QUEtiapine (SEROQUEL) tablet 100 mg (100 mg Oral Given by Other 11/06/22 2336)  melatonin tablet 9 mg (9 mg Oral Given by Other 11/06/22 2336)  oxyCODONE-acetaminophen (PERCOCET/ROXICET) 5-325 MG per tablet 1 tablet (has no administration in time range)  albuterol (VENTOLIN HFA) 108 (90 Base) MCG/ACT inhaler 2 puff (has no administration in time range)  ondansetron (ZOFRAN) injection 4 mg (4 mg Intravenous Given 11/06/22 1953)  morphine (PF) 4 MG/ML injection 4 mg (4 mg Intravenous Given 11/06/22 1955)  iohexol (OMNIPAQUE) 300 MG/ML solution 100 mL (100 mLs Intravenous Contrast Given 11/06/22 2144)  morphine (PF) 4 MG/ML injection 4 mg (4 mg Intravenous Given 11/06/22 2234)  LORazepam (ATIVAN) tablet 0.5 mg (0.5 mg Oral Given by Other 11/06/22 2336)    ED Course/ Medical Decision Making/ A&P                             Medical Decision Making Patient presenting with postmenopausal vaginal bleeding, concerning for possible malignancy.  CT imaging confirms a thickened  endometrium.  Her lab results today are reassuring, she has a normal hemoglobin level of 12.7.  She will need outpatient GYN consult, this was discussed with patient who understands and agrees with this plan, she has been referred to Dr. Elonda Husky.    Amount and/or Complexity of Data Reviewed Labs: ordered.    Details: Reviewed, unremarkable. Radiology: ordered.    Details: CT results per above.           Final Clinical Impression(s) / ED Diagnoses Final diagnoses:  Postmenopausal vaginal bleeding    Rx / DC Orders ED Discharge Orders     None         Landis Martins 11/06/22 2353    Godfrey Pick, MD 11/08/22 [id number]

## 2022-11-07 DIAGNOSIS — N950 Postmenopausal bleeding: Secondary | ICD-10-CM | POA: Diagnosis not present

## 2022-11-20 ENCOUNTER — Encounter: Payer: Self-pay | Admitting: Adult Health

## 2022-11-20 ENCOUNTER — Ambulatory Visit (INDEPENDENT_AMBULATORY_CARE_PROVIDER_SITE_OTHER): Payer: Medicare Other | Admitting: Adult Health

## 2022-11-20 ENCOUNTER — Other Ambulatory Visit (HOSPITAL_COMMUNITY)
Admission: RE | Admit: 2022-11-20 | Discharge: 2022-11-20 | Disposition: A | Discharge status: Home / Self Care | Payer: Medicare Other | Source: Ambulatory Visit | Attending: Adult Health | Admitting: Adult Health

## 2022-11-20 VITALS — BP 131/79 | HR 83 | Ht 62.0 in | Wt 226.0 lb

## 2022-11-20 DIAGNOSIS — N950 Postmenopausal bleeding: Secondary | ICD-10-CM | POA: Diagnosis not present

## 2022-11-20 DIAGNOSIS — Z01419 Encounter for gynecological examination (general) (routine) without abnormal findings: Secondary | ICD-10-CM | POA: Insufficient documentation

## 2022-11-20 DIAGNOSIS — Z124 Encounter for screening for malignant neoplasm of cervix: Secondary | ICD-10-CM

## 2022-11-20 DIAGNOSIS — R9389 Abnormal findings on diagnostic imaging of other specified body structures: Secondary | ICD-10-CM | POA: Insufficient documentation

## 2022-11-20 DIAGNOSIS — Z1151 Encounter for screening for human papillomavirus (HPV): Secondary | ICD-10-CM | POA: Insufficient documentation

## 2022-11-20 NOTE — Progress Notes (Signed)
  Subjective:     Patient ID: Gloria Chapman, female   DOB: 01-07-1956, 67 y.o.   MRN: [id number]  HPI Gloria Chapman is a 67 year old white female,divorced, PM in for PMB and cramping, it started about 11/01/22 and was seen at [place], CT was done: Reproductive: Thickened endometrium measuring up to 2 cm. Otherwise uterus and bilateral adnexa are unremarkable.  IMPRESSION: 1. Thickened endometrium. Underlying malignancy not excluded. Recommend gynecologic consultation and pelvic ultrasound for further evaluation. 2.  Aortic Atherosclerosis (ICD10-I70.0 She resides at [facility].  PCP is Dr Judi Cong   Review of Systems +Vaginal bleeding with cramping since about 11/01/22 was seen in ER at [facility] No sexually active  Reviewed past medical,surgical, social and family history. Reviewed medications and allergies.     Objective:   Physical Exam BP 131/79 (BP Location: Right Arm, Patient Position: Sitting, Cuff Size: Large)   Pulse 83   Ht '5\' 2"'$  (1.575 m)   Wt 226 lb (102.5 kg)   BMI 41.34 kg/m     Skin warm and dry.Pelvic: external genitalia is normal in appearance no lesions, vagina:+pink discharge,urethra has no lesions or masses noted, cervix:was not visualized but pap was performed by blind sweep, for HR HPV genotyping, uterus: difficult to assess due to body habitus, non tender, no masses felt, adnexa: no masses or tenderness noted. Bladder is non tender and no masses felt.  Fall risk is low, but she is in wheelchair   [place] - 11/20/22 1036       Pregnancy Intention Screening   Does the patient want to become pregnant in the next year? N/A    Does the patient's partner want to become pregnant in the next year? N/A    Would the patient like to discuss contraceptive options today? N/A      Contraception Wrap Up   Current Method Abstinence;No Method - Other Reason   postmenopausal   Reason for No Current Contraceptive Method at Intake ([facility] Only) Other    End Method Abstinence;No  Method - Other Reason   postmenopausal   Contraception Counseling Provided No             Examination chaperoned by Levy Pupa LPN  Assessment:     . 1. PMB (postmenopausal bleeding) Has had bleeding and cramping since about 11/01/22 or so was seen in ER 11/06/22  2. Thickened endometrium Endometrium was 2 cm on CT Will get pelvic US in office to assess lining better If thickened will get endometrial biopsy with Dr Wynona Neat prefers female provider) to rule out endometrial cancer She says she wants a hysterectomy does not want to bleed and cramp    3. Routine Papanicolaou smear Pap sent Pap in 3 years if normal    Plan:     Return 11/29/22 for pelvic US in office

## 2022-11-23 LAB — CYTOLOGY - PAP
Adequacy: ABSENT
Comment: NEGATIVE
Diagnosis: NEGATIVE
Diagnosis: REACTIVE
High risk HPV: NEGATIVE

## 2022-11-29 ENCOUNTER — Ambulatory Visit (INDEPENDENT_AMBULATORY_CARE_PROVIDER_SITE_OTHER): Payer: Medicare Other

## 2022-11-29 DIAGNOSIS — N950 Postmenopausal bleeding: Secondary | ICD-10-CM | POA: Diagnosis not present

## 2022-11-29 DIAGNOSIS — R9389 Abnormal findings on diagnostic imaging of other specified body structures: Secondary | ICD-10-CM

## 2022-11-29 NOTE — Progress Notes (Signed)
PELVIC US TA/TV: homogeneous retroverted uterus,WNL,heterogenous thickened endometrium with color flow 13.6 mm,normal left ovary,right oophorectomy,right adnexa WNL,no free fluid,pt had discomfort during ultrasound   Chaperone Peggy

## 2022-11-30 ENCOUNTER — Telehealth: Payer: Self-pay | Admitting: Adult Health

## 2022-11-30 DIAGNOSIS — R9389 Abnormal findings on diagnostic imaging of other specified body structures: Secondary | ICD-10-CM

## 2022-11-30 NOTE — Telephone Encounter (Signed)
I called the [facility] and spoke with Coralyn Mark, she will call office Monday and get endometrial biopsy appt scheduled with Dr Nelda Marseille.  Gloria Chapman's endometrium was thickened at 13.6 mm

## 2022-12-26 ENCOUNTER — Encounter: Payer: Self-pay | Admitting: Obstetrics & Gynecology

## 2022-12-26 ENCOUNTER — Ambulatory Visit (INDEPENDENT_AMBULATORY_CARE_PROVIDER_SITE_OTHER): Payer: Medicare Other | Admitting: Obstetrics & Gynecology

## 2022-12-26 ENCOUNTER — Other Ambulatory Visit (HOSPITAL_COMMUNITY)
Admission: RE | Admit: 2022-12-26 | Discharge: 2022-12-26 | Disposition: A | Discharge status: Home / Self Care | Payer: Medicare Other | Source: Ambulatory Visit | Attending: Obstetrics & Gynecology | Admitting: Obstetrics & Gynecology

## 2022-12-26 VITALS — BP 135/90 | HR 82

## 2022-12-26 DIAGNOSIS — Z7409 Other reduced mobility: Secondary | ICD-10-CM | POA: Diagnosis not present

## 2022-12-26 DIAGNOSIS — Z993 Dependence on wheelchair: Secondary | ICD-10-CM | POA: Diagnosis not present

## 2022-12-26 DIAGNOSIS — Z720 Tobacco use: Secondary | ICD-10-CM

## 2022-12-26 DIAGNOSIS — R9389 Abnormal findings on diagnostic imaging of other specified body structures: Secondary | ICD-10-CM

## 2022-12-26 DIAGNOSIS — N950 Postmenopausal bleeding: Secondary | ICD-10-CM | POA: Insufficient documentation

## 2022-12-26 MED ORDER — MEGESTROL ACETATE 40 MG PO TABS: 40.0000 mg | ORAL_TABLET | Freq: Every day | ORAL | 6 refills | Status: AC

## 2022-12-26 NOTE — Progress Notes (Signed)
GYN VISIT Patient name: Gloria Chapman MRN [id number]  Date of birth: Aug 03, 1956 Chief Complaint:   Procedure (Endo bx)  History of Present Illness:   Gloria Chapman is a 67 y.o. [phone] PM female being seen today for the following concerns:  -PMB: Pt notes bleeding x 3 weeks- BRB like a period though denies bleeding currently.  Seems like bleeding comes/goes.  She also notes considerable pelvic pain and discomfort.     [place] completed: 5cm uterus- 43cc, thickened lining 43mm thickened with color flow.  Prior right oophorectomy, normal left ovary.  Pt denies difficulty breathing- however gasping/agonal respirations noted.  Pt wheelchair dependent- notes she "doesn't have a left hip."  No LMP recorded. Patient is postmenopausal.     09/21/2020    2:54 PM  Depression screen PHQ 2/9  Decreased Interest 1  Down, Depressed, Hopeless 1  PHQ - 2 Score 2  Altered sleeping 1  Tired, decreased energy 1  Change in appetite 1  Feeling bad or failure about yourself  0  Trouble concentrating 1  Moving slowly or fidgety/restless 0  Suicidal thoughts 0  PHQ-9 Score 6     Review of Systems:   Pertinent items are noted in HPI Denies fever/chills, dizziness, headaches, visual disturbances, fatigue, shortness of breath, chest pain. Pertinent History Reviewed:  Reviewed past medical,surgical, social, obstetrical and family history.  Reviewed problem list, medications and allergies. Physical Assessment:   Vitals:   12/26/22 1400  BP: (!) 135/90  Pulse: 82  There is no height or weight on file to calculate BMI.       Physical Examination:   General appearance: alert, well appearing, and in no distress  Psych: mood appropriate, normal affect  Skin: warm & dry   Cardiovascular: normal heart rate noted  Respiratory: normal respiratory effort, no distress  Abdomen: obese, soft, non-tender   Pelvic: Exam challenging due to patient's limited mobility and redundant posterior vaginal vault.   Cervix only visualized after using tenaculum. VULVA: normal appearing vulva with no masses, tenderness or lesions, VAGINA: Redundant atrophic vaginal tissue- some spotting noting with placement of larger speculum.  Tenaculum used to visualize cervix.  See procedure note below for EMB  Extremities: 2+ edema with brawning  Chaperone: Faith Rogue    Endometrial Biopsy Procedure Note  Pre-operative Diagnosis: Postmenopausal bleeding, Thickened endometrium  Post-operative Diagnosis: same  Procedure Details  The risks (including infection, bleeding, pain, and uterine perforation) and benefits of the procedure were explained to the patient and Written informed consent was obtained.  Antibiotic prophylaxis against endocarditis was not indicated.  See above regarding pelvic exam.  Once a  single tooth tenaculum was applied to the anterior lip of the cervix for stabilization.  Betadine was used to clean the cervix.  A Pipelle endometrial aspirator was used to sample the endometrium.  Sample was sent for pathologic examination.  Pt noted considerable discomfort during exam and concern for inadequate sampling.  Condition: Stable  Complications: None   Assessment & Plan:  1) PMB, Thickened endometrium -Next step pending results of pathology.   -based on history and [place] concern for endometrial Ca -due to multiple medical co-morbidities pt is not an ideal candidate for surgery -for now plan to start on Megace daily -pt did not seem pleased with my plan of avoiding surgery if possible.  Discussed again that we would need to wait for results of biopsy.  Additionally, if desired, may pursue 2nd opinion -f/u in 4wks  Comorbidities: COPD,  Diabetes, ?heart failure- no recent ECHO in chart, wheelchair dependence with limited mobility, morbid obesity, tobacco use  Meds ordered this encounter  Medications   megestrol (MEGACE) 40 MG tablet    Sig: Take 1 tablet (40 mg total) by mouth daily.     Dispense:  30 tablet    Refill:  6    Return in about 4 weeks (around 01/23/2023) for Medication follow up.   Myna Hidalgo, DO Attending Obstetrician & Gynecologist, [facility] for [employer], [facility]

## 2022-12-28 LAB — SURGICAL PATHOLOGY

## 2023-01-30 ENCOUNTER — Ambulatory Visit: Payer: Medicare Other | Admitting: Obstetrics & Gynecology

## 2023-02-13 ENCOUNTER — Ambulatory Visit: Payer: Medicare Other | Admitting: Obstetrics & Gynecology

## 2023-03-19 ENCOUNTER — Ambulatory Visit (INDEPENDENT_AMBULATORY_CARE_PROVIDER_SITE_OTHER): Payer: Medicare Other | Admitting: Obstetrics & Gynecology

## 2023-03-19 ENCOUNTER — Encounter: Payer: Self-pay | Admitting: Obstetrics & Gynecology

## 2023-03-19 VITALS — BP 144/77 | HR 89

## 2023-03-19 DIAGNOSIS — N950 Postmenopausal bleeding: Secondary | ICD-10-CM | POA: Diagnosis not present

## 2023-03-19 MED ORDER — MEGESTROL ACETATE 40 MG PO TABS: 40.0000 mg | ORAL_TABLET | Freq: Every day | ORAL | 11 refills | Status: DC

## 2023-03-19 NOTE — Progress Notes (Signed)
Follow up appointment for response: On megestrol for PMB + negative endometrial biopsy  Chief Complaint  Patient presents with   Follow-up    Endometrial biopsy results, still bleeding today    Blood pressure (!) 144/77, pulse 89.  Reviewed her scan, notes and path report  Pt has continued to have bleeding, like light menstrual  We discovered she has not been getting her megestrol as ordered Unclear as to reason  I called her facility and gave a verbal order for it to be given daily  MEDS ordered this encounter: Meds ordered this encounter  Medications   megestrol (MEGACE) 40 MG tablet    Sig: Take 1 tablet (40 mg total) by mouth daily.    Dispense:  30 tablet    Refill:  11    I called as a verbal order to her facility    Orders for this encounter: No orders of the defined types were placed in this encounter.   Impression + Management Plan   ICD-10-CM   1. PMB (postmenopausal bleeding): ongoing was not receiving her ordered megestrol 40 daily, reordered  N95.0       Follow Up: Return if symptoms worsen or fail to improve.     All questions were answered.  Past Medical History:  Diagnosis Date   Anemia    Anxiety    Arthritis    COPD (chronic obstructive pulmonary disease) (HCC)    Depression    Diabetes mellitus without complication (HCC)    Dyspnea    Fibromyalgia    GERD (gastroesophageal reflux disease)    Headache    Heart failure (HCC)    Heart murmur    Hepatitis    History of Clostridium difficile infection    History of methicillin resistant staphylococcus aureus (MRSA)    Insomnia    Overactive bladder    Pneumonia    PONV (postoperative nausea and vomiting)     Past Surgical History:  Procedure Laterality Date   APPENDECTOMY     BACK SURGERY     twice   CHOLECYSTECTOMY N/A 09/20/2021   Procedure: LAPAROSCOPIC CHOLECYSTECTOMY;  Surgeon: Lucretia Roers, MD;  Location: [facility];  Service: General;  Laterality: N/A;   DIAGNOSTIC  LAPAROSCOPY     checking for endometriosis   ESOPHAGOGASTRODUODENOSCOPY (EGD) WITH PROPOFOL N/A 11/21/2017   Dr. Jena Gauss: Erosive reflux esophagitis, low-grade narrowing Schatzki ring status post dilation, small hiatal hernia.   ESOPHAGOGASTRODUODENOSCOPY (EGD) WITH PROPOFOL N/A 07/25/2020   Dr. Jena Gauss: Oral candidiasis, likely early Candida esophagitis, normal stomach and duodenum.  Esophagus dilated due to history of dysphagia.   FEMUR FRACTURE SURGERY Right    Rods placed   HEMORRHOID SURGERY     INNER EAR SURGERY Left    MALONEY DILATION N/A 11/21/2017   Procedure: Elease Hashimoto DILATION;  Surgeon: Corbin Ade, MD;  Location: [facility];  Service: Endoscopy;  Laterality: N/A;   MALONEY DILATION N/A 07/25/2020   Procedure: Elease Hashimoto DILATION;  Surgeon: Corbin Ade, MD;  Location: [facility];  Service: Endoscopy;  Laterality: N/A;   NASAL SINUS SURGERY     right hip surgery     [place]: "took my hip out due to MRSA" occurred after hip replacements   UMBILICAL HERNIA REPAIR N/A 01/01/2022   Procedure: HERNIA REPAIR UMBILICAL ADULT;  Surgeon: Lucretia Roers, MD;  Location: [facility];  Service: General;  Laterality: N/A;    OB History     Gravida  3   Para  1   Term  1   Preterm      AB  2   Living  1      SAB      IAB      Ectopic      Multiple      Live Births  1           Allergies  Allergen Reactions   Reglan [Metoclopramide]     Tardive dyskinesia per patient    Social History   Socioeconomic History   Marital status: Divorced    Spouse name: Not on file   Number of children: Not on file   Years of education: Not on file   Highest education level: Not on file  Occupational History   Not on file  Tobacco Use   Smoking status: Every Day    Packs/day: 1.00    Years: 50.00    Additional pack years: 0.00    Total pack years: 50.00    Types: Cigarettes   Smokeless tobacco: Never   Tobacco comments:    0.5PPD- 11/17/2020  Vaping Use   Vaping  Use: Never used  Substance and Sexual Activity   Alcohol use: No   Drug use: No   Sexual activity: Not Currently    Birth control/protection: Post-menopausal  Other Topics Concern   Not on file  Social History Narrative   Not on file   Social Determinants of Health   Financial Resource Strain: Low Risk  (09/21/2020)   Overall Financial Resource Strain (CARDIA)    Difficulty of Paying Living Expenses: Not hard at all  Food Insecurity: No Food Insecurity (09/21/2020)   Hunger Vital Sign    Worried About Running Out of Food in the Last Year: Never true    Ran Out of Food in the Last Year: Never true  Transportation Needs: No Transportation Needs (09/21/2020)   PRAPARE - Administrator, Civil Service (Medical): No    Lack of Transportation (Non-Medical): No  Physical Activity: Inactive (09/21/2020)   Exercise Vital Sign    Days of Exercise per Week: 0 days    Minutes of Exercise per Session: 0 min  Stress: No Stress Concern Present (09/21/2020)   [employer] of Occupational Health - Occupational Stress Questionnaire    Feeling of Stress : Only a little  Social Connections: Socially Isolated (09/21/2020)   Social Connection and Isolation Panel [NHANES]    Frequency of Communication with Friends and Family: Twice a week    Frequency of Social Gatherings with Friends and Family: Never    Attends Religious Services: Never    Database administrator or Organizations: No    Attends Engineer, structural: Never    Marital Status: Divorced    Family History  Problem Relation Age of Onset   Cancer Father        unknown   Cancer Mother    Colon cancer Neg Hx

## 2023-07-08 ENCOUNTER — Ambulatory Visit: Payer: Medicare Other | Admitting: Obstetrics & Gynecology

## 2023-08-07 ENCOUNTER — Ambulatory Visit: Payer: Medicare Other | Admitting: Obstetrics & Gynecology

## 2023-08-22 ENCOUNTER — Encounter: Payer: Self-pay | Admitting: Internal Medicine

## 2023-08-22 ENCOUNTER — Ambulatory Visit: Payer: Medicare Other | Admitting: Internal Medicine

## 2023-08-22 VITALS — BP 122/74 | HR 83 | Ht 62.0 in

## 2023-08-22 DIAGNOSIS — Z720 Tobacco use: Secondary | ICD-10-CM | POA: Insufficient documentation

## 2023-08-22 DIAGNOSIS — T84059A Periprosthetic osteolysis of unspecified internal prosthetic joint, initial encounter: Secondary | ICD-10-CM | POA: Insufficient documentation

## 2023-08-22 DIAGNOSIS — I872 Venous insufficiency (chronic) (peripheral): Secondary | ICD-10-CM | POA: Insufficient documentation

## 2023-08-22 DIAGNOSIS — M461 Sacroiliitis, not elsewhere classified: Secondary | ICD-10-CM | POA: Insufficient documentation

## 2023-08-22 DIAGNOSIS — H903 Sensorineural hearing loss, bilateral: Secondary | ICD-10-CM | POA: Insufficient documentation

## 2023-08-22 DIAGNOSIS — J439 Emphysema, unspecified: Secondary | ICD-10-CM | POA: Diagnosis not present

## 2023-08-22 DIAGNOSIS — M5030 Other cervical disc degeneration, unspecified cervical region: Secondary | ICD-10-CM | POA: Insufficient documentation

## 2023-08-22 DIAGNOSIS — F419 Anxiety disorder, unspecified: Secondary | ICD-10-CM | POA: Insufficient documentation

## 2023-08-22 DIAGNOSIS — J309 Allergic rhinitis, unspecified: Secondary | ICD-10-CM | POA: Insufficient documentation

## 2023-08-22 DIAGNOSIS — G4700 Insomnia, unspecified: Secondary | ICD-10-CM | POA: Insufficient documentation

## 2023-08-22 DIAGNOSIS — F17210 Nicotine dependence, cigarettes, uncomplicated: Secondary | ICD-10-CM

## 2023-08-22 DIAGNOSIS — H906 Mixed conductive and sensorineural hearing loss, bilateral: Secondary | ICD-10-CM | POA: Insufficient documentation

## 2023-08-22 DIAGNOSIS — M51379 Other intervertebral disc degeneration, lumbosacral region without mention of lumbar back pain or lower extremity pain: Secondary | ICD-10-CM | POA: Insufficient documentation

## 2023-08-22 DIAGNOSIS — M48061 Spinal stenosis, lumbar region without neurogenic claudication: Secondary | ICD-10-CM | POA: Insufficient documentation

## 2023-08-22 DIAGNOSIS — H8110 Benign paroxysmal vertigo, unspecified ear: Secondary | ICD-10-CM | POA: Insufficient documentation

## 2023-08-22 DIAGNOSIS — R059 Cough, unspecified: Secondary | ICD-10-CM | POA: Insufficient documentation

## 2023-08-22 DIAGNOSIS — R609 Edema, unspecified: Secondary | ICD-10-CM | POA: Insufficient documentation

## 2023-08-22 DIAGNOSIS — E785 Hyperlipidemia, unspecified: Secondary | ICD-10-CM | POA: Insufficient documentation

## 2023-08-22 DIAGNOSIS — F32A Depression, unspecified: Secondary | ICD-10-CM | POA: Insufficient documentation

## 2023-08-22 DIAGNOSIS — J449 Chronic obstructive pulmonary disease, unspecified: Secondary | ICD-10-CM | POA: Insufficient documentation

## 2023-08-22 MED ORDER — METHYLPREDNISOLONE ACETATE 80 MG/ML IJ SUSP
120.0000 mg | Freq: Once | INTRAMUSCULAR | Status: AC
  Administered 2023-08-22: 120 mg via INTRAMUSCULAR

## 2023-08-22 MED ORDER — SPIRIVA RESPIMAT 1.25 MCG/ACT IN AERS: 2.0000 | INHALATION_SPRAY | Freq: Every day | RESPIRATORY_TRACT | Status: AC

## 2023-08-22 NOTE — Progress Notes (Signed)
Gloria Chapman, female    DOB: [birth year]    MRN: [id number]   Brief patient profile:  17   yowf  active smoker with lots of respiratory problems as child then in 73s and dx with copd  referred to pulmonary clinic in [place]  08/22/2023 by Charlynne Pander  for doe self-propelling in hallway s/p pneumonia admit [facility]    Already eval by Dr Jayme Cloud in [place] office refused pfts or stop smoking / rec stiolto which pt says "they won't give me"   History of Present Illness  08/22/2023  Pulmonary/ 1st office eval/ Tullio Chausse / [place] [facility]  Chief Complaint  Patient presents with   Establish Care   Shortness of Breath  Dyspnea:  w/c bound  / no trouble transfers to w/c or bed, only doe when self propelling more than slow speed  Cough: congested worst in  am mucoid sputum mod vol  Sleep: no resp cc  SABA use: too frequent despite stiolto 02: none  No obvious day to day or daytime pattern/variability or assoc   purulent sputum or mucus plugs or hemoptysis or cp or chest tightness, subjective wheeze or overt sinus or hb symptoms.    Also denies any obvious fluctuation of symptoms with weather or environmental changes or other aggravating or alleviating factors except as outlined above   No unusual exposure hx or h/o childhood pna/ asthma or knowledge of premature birth.  Current Allergies, Complete Past Medical History, Past Surgical History, Family History, and Social History were reviewed in [employer] record.  ROS  The following are not active complaints unless bolded Hoarseness, sore throat, dysphagia, dental problems, itching, sneezing,  nasal congestion or discharge of excess mucus or purulent secretions, ear ache,   fever, chills, sweats, unintended wt loss or wt gain, classically pleuritic or exertional cp,  orthopnea pnd or arm/hand swelling  or leg swelling, presyncope, palpitations, abdominal pain, anorexia, nausea, vomiting, diarrhea  or change in bowel  habits or change in bladder habits, change in stools or change in urine, dysuria, hematuria,  rash, arthralgias, visual complaints, headache, numbness, weakness or ataxia or problems with walking or coordination,  change in mood or  memory.              Outpatient Medications Prior to Visit  Medication Sig Dispense Refill   albuterol (VENTOLIN HFA) 108 (90 Base) MCG/ACT inhaler Inhale 2 puffs into the lungs every 4 (four) hours as needed for wheezing or shortness of breath.     budesonide (PULMICORT) 0.5 MG/2ML nebulizer solution Take 0.5 mg by nebulization daily.     Cholecalciferol (VITAMIN D) 50 MCG (2000 UT) tablet Take 2,000 Units by mouth daily.     divalproex (DEPAKOTE) 500 MG DR tablet Take 500 mg by mouth 2 (two) times daily.     fluticasone (FLONASE) 50 MCG/ACT nasal spray Place 1 spray into both nostrils at bedtime.     formoterol (PERFOROMIST) 20 MCG/2ML nebulizer solution Take 20 mcg by nebulization 2 (two) times daily.     furosemide (LASIX) 20 MG tablet Take 20 mg by mouth daily.     gabapentin (NEURONTIN) 300 MG capsule Take 300 mg by mouth 2 (two) times daily.     insulin aspart (NOVOLOG) 100 UNIT/ML injection Inject 10 Units into the skin 3 (three) times daily before meals. Hold if blood glucose is below 200     insulin glargine-yfgn (SEMGLEE) 100 UNIT/ML Pen      insulin lispro (HUMALOG)  100 UNIT/ML KwikPen Inject into the skin.     levocetirizine (XYZAL) 5 MG tablet Take 5 mg by mouth daily at 12 noon.     LORazepam (ATIVAN) 0.5 MG tablet Take 0.5 mg by mouth every 8 (eight) hours.     losartan (COZAAR) 50 MG tablet Take 50 mg by mouth daily.     Melatonin 5 MG TABS Take 10 mg by mouth at bedtime.      montelukast (SINGULAIR) 10 MG tablet Take 10 mg by mouth at bedtime.     ondansetron (ZOFRAN) 4 MG tablet Take 4 mg by mouth every 8 (eight) hours as needed.     oxybutynin (DITROPAN) 5 MG tablet Take 5 mg by mouth daily.     oxyCODONE-acetaminophen (PERCOCET) 7.5-325 MG  tablet Take 1 tablet by mouth every 4 (four) hours as needed for severe pain.     pantoprazole (PROTONIX) 20 MG tablet Take 20 mg by mouth 2 (two) times daily before a meal.     Plecanatide (TRULANCE) 3 MG TABS Take 1 tablet by mouth daily.     polyethylene glycol (MIRALAX / GLYCOLAX) 17 g packet Take 17 g by mouth 2 (two) times daily as needed. Mix and give with 4-8 oz. of fluid 14 each    potassium chloride (KLOR-CON) 10 MEQ tablet Take 10 mEq by mouth daily.     QUEtiapine (SEROQUEL) 400 MG tablet Take 400 mg by mouth at bedtime.     senna (SENOKOT) 8.6 MG TABS tablet Take 1 tablet by mouth every 12 (twelve) hours.     STIOLTO RESPIMAT 2.5-2.5 MCG/ACT AERS SMARTSIG:2 Puff(s) Via Inhaler Daily     tiZANidine (ZANAFLEX) 2 MG tablet Take 2 mg by mouth at bedtime.     traZODone (DESYREL) 100 MG tablet Take 200 mg by mouth daily. In the afternoon for depression     clotrimazole-betamethasone (LOTRISONE) cream Apply 1 Application topically 2 (two) times daily.     Insulin Aspart FlexPen (NOVOLOG) 100 UNIT/ML      LANTUS SOLOSTAR 100 UNIT/ML Solostar Pen Inject into the skin.     acetaminophen (TYLENOL) 325 MG tablet Take 650 mg by mouth every 8 (eight) hours as needed. (Patient not taking: Reported on 03/19/2023)     aspirin EC 81 MG tablet Take 81 mg by mouth every evening. Swallow whole. (Patient not taking: Reported on 08/22/2023)     Glucagon HCl 1 MG SOLR Inject 1 mg as directed daily as needed (blood sugar below 60 and unable to take oral glucose replacement). (Patient not taking: Reported on 08/22/2023)     ipratropium-albuterol (DUONEB) 0.5-2.5 (3) MG/3ML SOLN Take 3 mLs by nebulization every 6 (six) hours as needed (shortness of breath, coughing or wheezing). (Patient not taking: Reported on 08/22/2023)     naloxone (NARCAN) nasal spray 4 mg/0.1 mL Place 1 spray into the nose See admin instructions. Spray 1 spray in nostril every 3 minutes as needed for overdose symptoms (Patient not taking:  Reported on 08/22/2023)     Alogliptin Benzoate 25 MG TABS Take 25 mg by mouth daily.     Cranberry 450 MG TABS Take 450 mg by mouth every 12 (twelve) hours.     DULoxetine (CYMBALTA) 60 MG capsule Take 60 mg by mouth daily.     furosemide (LASIX) 40 MG tablet Take 40 mg by mouth daily.     insulin glargine (LANTUS) 100 UNIT/ML injection Inject 43 Units into the skin at bedtime.     lidocaine (  LIDODERM) 5 % Place 2 patches onto the skin daily. Apply one patch to left shoulder and one patch to right hip at 8am. Remove both patches at 8pm.     megestrol (MEGACE) 40 MG tablet Take 1 tablet (40 mg total) by mouth daily. 30 tablet 11   ondansetron (ZOFRAN) 8 MG tablet Take by mouth every 8 (eight) hours as needed for nausea or vomiting.     ondansetron (ZOFRAN-ODT) 4 MG disintegrating tablet Take 1 tablet (4 mg total) by mouth 3 (three) times daily before meals. 180 tablet 3   oxybutynin (DITROPAN-XL) 10 MG 24 hr tablet Take 10 mg by mouth daily as needed (bladder spasms related to dysuria).     pantoprazole (PROTONIX) 40 MG tablet Take 1 tablet (40 mg total) by mouth 2 (two) times daily before a meal. 180 tablet 3   Polyethyl Glycol-Propyl Glycol (SYSTANE) 0.4-0.3 % SOLN Apply 1 drop to eye 2 (two) times daily.     potassium chloride (KLOR-CON) 10 MEQ tablet Take 10 mEq by mouth daily.     RESTASIS 0.05 % ophthalmic emulsion  (Patient not taking: Reported on 08/22/2023)     Simethicone 125 MG CAPS Take 1 capsule (125 mg total) by mouth 3 (three) times daily with meals. 90 capsule 4   tizanidine (ZANAFLEX) 2 MG capsule Take 2 mg by mouth daily.     No facility-administered medications prior to visit.    Past Medical History:  Diagnosis Date   Anemia    Anxiety    Arthritis    COPD (chronic obstructive pulmonary disease) (HCC)    Depression    Diabetes mellitus without complication (HCC)    Dyspnea    Fibromyalgia    GERD (gastroesophageal reflux disease)    Headache    Heart failure (HCC)     Heart murmur    Hepatitis    History of Clostridium difficile infection    History of methicillin resistant staphylococcus aureus (MRSA)    Insomnia    Overactive bladder    Pneumonia    PONV (postoperative nausea and vomiting)       Objective:     BP 122/74   Pulse 83   Ht 5\' 2"  (1.575 m)   SpO2 92%   BMI 41.34 kg/m   SpO2: 92 %RA  Gruff w/c bound obese wf nad   HEENT : Oropharynx  nl  Nasal turbinates nl    NECK :  without  apparent JVD/ palpable Nodes/TM    LUNGS: no acc muscle use,  Mild barrel  contour chest wall with bilateral distant insp/exp rhonchi and  without cough on insp or exp maneuvers  and mild  Hyperresonant  to  percussion bilaterally     CV:  RRR  no s3 or murmur or increase in P2, and no edema   ABD: obese   soft and nontender  MS:   ext warm without deformities Or obvious joint restrictions  calf tenderness, cyanosis or clubbing     SKIN: warm and dry without lesions    NEURO:  alert, approp, nl sensorium with  no motor or cerebellar deficits apparent.     I personally reviewed images and agree with radiology impression as follows:  CXR:   Nov 04 2021 portable No acute findings    Assessment   Chronic obstructive pulmonary disease (HCC) Active smoker/ clinical dx (refuses PFTs)  - 08/22/2023  After extensive coaching inhaler device,  effectiveness =    60% with [facility]  from a baseline near 0 - 08/22/2023 depomedrol 120 mg IM    ABC plan and f/u prn   She has mod copd / CB features and refuses further w/u or to consider stopping smoking "it's all I have left to enjoy"   so in this setting rec continue daily stiolto and more approp saba  Re SABA :  I spent extra time with pt today reviewing appropriate use of albuterol for prn use on exertion with the following points: 1) saba is for relief of sob that does not improve by walking a slower pace or resting but rather if the pt does not improve after trying this first. 2) If the pt is  convinced, as many are, that saba helps recover from activity faster then it's easy to tell if this is the case by re-challenging : ie stop, take the inhaler, then p 5 minutes try the exact same activity (intensity of workload) that just caused the symptoms and see if they are substantially diminished or not after saba 3) if there is an activity that reproducibly causes the symptoms, try the saba 15 min before the activity on alternate days   If in fact the saba really does help, then fine to continue to use it prn but advised may need to look closer at the maintenance regimen being used to achieve better control of airways disease with exertion.    If not doing well will need to return with an updated and accurate MAR reflecting her maint vs prn  The principal here is that until we are certain that  patients are actually taking what we've asked, it makes no sense to ask them to do more.          Each maintenance medication was reviewed in detail including emphasizing most importantly the difference between maintenance and prns and under what circumstances the prns are to be triggered using an action plan format where appropriate.  Total time for H and P, chart review, counseling, reviewing hfa/smi/neb device(s) and generating customized AVS unique to this office visit / same day charting  > 30 min pt new to me       Cigarette smoker 4-5 min discussion re active cigarette smoking in addition to office E&M  Ask about tobacco use:   ongoing  Advise quitting   should be very rapid improvement in QOL = chronic bronchitic component. I took an extended  opportunity with this patient to outline the consequences of continued cigarette use  in airway disorders based on all the data we have from the multiple national lung health studies (perfomed over decades at millions of dollars in cost)  indicating that smoking cessation, not choice of inhalers or pulmonary physicians, is the most important aspect of  her care.   Assess willingness:  Not committed at this point Assist in quit attempt:  Per PCP when ready Arrange follow up:   Follow up per Primary Care planned           Sandrea Hughs, MD 08/22/2023

## 2023-08-22 NOTE — Patient Instructions (Addendum)
Plan A = Automatic = Always=    Stiolto 2 puffs each  am (think of it high octane fuel)   Work on inhaler technique:  relax and gently blow all the way out then take a nice smooth full deep breath back in, triggering the inhaler at same time you start breathing in.  Hold breath in for at least  5 seconds if you can.      Work on inhaler technique:  relax and gently blow all the way out then take a nice smooth full deep breath back in, triggering the inhaler at same time you start breathing in.  Hold breath in for at least  5 seconds if you can.   Rinse and gargle with water when done.  If mouth or throat bother you at all,  try brushing teeth/gums/tongue with arm and hammer toothpaste/ make a slurry and gargle and spit out.     Plan B = Backup (to supplement plan A, not to replace it) Only use your albuterol inhaler as a rescue medication to be used if you can't catch your breath by resting or doing a relaxed purse lip breathing pattern.  - The less you use it, the better it will work when you need it. - Ok to use the inhaler up to 2 puffs  every 4 hours if you must but call for appointment if use goes up over your usual need - Don't leave home without it !!  (think of it like the spare tire for your car)     Plan C = Crisis (instead of Plan B but only if Plan B stops working) - only use your albuterol nebulizer if you first try Plan B and it fails to help > ok to use the nebulizer up to every 4 hours but if start needing it regularly call for immediate appointment  Depomedrol 120 mg  IM   I strongly recommend you have PFTS  and stop smoking

## 2023-08-23 DIAGNOSIS — F17210 Nicotine dependence, cigarettes, uncomplicated: Secondary | ICD-10-CM | POA: Insufficient documentation

## 2023-08-23 NOTE — Assessment & Plan Note (Addendum)
Active smoker/ clinical dx (refuses PFTs)  - 08/22/2023  After extensive coaching inhaler device,  effectiveness =    60% with SMI from a baseline near 0 - 08/22/2023 depomedrol 120 mg IM    ABC plan and f/u prn   She has mod copd / CB features and refuses further w/u or to consider stopping smoking "it's all I have left to enjoy"   so in this setting rec continue daily stiolto and more approp saba  Re SABA :  I spent extra time with pt today reviewing appropriate use of albuterol for prn use on exertion with the following points: 1) saba is for relief of sob that does not improve by walking a slower pace or resting but rather if the pt does not improve after trying this first. 2) If the pt is convinced, as many are, that saba helps recover from activity faster then it's easy to tell if this is the case by re-challenging : ie stop, take the inhaler, then p 5 minutes try the exact same activity (intensity of workload) that just caused the symptoms and see if they are substantially diminished or not after saba 3) if there is an activity that reproducibly causes the symptoms, try the saba 15 min before the activity on alternate days   If in fact the saba really does help, then fine to continue to use it prn but advised may need to look closer at the maintenance regimen being used to achieve better control of airways disease with exertion.    If not doing well will need to return with an updated and accurate MAR reflecting her maint vs prn  The principal here is that until we are certain that  patients are actually taking what we've asked, it makes no sense to ask them to do more.          Each maintenance medication was reviewed in detail including emphasizing most importantly the difference between maintenance and prns and under what circumstances the prns are to be triggered using an action plan format where appropriate.  Total time for H and P, chart review, counseling, reviewing hfa/smi/neb  device(s) and generating customized AVS unique to this office visit / same day charting  > 30 min pt new to me

## 2023-08-23 NOTE — Assessment & Plan Note (Signed)
4-5 min discussion re active cigarette smoking in addition to office E&M  Ask about tobacco use:   ongoing  Advise quitting   should be very rapid improvement in QOL = chronic bronchitic component. I took an extended  opportunity with this patient to outline the consequences of continued cigarette use  in airway disorders based on all the data we have from the multiple national lung health studies (perfomed over decades at millions of dollars in cost)  indicating that smoking cessation, not choice of inhalers or pulmonary physicians, is the most important aspect of her care.   Assess willingness:  Not committed at this point Assist in quit attempt:  Per PCP when ready Arrange follow up:   Follow up per Primary Care planned

## 2023-09-26 ENCOUNTER — Encounter: Payer: Self-pay | Admitting: Internal Medicine

## 2023-11-15 ENCOUNTER — Ambulatory Visit (INDEPENDENT_AMBULATORY_CARE_PROVIDER_SITE_OTHER): Payer: Medicare Other | Admitting: Nurse Practitioner

## 2023-11-15 ENCOUNTER — Encounter (INDEPENDENT_AMBULATORY_CARE_PROVIDER_SITE_OTHER): Payer: Self-pay | Admitting: Nurse Practitioner

## 2023-11-15 VITALS — BP 103/66 | HR 88 | Resp 18 | Ht 62.0 in | Wt 226.0 lb

## 2023-11-15 DIAGNOSIS — I10 Essential (primary) hypertension: Secondary | ICD-10-CM

## 2023-11-15 DIAGNOSIS — M51379 Other intervertebral disc degeneration, lumbosacral region without mention of lumbar back pain or lower extremity pain: Secondary | ICD-10-CM | POA: Diagnosis not present

## 2023-11-15 DIAGNOSIS — I739 Peripheral vascular disease, unspecified: Secondary | ICD-10-CM | POA: Diagnosis not present

## 2023-11-15 DIAGNOSIS — M7989 Other specified soft tissue disorders: Secondary | ICD-10-CM | POA: Diagnosis not present

## 2023-11-17 NOTE — Progress Notes (Signed)
 Subjective:    Patient ID: Gloria Chapman, female    DOB: [birth year], 68 y.o.   MRN: [id number] Chief Complaint  Patient presents with   New Patient (Initial Visit)    np. consult. Edema. Ref: Troutman,Elizabeth    Patient is seen for evaluation of leg swelling. The patient first noticed the swelling remotely but is now concerned because of a significant increase in the overall edema. The swelling isn't associated with significant pain.  There has been an increasing amount of  discoloration noted by the patient. The patient notes that in the morning the legs are improved but they steadily worsened throughout the course of the day. Elevation seems to make the swelling of the legs better, dependency makes them much worse.  The patient is mostly wheelchair-bound.  Currently she has no evidence of wounds or weeping but she notes that she has previously had weeping of the lower extremities before.    The patient denies any recent changes in their medications.  The patient has not been wearing graduated compression.  The patient has no had any past angiography, interventions or vascular surgery.  The patient denies a history of DVT or PE. There is no prior history of phlebitis. There is no history of primary lymphedema.  There is no history of radiation treatment to the groin or pelvis No history of malignancies. No history of trauma or groin or pelvic surgery. No history of foreign travel or parasitic infections area      Review of Systems  Cardiovascular:  Positive for leg swelling.  Skin:  Negative for wound.  Neurological:  Positive for weakness.  All other systems reviewed and are negative.      Objective:   Physical Exam Vitals reviewed.  HENT:     Head: Normocephalic.  Cardiovascular:     Rate and Rhythm: Normal rate.  Pulmonary:     Effort: Pulmonary effort is normal.  Musculoskeletal:     Right lower leg: No edema.     Left lower leg: Edema present.  Skin:     General: Skin is warm and dry.  Neurological:     Mental Status: She is alert and oriented to person, place, and time.  Psychiatric:        Mood and Affect: Mood normal.        Behavior: Behavior normal.        Thought Content: Thought content normal.        Judgment: Judgment normal.     BP 103/66   Pulse 88   Resp 18   Ht 5\' 2"  (1.575 m)   Wt 226 lb (102.5 kg)   BMI 41.34 kg/m   Past Medical History:  Diagnosis Date   Anemia    Anxiety    Arthritis    COPD (chronic obstructive pulmonary disease) (HCC)    Depression    Diabetes mellitus without complication (HCC)    Dyspnea    Fibromyalgia    GERD (gastroesophageal reflux disease)    Headache    Heart failure (HCC)    Heart murmur    Hepatitis    History of Clostridium difficile infection    History of methicillin resistant staphylococcus aureus (MRSA)    Insomnia    Overactive bladder    Pneumonia    PONV (postoperative nausea and vomiting)     Social History   Socioeconomic History   Marital status: Divorced    Spouse name: Not on file   Number of children:  Not on file   Years of education: Not on file   Highest education level: Not on file  Occupational History   Not on file  Tobacco Use   Smoking status: Every Day    Current packs/day: 1.00    Average packs/day: 1 pack/day for 50.0 years (50.0 ttl pk-yrs)    Types: Cigarettes   Smokeless tobacco: Never   Tobacco comments:    0.5PPD- 11/17/2020  Vaping Use   Vaping status: Never Used  Substance and Sexual Activity   Alcohol use: No   Drug use: No   Sexual activity: Not Currently    Birth control/protection: Post-menopausal  Other Topics Concern   Not on file  Social History Narrative   Not on file   Social Drivers of Health   Financial Resource Strain: Low Risk  (09/21/2020)   Overall Financial Resource Strain (CARDIA)    Difficulty of Paying Living Expenses: Not hard at all  Food Insecurity: No Food Insecurity (09/21/2020)   Hunger Vital  Sign    Worried About Running Out of Food in the Last Year: Never true    Ran Out of Food in the Last Year: Never true  Transportation Needs: No Transportation Needs (09/21/2020)   PRAPARE - Administrator, Civil Service (Medical): No    Lack of Transportation (Non-Medical): No  Physical Activity: Inactive (09/21/2020)   Exercise Vital Sign    Days of Exercise per Week: 0 days    Minutes of Exercise per Session: 0 min  Stress: No Stress Concern Present (09/21/2020)   [employer] of Occupational Health - Occupational Stress Questionnaire    Feeling of Stress : Only a little  Social Connections: Socially Isolated (09/21/2020)   Social Connection and Isolation Panel [NHANES]    Frequency of Communication with Friends and Family: Twice a week    Frequency of Social Gatherings with Friends and Family: Never    Attends Religious Services: Never    Database administrator or Organizations: No    Attends Banker Meetings: Never    Marital Status: Divorced  Catering manager Violence: Not At Risk (09/21/2020)   Humiliation, Afraid, Rape, and Kick questionnaire    Fear of Current or Ex-Partner: No    Emotionally Abused: No    Physically Abused: No    Sexually Abused: No    Past Surgical History:  Procedure Laterality Date   APPENDECTOMY     BACK SURGERY     twice   CHOLECYSTECTOMY N/A 09/20/2021   Procedure: LAPAROSCOPIC CHOLECYSTECTOMY;  Surgeon: Lucretia Roers, MD;  Location: [facility];  Service: General;  Laterality: N/A;   DIAGNOSTIC LAPAROSCOPY     checking for endometriosis   ESOPHAGOGASTRODUODENOSCOPY (EGD) WITH PROPOFOL N/A 11/21/2017   Dr. Jena Gauss: Erosive reflux esophagitis, low-grade narrowing Schatzki ring status post dilation, small hiatal hernia.   ESOPHAGOGASTRODUODENOSCOPY (EGD) WITH PROPOFOL N/A 07/25/2020   Dr. Jena Gauss: Oral candidiasis, likely early Candida esophagitis, normal stomach and duodenum.  Esophagus dilated due to history of dysphagia.   FEMUR  FRACTURE SURGERY Right    Rods placed   HEMORRHOID SURGERY     INNER EAR SURGERY Left    MALONEY DILATION N/A 11/21/2017   Procedure: Elease Hashimoto DILATION;  Surgeon: Corbin Ade, MD;  Location: [facility];  Service: Endoscopy;  Laterality: N/A;   MALONEY DILATION N/A 07/25/2020   Procedure: Elease Hashimoto DILATION;  Surgeon: Corbin Ade, MD;  Location: [facility];  Service: Endoscopy;  Laterality: N/A;  NASAL SINUS SURGERY     right hip surgery     [place]: "took my hip out due to MRSA" occurred after hip replacements   UMBILICAL HERNIA REPAIR N/A 01/01/2022   Procedure: HERNIA REPAIR UMBILICAL ADULT;  Surgeon: Lucretia Roers, MD;  Location: [facility];  Service: General;  Laterality: N/A;    Family History  Problem Relation Age of Onset   Cancer Father        unknown   Cancer Mother    Colon cancer Neg Hx     Allergies  Allergen Reactions   Reglan [Metoclopramide]     Tardive dyskinesia per patient       Latest Ref Rng & Units 11/06/2022    7:35 PM 11/04/2021    3:30 PM 09/15/2021    2:41 PM  CBC  WBC 4.0 - 10.5 K/uL 11.9  13.0  6.6   Hemoglobin 12.0 - 15.0 g/dL 16.1  09.6  04.5   Hematocrit 36.0 - 46.0 % 39.1  43.5  44.2   Platelets 150 - 400 K/uL 233  255  157       CMP     Component Value Date/Time   NA 136 11/06/2022 1935   K 3.7 11/06/2022 1935   CL 100 11/06/2022 1935   CO2 26 11/06/2022 1935   GLUCOSE 156 (H) 11/06/2022 1935   BUN 9 11/06/2022 1935   CREATININE 0.68 11/06/2022 1935   CALCIUM 8.8 (L) 11/06/2022 1935   PROT 6.6 11/06/2022 1935   ALBUMIN 3.5 11/06/2022 1935   AST 15 11/06/2022 1935   ALT 10 11/06/2022 1935   ALKPHOS 62 11/06/2022 1935   BILITOT 0.2 (L) 11/06/2022 1935   GFRNONAA >60 11/06/2022 1935     No results found.     Assessment & Plan:   1. Leg swelling (Primary) No surgery or intervention at this point in time.    I have had a long discussion with the patient regarding venous insufficiency and why it  causes symptoms,  specifically venous ulceration. I have discussed with the patient the chronic skin changes that accompany venous insufficiency and the long term sequela such as infection and recurring  ulceration.  Patient will be placed in [employer] which will be changed weekly drainage permitting.  In addition, behavioral modification including several periods of elevation of the lower extremities during the day will be continued. Achieving a position with the ankles at heart level was stressed to the patient  The patient is instructed to begin routine exercise, especially walking on a daily basis  In the future the patient can be assessed for graduated compression stockings or wraps as well as a Lymph Pump once the ulcers are healed.  2. PAD (peripheral artery disease) (HCC) In the patient's referral packet there are studies that indicate that she likely has evidence of aortoiliac level disease.  However she currently does not ambulate so she has no claudication no evidence of rest pain or ulcerations.  Based on this we will monitor this but no intervention is necessary.  This is likely not an overarching cause of her edema.  3. Primary hypertension Continue antihypertensive medications as already ordered, these medications have been reviewed and there are no changes at this time.  4. Degeneration of intervertebral disc of lumbosacral region, unspecified whether pain present Currently the patient does not walk.  This likely does contribute significantly to her edema.   Current Outpatient Medications on File Prior to Visit  Medication Sig Dispense Refill  albuterol (VENTOLIN HFA) 108 (90 Base) MCG/ACT inhaler Inhale 2 puffs into the lungs every 4 (four) hours as needed for wheezing or shortness of breath.     divalproex (DEPAKOTE) 500 MG DR tablet Take 500 mg by mouth 2 (two) times daily.     fluticasone (FLONASE) 50 MCG/ACT nasal spray Place 1 spray into both nostrils at bedtime.     gabapentin  (NEURONTIN) 300 MG capsule Take 300 mg by mouth 2 (two) times daily.     montelukast (SINGULAIR) 10 MG tablet Take 10 mg by mouth at bedtime.     ondansetron (ZOFRAN) 4 MG tablet Take 4 mg by mouth every 8 (eight) hours as needed.     oxybutynin (DITROPAN) 5 MG tablet Take 5 mg by mouth daily.     oxyCODONE-acetaminophen (PERCOCET) 7.5-325 MG tablet Take 1 tablet by mouth every 4 (four) hours as needed for severe pain.     pantoprazole (PROTONIX) 20 MG tablet Take 20 mg by mouth 2 (two) times daily before a meal.     polyethylene glycol (MIRALAX / GLYCOLAX) 17 g packet Take 17 g by mouth 2 (two) times daily as needed. Mix and give with 4-8 oz. of fluid 14 each    QUEtiapine (SEROQUEL) 400 MG tablet Take 400 mg by mouth at bedtime.     senna (SENOKOT) 8.6 MG TABS tablet Take 1 tablet by mouth every 12 (twelve) hours.     Tiotropium Bromide Monohydrate (SPIRIVA RESPIMAT) 1.25 MCG/ACT AERS Inhale 2 puffs into the lungs daily.     acetaminophen (TYLENOL) 325 MG tablet Take 650 mg by mouth every 8 (eight) hours as needed. (Patient not taking: Reported on 11/15/2023)     aspirin EC 81 MG tablet Take 81 mg by mouth every evening. Swallow whole. (Patient not taking: Reported on 08/22/2023)     budesonide (PULMICORT) 0.5 MG/2ML nebulizer solution Take 0.5 mg by nebulization daily. (Patient not taking: Reported on 11/15/2023)     Cholecalciferol (VITAMIN D) 50 MCG (2000 UT) tablet Take 2,000 Units by mouth daily. (Patient not taking: Reported on 11/15/2023)     formoterol (PERFOROMIST) 20 MCG/2ML nebulizer solution Take 20 mcg by nebulization 2 (two) times daily. (Patient not taking: Reported on 11/15/2023)     furosemide (LASIX) 20 MG tablet Take 20 mg by mouth daily. (Patient not taking: Reported on 11/15/2023)     Glucagon HCl 1 MG SOLR Inject 1 mg as directed daily as needed (blood sugar below 60 and unable to take oral glucose replacement). (Patient not taking: Reported on 08/22/2023)     insulin aspart  (NOVOLOG) 100 UNIT/ML injection Inject 10 Units into the skin 3 (three) times daily before meals. Hold if blood glucose is below 200 (Patient not taking: Reported on 11/15/2023)     insulin glargine-yfgn (SEMGLEE) 100 UNIT/ML Pen  (Patient not taking: Reported on 11/15/2023)     insulin lispro (HUMALOG) 100 UNIT/ML KwikPen Inject into the skin. (Patient not taking: Reported on 11/15/2023)     ipratropium-albuterol (DUONEB) 0.5-2.5 (3) MG/3ML SOLN Take 3 mLs by nebulization every 6 (six) hours as needed (shortness of breath, coughing or wheezing). (Patient not taking: Reported on 08/22/2023)     levocetirizine (XYZAL) 5 MG tablet Take 5 mg by mouth daily at 12 noon. (Patient not taking: Reported on 11/15/2023)     LORazepam (ATIVAN) 0.5 MG tablet Take 0.5 mg by mouth every 8 (eight) hours. (Patient not taking: Reported on 11/15/2023)     losartan (COZAAR) 50  MG tablet Take 50 mg by mouth daily. (Patient not taking: Reported on 11/15/2023)     Melatonin 5 MG TABS Take 10 mg by mouth at bedtime.  (Patient not taking: Reported on 11/15/2023)     naloxone [facility]) nasal spray 4 mg/0.1 mL Place 1 spray into the nose See admin instructions. Spray 1 spray in nostril every 3 minutes as needed for overdose symptoms (Patient not taking: Reported on 08/22/2023)     Plecanatide (TRULANCE) 3 MG TABS Take 1 tablet by mouth daily. (Patient not taking: Reported on 11/15/2023)     potassium chloride (KLOR-CON) 10 MEQ tablet Take 10 mEq by mouth daily. (Patient not taking: Reported on 11/15/2023)     STIOLTO RESPIMAT 2.5-2.5 MCG/ACT AERS SMARTSIG:2 Puff(s) Via Inhaler Daily (Patient not taking: Reported on 11/15/2023)     tiZANidine (ZANAFLEX) 2 MG tablet Take 2 mg by mouth at bedtime. (Patient not taking: Reported on 11/15/2023)     traZODone (DESYREL) 100 MG tablet Take 200 mg by mouth daily. In the afternoon for depression (Patient not taking: Reported on 11/15/2023)     No current facility-administered medications on file prior  to visit.    There are no Patient Instructions on file for this visit. No follow-ups on file.   Georgiana Spinner, NP

## 2023-12-24 ENCOUNTER — Other Ambulatory Visit (INDEPENDENT_AMBULATORY_CARE_PROVIDER_SITE_OTHER): Payer: Self-pay | Admitting: Nurse Practitioner

## 2023-12-24 DIAGNOSIS — M7989 Other specified soft tissue disorders: Secondary | ICD-10-CM

## 2023-12-25 ENCOUNTER — Ambulatory Visit (INDEPENDENT_AMBULATORY_CARE_PROVIDER_SITE_OTHER): Payer: Medicare Other

## 2023-12-25 ENCOUNTER — Encounter (INDEPENDENT_AMBULATORY_CARE_PROVIDER_SITE_OTHER): Payer: Self-pay | Admitting: Nurse Practitioner

## 2023-12-25 ENCOUNTER — Ambulatory Visit (INDEPENDENT_AMBULATORY_CARE_PROVIDER_SITE_OTHER): Payer: Medicare Other | Admitting: Nurse Practitioner

## 2023-12-25 VITALS — BP 96/67 | HR 75 | Resp 16 | Wt 224.0 lb

## 2023-12-25 DIAGNOSIS — M7989 Other specified soft tissue disorders: Secondary | ICD-10-CM

## 2023-12-25 DIAGNOSIS — M51379 Other intervertebral disc degeneration, lumbosacral region without mention of lumbar back pain or lower extremity pain: Secondary | ICD-10-CM

## 2023-12-25 DIAGNOSIS — I10 Essential (primary) hypertension: Secondary | ICD-10-CM

## 2023-12-25 DIAGNOSIS — I739 Peripheral vascular disease, unspecified: Secondary | ICD-10-CM

## 2023-12-25 DIAGNOSIS — I890 Lymphedema, not elsewhere classified: Secondary | ICD-10-CM | POA: Diagnosis not present

## 2023-12-26 ENCOUNTER — Encounter (INDEPENDENT_AMBULATORY_CARE_PROVIDER_SITE_OTHER): Payer: Self-pay | Admitting: Nurse Practitioner

## 2023-12-26 NOTE — Progress Notes (Signed)
 Subjective:    Patient ID: Gloria Chapman, female    DOB: 24-Jun-1956, 67 y.o.   MRN: [id number] Chief Complaint  Patient presents with   Follow-up    4-6 weeks bilateral venous reflux    Patient is seen for evaluation of leg swelling. The patient first noticed the swelling remotely but is now concerned because of a significant increase in the overall edema. The swelling isn't associated with significant pain.  There has been an increasing amount of  discoloration noted by the patient. The patient notes that in the morning the legs are improved but they steadily worsened throughout the course of the day. Elevation seems to make the swelling of the legs better, dependency makes them much worse.  The patient is mostly wheelchair-bound.  Currently she has no evidence of wounds or weeping but she notes that she has previously had weeping of the lower extremities before.  Since her last visit she has been in [employer] and tolerated these well.  The weeping and swelling she had previously has resolved.   The patient denies any recent changes in their medications.  The patient has not been wearing graduated compression.  The patient has no had any past angiography, interventions or vascular surgery.  The patient denies a history of DVT or PE. There is no prior history of phlebitis. There is no history of primary lymphedema.  There is no history of radiation treatment to the groin or pelvis No history of malignancies. No history of trauma or groin or pelvic surgery. No history of foreign travel or parasitic infections area   Today noninvasive studies show no evidence of DVT or superficial thrombophlebitis.  No evidence of deep venous insufficiency or superficial venous reflux bilaterally.    Review of Systems  Cardiovascular:  Positive for leg swelling.  Skin:  Negative for wound.  Neurological:  Positive for weakness.  All other systems reviewed and are negative.      Objective:    Physical Exam Vitals reviewed.  HENT:     Head: Normocephalic.  Cardiovascular:     Rate and Rhythm: Normal rate.     Pulses:          Dorsalis pedis pulses are 2+ on the right side and 0 on the left side.  Pulmonary:     Effort: Pulmonary effort is normal.  Musculoskeletal:     Right lower leg: No edema.     Left lower leg: Edema present.  Skin:    General: Skin is warm and dry.  Neurological:     Mental Status: She is alert and oriented to person, place, and time.  Psychiatric:        Mood and Affect: Mood normal.        Behavior: Behavior normal.        Thought Content: Thought content normal.        Judgment: Judgment normal.     BP 96/67   Pulse 75   Resp 16   Wt 224 lb (101.6 kg)   BMI 40.97 kg/m   Past Medical History:  Diagnosis Date   Anemia    Anxiety    Arthritis    COPD (chronic obstructive pulmonary disease) (HCC)    Depression    Diabetes mellitus without complication (HCC)    Dyspnea    Fibromyalgia    GERD (gastroesophageal reflux disease)    Headache    Heart failure (HCC)    Heart murmur    Hepatitis  History of Clostridium difficile infection    History of methicillin resistant staphylococcus aureus (MRSA)    Insomnia    Overactive bladder    Pneumonia    PONV (postoperative nausea and vomiting)     Social History   Socioeconomic History   Marital status: Divorced    Spouse name: Not on file   Number of children: Not on file   Years of education: Not on file   Highest education level: Not on file  Occupational History   Not on file  Tobacco Use   Smoking status: Every Day    Current packs/day: 1.00    Average packs/day: 1 pack/day for 50.0 years (50.0 ttl pk-yrs)    Types: Cigarettes   Smokeless tobacco: Never   Tobacco comments:    0.5PPD- 11/17/2020  Vaping Use   Vaping status: Never Used  Substance and Sexual Activity   Alcohol use: No   Drug use: No   Sexual activity: Not Currently    Birth control/protection:  Post-menopausal  Other Topics Concern   Not on file  Social History Narrative   Not on file   Social Drivers of Health   Financial Resource Strain: Low Risk  (09/21/2020)   Overall Financial Resource Strain (CARDIA)    Difficulty of Paying Living Expenses: Not hard at all  Food Insecurity: No Food Insecurity (09/21/2020)   Hunger Vital Sign    Worried About Running Out of Food in the Last Year: Never true    Ran Out of Food in the Last Year: Never true  Transportation Needs: No Transportation Needs (09/21/2020)   PRAPARE - Administrator, Civil Service (Medical): No    Lack of Transportation (Non-Medical): No  Physical Activity: Inactive (09/21/2020)   Exercise Vital Sign    Days of Exercise per Week: 0 days    Minutes of Exercise per Session: 0 min  Stress: No Stress Concern Present (09/21/2020)   [employer] of Occupational Health - Occupational Stress Questionnaire    Feeling of Stress : Only a little  Social Connections: Socially Isolated (09/21/2020)   Social Connection and Isolation Panel [NHANES]    Frequency of Communication with Friends and Family: Twice a week    Frequency of Social Gatherings with Friends and Family: Never    Attends Religious Services: Never    Database administrator or Organizations: No    Attends Banker Meetings: Never    Marital Status: Divorced  Catering manager Violence: Not At Risk (09/21/2020)   Humiliation, Afraid, Rape, and Kick questionnaire    Fear of Current or Ex-Partner: No    Emotionally Abused: No    Physically Abused: No    Sexually Abused: No    Past Surgical History:  Procedure Laterality Date   APPENDECTOMY     BACK SURGERY     twice   CHOLECYSTECTOMY N/A 09/20/2021   Procedure: LAPAROSCOPIC CHOLECYSTECTOMY;  Surgeon: Lucretia Roers, MD;  Location: [facility];  Service: General;  Laterality: N/A;   DIAGNOSTIC LAPAROSCOPY     checking for endometriosis   ESOPHAGOGASTRODUODENOSCOPY (EGD) WITH PROPOFOL N/A  11/21/2017   Dr. Jena Gauss: Erosive reflux esophagitis, low-grade narrowing Schatzki ring status post dilation, small hiatal hernia.   ESOPHAGOGASTRODUODENOSCOPY (EGD) WITH PROPOFOL N/A 07/25/2020   Dr. Jena Gauss: Oral candidiasis, likely early Candida esophagitis, normal stomach and duodenum.  Esophagus dilated due to history of dysphagia.   FEMUR FRACTURE SURGERY Right    Rods placed   HEMORRHOID SURGERY  INNER EAR SURGERY Left    MALONEY DILATION N/A 11/21/2017   Procedure: Elease Hashimoto DILATION;  Surgeon: Corbin Ade, MD;  Location: [facility];  Service: Endoscopy;  Laterality: N/A;   MALONEY DILATION N/A 07/25/2020   Procedure: Elease Hashimoto DILATION;  Surgeon: Corbin Ade, MD;  Location: [facility];  Service: Endoscopy;  Laterality: N/A;   NASAL SINUS SURGERY     right hip surgery     [place]: "took my hip out due to MRSA" occurred after hip replacements   UMBILICAL HERNIA REPAIR N/A 01/01/2022   Procedure: HERNIA REPAIR UMBILICAL ADULT;  Surgeon: Lucretia Roers, MD;  Location: [facility];  Service: General;  Laterality: N/A;    Family History  Problem Relation Age of Onset   Cancer Father        unknown   Cancer Mother    Colon cancer Neg Hx     Allergies  Allergen Reactions   Reglan [Metoclopramide]     Tardive dyskinesia per patient       Latest Ref Rng & Units 11/06/2022    7:35 PM 11/04/2021    3:30 PM 09/15/2021    2:41 PM  CBC  WBC 4.0 - 10.5 K/uL 11.9  13.0  6.6   Hemoglobin 12.0 - 15.0 g/dL 60.4  54.0  98.1   Hematocrit 36.0 - 46.0 % 39.1  43.5  44.2   Platelets 150 - 400 K/uL 233  255  157       CMP     Component Value Date/Time   NA 136 11/06/2022 1935   K 3.7 11/06/2022 1935   CL 100 11/06/2022 1935   CO2 26 11/06/2022 1935   GLUCOSE 156 (H) 11/06/2022 1935   BUN 9 11/06/2022 1935   CREATININE 0.68 11/06/2022 1935   CALCIUM 8.8 (L) 11/06/2022 1935   PROT 6.6 11/06/2022 1935   ALBUMIN 3.5 11/06/2022 1935   AST 15 11/06/2022 1935   ALT 10 11/06/2022  1935   ALKPHOS 62 11/06/2022 1935   BILITOT 0.2 (L) 11/06/2022 1935   GFRNONAA >60 11/06/2022 1935     No results found.     Assessment & Plan:  1. Lymphedema (Primary) Today the patient swelling has resolved to a controllable level.  At this time we will place the patient back in [place] boots but we will get the facility orders to obtain and place compression socks on the patient on a daily basis.  Once they are able to obtain compression socks she can be removed from the [place] boots and utilize compression instead.  She is also advised to try to elevate lower extremities to see how this helps with her swelling as well.  Unfortunately I do believe that her lower extremity weakness also contributes significantly to her swelling as well.  Will have the patient return in 3 months to evaluate progress of swelling and if lymphedema pump may be necessary to help control edema  2. PAD (peripheral artery disease) (HCC) In the patient's referral packet there are studies that indicate that she likely has evidence of aortoiliac level disease.  However she currently does not ambulate so she has no claudication no evidence of rest pain or ulcerations.  She does have diminished pulses on exam today.  Based on this when she returns in the next few months we will have her undergo ABIs. 3. Primary hypertension Continue antihypertensive medications as already ordered, these medications have been reviewed and there are no changes at this time.  4. Degeneration of intervertebral disc of lumbosacral region, unspecified whether pain present Currently the patient does not walk.  This likely does contribute significantly to her edema.     Current Outpatient Medications on File Prior to Visit  Medication Sig Dispense Refill   albuterol (VENTOLIN HFA) 108 (90 Base) MCG/ACT inhaler Inhale 2 puffs into the lungs every 4 (four) hours as needed for wheezing or shortness of breath.     divalproex (DEPAKOTE) 500 MG DR  tablet Take 500 mg by mouth 2 (two) times daily.     fluticasone (FLONASE) 50 MCG/ACT nasal spray Place 1 spray into both nostrils at bedtime.     gabapentin (NEURONTIN) 300 MG capsule Take 300 mg by mouth 2 (two) times daily.     montelukast (SINGULAIR) 10 MG tablet Take 10 mg by mouth at bedtime.     ondansetron (ZOFRAN) 4 MG tablet Take 4 mg by mouth every 8 (eight) hours as needed.     oxybutynin (DITROPAN) 5 MG tablet Take 5 mg by mouth daily.     oxyCODONE-acetaminophen (PERCOCET) 7.5-325 MG tablet Take 1 tablet by mouth every 4 (four) hours as needed for severe pain.     pantoprazole (PROTONIX) 20 MG tablet Take 20 mg by mouth 2 (two) times daily before a meal.     polyethylene glycol (MIRALAX / GLYCOLAX) 17 g packet Take 17 g by mouth 2 (two) times daily as needed. Mix and give with 4-8 oz. of fluid 14 each    QUEtiapine (SEROQUEL) 400 MG tablet Take 400 mg by mouth at bedtime.     senna (SENOKOT) 8.6 MG TABS tablet Take 1 tablet by mouth every 12 (twelve) hours.     Tiotropium Bromide Monohydrate (SPIRIVA RESPIMAT) 1.25 MCG/ACT AERS Inhale 2 puffs into the lungs daily.     acetaminophen (TYLENOL) 325 MG tablet Take 650 mg by mouth every 8 (eight) hours as needed. (Patient not taking: Reported on 03/19/2023)     aspirin EC 81 MG tablet Take 81 mg by mouth every evening. Swallow whole. (Patient not taking: Reported on 08/22/2023)     budesonide (PULMICORT) 0.5 MG/2ML nebulizer solution Take 0.5 mg by nebulization daily. (Patient not taking: Reported on 12/25/2023)     Cholecalciferol (VITAMIN D) 50 MCG (2000 UT) tablet Take 2,000 Units by mouth daily. (Patient not taking: Reported on 11/15/2023)     formoterol (PERFOROMIST) 20 MCG/2ML nebulizer solution Take 20 mcg by nebulization 2 (two) times daily. (Patient not taking: Reported on 12/25/2023)     furosemide (LASIX) 20 MG tablet Take 20 mg by mouth daily. (Patient not taking: Reported on 12/25/2023)     Glucagon HCl 1 MG SOLR Inject 1 mg as directed  daily as needed (blood sugar below 60 and unable to take oral glucose replacement). (Patient not taking: Reported on 08/22/2023)     insulin aspart (NOVOLOG) 100 UNIT/ML injection Inject 10 Units into the skin 3 (three) times daily before meals. Hold if blood glucose is below 200 (Patient not taking: Reported on 11/15/2023)     insulin glargine-yfgn (SEMGLEE) 100 UNIT/ML Pen  (Patient not taking: Reported on 12/25/2023)     insulin lispro (HUMALOG) 100 UNIT/ML KwikPen Inject into the skin. (Patient not taking: Reported on 12/25/2023)     ipratropium-albuterol (DUONEB) 0.5-2.5 (3) MG/3ML SOLN Take 3 mLs by nebulization every 6 (six) hours as needed (shortness of breath, coughing or wheezing). (Patient not taking: Reported on 08/22/2023)     levocetirizine (XYZAL) 5 MG tablet  Take 5 mg by mouth daily at 12 noon. (Patient not taking: Reported on 11/15/2023)     LORazepam (ATIVAN) 0.5 MG tablet Take 0.5 mg by mouth every 8 (eight) hours. (Patient not taking: Reported on 11/15/2023)     losartan (COZAAR) 50 MG tablet Take 50 mg by mouth daily. (Patient not taking: Reported on 12/25/2023)     Melatonin 5 MG TABS Take 10 mg by mouth at bedtime.  (Patient not taking: Reported on 11/15/2023)     naloxone [facility]) nasal spray 4 mg/0.1 mL Place 1 spray into the nose See admin instructions. Spray 1 spray in nostril every 3 minutes as needed for overdose symptoms (Patient not taking: Reported on 08/22/2023)     Plecanatide (TRULANCE) 3 MG TABS Take 1 tablet by mouth daily. (Patient not taking: Reported on 12/25/2023)     potassium chloride (KLOR-CON) 10 MEQ tablet Take 10 mEq by mouth daily. (Patient not taking: Reported on 12/25/2023)     STIOLTO RESPIMAT 2.5-2.5 MCG/ACT AERS SMARTSIG:2 Puff(s) Via Inhaler Daily (Patient not taking: Reported on 12/25/2023)     tiZANidine (ZANAFLEX) 2 MG tablet Take 2 mg by mouth at bedtime. (Patient not taking: Reported on 12/25/2023)     traZODone (DESYREL) 100 MG tablet Take 200 mg by mouth daily.  In the afternoon for depression (Patient not taking: Reported on 11/15/2023)     No current facility-administered medications on file prior to visit.    There are no Patient Instructions on file for this visit. No follow-ups on file.   Georgiana Spinner, NP

## 2024-03-23 ENCOUNTER — Other Ambulatory Visit (INDEPENDENT_AMBULATORY_CARE_PROVIDER_SITE_OTHER): Payer: Self-pay | Admitting: Nurse Practitioner

## 2024-03-23 DIAGNOSIS — I700 Atherosclerosis of aorta: Secondary | ICD-10-CM

## 2024-03-23 DIAGNOSIS — R0989 Other specified symptoms and signs involving the circulatory and respiratory systems: Secondary | ICD-10-CM

## 2024-03-25 ENCOUNTER — Encounter (INDEPENDENT_AMBULATORY_CARE_PROVIDER_SITE_OTHER): Payer: Self-pay | Admitting: Nurse Practitioner

## 2024-03-25 ENCOUNTER — Ambulatory Visit (INDEPENDENT_AMBULATORY_CARE_PROVIDER_SITE_OTHER): Admitting: Nurse Practitioner

## 2024-03-25 ENCOUNTER — Ambulatory Visit (INDEPENDENT_AMBULATORY_CARE_PROVIDER_SITE_OTHER)

## 2024-03-25 VITALS — BP 103/69 | HR 80 | Resp 16

## 2024-03-25 DIAGNOSIS — I10 Essential (primary) hypertension: Secondary | ICD-10-CM

## 2024-03-25 DIAGNOSIS — R0989 Other specified symptoms and signs involving the circulatory and respiratory systems: Secondary | ICD-10-CM | POA: Diagnosis not present

## 2024-03-25 DIAGNOSIS — I890 Lymphedema, not elsewhere classified: Secondary | ICD-10-CM | POA: Diagnosis not present

## 2024-03-25 DIAGNOSIS — I700 Atherosclerosis of aorta: Secondary | ICD-10-CM | POA: Diagnosis not present

## 2024-03-25 DIAGNOSIS — I739 Peripheral vascular disease, unspecified: Secondary | ICD-10-CM | POA: Diagnosis not present

## 2024-03-25 NOTE — Progress Notes (Signed)
 Subjective:    Patient ID: Gloria Chapman, female    DOB: 05-10-1956, 68 y.o.   MRN: [id number] Chief Complaint  Patient presents with   Follow-up    3 month abi    The patient returns today for follow-up evaluation of her lymphedema as well as evaluation of arterial disease.  Today her legs appear much better in terms of wounds and ulcerations.  However she does have a small area where there is some beginning weeping and she notes she scratched herself in that area.  She notes that she has been wearing the Unna boots off and on since we saw her last.  She had an injury to her right foot and this is healing without issue.  She denies any additional open wounds or ulcerations.  Patient is currently wheelchair-bound and does not walk.  She resides at a nursing facility and is dependent on nurse aides for much of her care.  She notes that she does not wish to wear medical grade compression stockings as they are painful plus she requires excessive assistance to wear them.  She also underwent ABIs today in order to evaluate previous studies outside studies which indicated that she had some degree of aortic iliac disease.  Today she had a right ABI 1.02 on the left of 1.10.  She has strong triphasic tibial artery waveforms bilaterally.  She also has normal TBI's bilaterally as well.    Review of Systems  Cardiovascular:  Positive for leg swelling.  Neurological:  Positive for weakness.  All other systems reviewed and are negative.      Objective:   Physical Exam Vitals reviewed.  HENT:     Head: Normocephalic.  Cardiovascular:     Rate and Rhythm: Normal rate.  Pulmonary:     Effort: Pulmonary effort is normal.  Musculoskeletal:     Right lower leg: Edema present.     Left lower leg: Edema present.  Skin:    General: Skin is warm and dry.  Neurological:     Mental Status: She is alert and oriented to person, place, and time.     Motor: Weakness present.  Psychiatric:        Mood  and Affect: Mood normal.        Behavior: Behavior normal.        Thought Content: Thought content normal.        Judgment: Judgment normal.     BP 103/69   Pulse 80   Resp 16   Past Medical History:  Diagnosis Date   Anemia    Anxiety    Arthritis    COPD (chronic obstructive pulmonary disease) (HCC)    Depression    Diabetes mellitus without complication (HCC)    Dyspnea    Fibromyalgia    GERD (gastroesophageal reflux disease)    Headache    Heart failure (HCC)    Heart murmur    Hepatitis    History of Clostridium difficile infection    History of methicillin resistant staphylococcus aureus (MRSA)    Insomnia    Overactive bladder    Pneumonia    PONV (postoperative nausea and vomiting)     Social History   Socioeconomic History   Marital status: Divorced    Spouse name: Not on file   Number of children: Not on file   Years of education: Not on file   Highest education level: Not on file  Occupational History   Not on file  Tobacco Use   Smoking status: Every Day    Current packs/day: 1.00    Average packs/day: 1 pack/day for 50.0 years (50.0 ttl pk-yrs)    Types: Cigarettes   Smokeless tobacco: Never   Tobacco comments:    0.5PPD- 11/17/2020  Vaping Use   Vaping status: Never Used  Substance and Sexual Activity   Alcohol use: No   Drug use: No   Sexual activity: Not Currently    Birth control/protection: Post-menopausal  Other Topics Concern   Not on file  Social History Narrative   Not on file   Social Drivers of Health   Financial Resource Strain: Low Risk  (09/21/2020)   Overall Financial Resource Strain (CARDIA)    Difficulty of Paying Living Expenses: Not hard at all  Food Insecurity: No Food Insecurity (09/21/2020)   Hunger Vital Sign    Worried About Running Out of Food in the Last Year: Never true    Ran Out of Food in the Last Year: Never true  Transportation Needs: No Transportation Needs (09/21/2020)   PRAPARE - Therapist, art (Medical): No    Lack of Transportation (Non-Medical): No  Physical Activity: Inactive (09/21/2020)   Exercise Vital Sign    Days of Exercise per Week: 0 days    Minutes of Exercise per Session: 0 min  Stress: No Stress Concern Present (09/21/2020)   [employer] of Occupational Health - Occupational Stress Questionnaire    Feeling of Stress : Only a little  Social Connections: Socially Isolated (09/21/2020)   Social Connection and Isolation Panel    Frequency of Communication with Friends and Family: Twice a week    Frequency of Social Gatherings with Friends and Family: Never    Attends Religious Services: Never    Database administrator or Organizations: No    Attends Banker Meetings: Never    Marital Status: Divorced  Catering manager Violence: Not At Risk (09/21/2020)   Humiliation, Afraid, Rape, and Kick questionnaire    Fear of Current or Ex-Partner: No    Emotionally Abused: No    Physically Abused: No    Sexually Abused: No    Past Surgical History:  Procedure Laterality Date   APPENDECTOMY     BACK SURGERY     twice   CHOLECYSTECTOMY N/A 09/20/2021   Procedure: LAPAROSCOPIC CHOLECYSTECTOMY;  Surgeon: Kallie Manuelita BROCKS, MD;  Location: [facility];  Service: General;  Laterality: N/A;   DIAGNOSTIC LAPAROSCOPY     checking for endometriosis   ESOPHAGOGASTRODUODENOSCOPY (EGD) WITH PROPOFOL  N/A 11/21/2017   Dr. Shaaron: Erosive reflux esophagitis, low-grade narrowing Schatzki ring status post dilation, small hiatal hernia.   ESOPHAGOGASTRODUODENOSCOPY (EGD) WITH PROPOFOL  N/A 07/25/2020   Dr. Shaaron: Oral candidiasis, likely early Candida esophagitis, normal stomach and duodenum.  Esophagus dilated due to history of dysphagia.   FEMUR FRACTURE SURGERY Right    Rods placed   HEMORRHOID SURGERY     INNER EAR SURGERY Left    MALONEY DILATION N/A 11/21/2017   Procedure: AGAPITO DILATION;  Surgeon: Shaaron Lamar HERO, MD;  Location: [facility];  Service:  Endoscopy;  Laterality: N/A;   MALONEY DILATION N/A 07/25/2020   Procedure: AGAPITO DILATION;  Surgeon: Shaaron Lamar HERO, MD;  Location: [facility];  Service: Endoscopy;  Laterality: N/A;   NASAL SINUS SURGERY     right hip surgery     [place]: took my hip out due to MRSA occurred after hip replacements  UMBILICAL HERNIA REPAIR N/A 01/01/2022   Procedure: HERNIA REPAIR UMBILICAL ADULT;  Surgeon: Kallie Manuelita BROCKS, MD;  Location: [facility];  Service: General;  Laterality: N/A;    Family History  Problem Relation Age of Onset   Cancer Father        unknown   Cancer Mother    Colon cancer Neg Hx     Allergies  Allergen Reactions   Reglan  [Metoclopramide ]     Tardive dyskinesia per patient       Latest Ref Rng & Units 11/06/2022    7:35 PM 11/04/2021    3:30 PM 09/15/2021    2:41 PM  CBC  WBC 4.0 - 10.5 K/uL 11.9  13.0  6.6   Hemoglobin 12.0 - 15.0 g/dL 87.2  85.7  85.3   Hematocrit 36.0 - 46.0 % 39.1  43.5  44.2   Platelets 150 - 400 K/uL 233  255  157       CMP     Component Value Date/Time   NA 136 11/06/2022 1935   K 3.7 11/06/2022 1935   CL 100 11/06/2022 1935   CO2 26 11/06/2022 1935   GLUCOSE 156 (H) 11/06/2022 1935   BUN 9 11/06/2022 1935   CREATININE 0.68 11/06/2022 1935   CALCIUM 8.8 (L) 11/06/2022 1935   PROT 6.6 11/06/2022 1935   ALBUMIN 3.5 11/06/2022 1935   AST 15 11/06/2022 1935   ALT 10 11/06/2022 1935   ALKPHOS 62 11/06/2022 1935   BILITOT 0.2 (L) 11/06/2022 1935   GFRNONAA >60 11/06/2022 1935     No results found.     Assessment & Plan:   1. Lymphedema (Primary) The patient's lymphedema is likely driven by her immobility.  I have told the patient that the greatest way to control her swelling and prevent further blisters and ulcerations it is to utilize medical grade compression.  She is not wanting to utilize traditional compression but she is tolerable with Unna wraps.  I have recommended to her facility that he can alternate weeks on and  off to help control her swelling.  However if she does develop an open wound or ulceration these need to be put in place weekly until her wound is healed.  I think a lymphedema pump would be beneficial for the patient however given her nursing facility, she probably would not be able to utilize it without help and that is difficult for her currently.  She is also advised to elevate her lower extremities but will follow-up with us  in 6 months.  2. PAD (peripheral artery disease) (HCC) Per previous study she has some the level of aortic iliac disease but her ABIs today are normal.  She has suspected aortic iliac disease but this is likely not significant.  Currently no role for intervention  3. Primary hypertension Continue antihypertensive medications as already ordered, these medications have been reviewed and there are no changes at this time.   Current Outpatient Medications on File Prior to Visit  Medication Sig Dispense Refill   albuterol  (VENTOLIN  HFA) 108 (90 Base) MCG/ACT inhaler Inhale 2 puffs into the lungs every 4 (four) hours as needed for wheezing or shortness of breath.     divalproex (DEPAKOTE) 500 MG DR tablet Take 500 mg by mouth 2 (two) times daily.     fluticasone  (FLONASE ) 50 MCG/ACT nasal spray Place 1 spray into both nostrils at bedtime.     gabapentin  (NEURONTIN ) 300 MG capsule Take 300 mg by mouth 2 (two) times  daily.     insulin  lispro (HUMALOG) 100 UNIT/ML KwikPen Inject into the skin.     montelukast  (SINGULAIR ) 10 MG tablet Take 10 mg by mouth at bedtime.     ondansetron  (ZOFRAN ) 4 MG tablet Take 4 mg by mouth every 8 (eight) hours as needed.     oxybutynin  (DITROPAN ) 5 MG tablet Take 5 mg by mouth daily.     oxyCODONE -acetaminophen  (PERCOCET) 7.5-325 MG tablet Take 1 tablet by mouth every 4 (four) hours as needed for severe pain.     pantoprazole  (PROTONIX ) 20 MG tablet Take 20 mg by mouth 2 (two) times daily before a meal.     polyethylene glycol (MIRALAX  / GLYCOLAX )  17 g packet Take 17 g by mouth 2 (two) times daily as needed. Mix and give with 4-8 oz. of fluid 14 each    QUEtiapine  (SEROQUEL ) 400 MG tablet Take 400 mg by mouth at bedtime.     senna (SENOKOT) 8.6 MG TABS tablet Take 1 tablet by mouth every 12 (twelve) hours.     Tiotropium Bromide Monohydrate  (SPIRIVA  RESPIMAT) 1.25 MCG/ACT AERS Inhale 2 puffs into the lungs daily.     acetaminophen  (TYLENOL ) 325 MG tablet Take 650 mg by mouth every 8 (eight) hours as needed. (Patient not taking: Reported on 03/25/2024)     aspirin  EC 81 MG tablet Take 81 mg by mouth every evening. Swallow whole. (Patient not taking: Reported on 03/25/2024)     budesonide (PULMICORT) 0.5 MG/2ML nebulizer solution Take 0.5 mg by nebulization daily. (Patient not taking: Reported on 03/25/2024)     Cholecalciferol (VITAMIN D ) 50 MCG (2000 UT) tablet Take 2,000 Units by mouth daily. (Patient not taking: Reported on 03/25/2024)     formoterol (PERFOROMIST) 20 MCG/2ML nebulizer solution Take 20 mcg by nebulization 2 (two) times daily. (Patient not taking: Reported on 03/25/2024)     furosemide  (LASIX ) 20 MG tablet Take 20 mg by mouth daily. (Patient not taking: Reported on 03/25/2024)     Glucagon HCl 1 MG SOLR Inject 1 mg as directed daily as needed (blood sugar below 60 and unable to take oral glucose replacement). (Patient not taking: Reported on 03/25/2024)     insulin  aspart (NOVOLOG ) 100 UNIT/ML injection Inject 10 Units into the skin 3 (three) times daily before meals. Hold if blood glucose is below 200 (Patient not taking: Reported on 03/25/2024)     insulin  glargine-yfgn (SEMGLEE ) 100 UNIT/ML Pen  (Patient not taking: Reported on 03/25/2024)     ipratropium-albuterol  (DUONEB) 0.5-2.5 (3) MG/3ML SOLN Take 3 mLs by nebulization every 6 (six) hours as needed (shortness of breath, coughing or wheezing). (Patient not taking: Reported on 03/25/2024)     levocetirizine (XYZAL ) 5 MG tablet Take 5 mg by mouth daily at 12 noon. (Patient not taking: Reported  on 03/25/2024)     LORazepam  (ATIVAN ) 0.5 MG tablet Take 0.5 mg by mouth every 8 (eight) hours. (Patient not taking: Reported on 03/25/2024)     losartan (COZAAR) 50 MG tablet Take 50 mg by mouth daily. (Patient not taking: Reported on 03/25/2024)     Melatonin 5 MG TABS Take 10 mg by mouth at bedtime.  (Patient not taking: Reported on 03/25/2024)     naloxone (NARCAN) nasal spray 4 mg/0.1 mL Place 1 spray into the nose See admin instructions. Spray 1 spray in nostril every 3 minutes as needed for overdose symptoms (Patient not taking: Reported on 03/25/2024)     Plecanatide  (TRULANCE ) 3 MG TABS Take 1  tablet by mouth daily. (Patient not taking: Reported on 03/25/2024)     potassium chloride  (KLOR-CON ) 10 MEQ tablet Take 10 mEq by mouth daily. (Patient not taking: Reported on 03/25/2024)     STIOLTO RESPIMAT  2.5-2.5 MCG/ACT AERS SMARTSIG:2 Puff(s) Via Inhaler Daily (Patient not taking: Reported on 03/25/2024)     tiZANidine (ZANAFLEX) 2 MG tablet Take 2 mg by mouth at bedtime. (Patient not taking: Reported on 03/25/2024)     traZODone  (DESYREL ) 100 MG tablet Take 200 mg by mouth daily. In the afternoon for depression (Patient not taking: Reported on 03/25/2024)     No current facility-administered medications on file prior to visit.    There are no Patient Instructions on file for this visit. No follow-ups on file.   Alyxandria Wentz E Sarath Privott, NP

## 2024-03-26 LAB — VAS US ABI WITH/WO TBI
Left ABI: 1.1
Right ABI: 1.02

## 2024-09-25 ENCOUNTER — Ambulatory Visit (INDEPENDENT_AMBULATORY_CARE_PROVIDER_SITE_OTHER): Admitting: Nurse Practitioner

## 2024-10-26 ENCOUNTER — Ambulatory Visit (INDEPENDENT_AMBULATORY_CARE_PROVIDER_SITE_OTHER): Admitting: Nurse Practitioner
